# Patient Record
Sex: Female | Born: 1945 | Race: White | Hispanic: No | Marital: Married | Smoking: Never smoker
Health system: Southern US, Community
[De-identification: ages and names within clinical notes are randomized; demographics above are authoritative.]

## PROBLEM LIST (undated history)

## (undated) ENCOUNTER — Emergency Department (HOSPITAL_COMMUNITY): Admission: EM | Payer: PRIVATE HEALTH INSURANCE | Source: Home / Self Care

## (undated) DIAGNOSIS — E669 Obesity, unspecified: Secondary | ICD-10-CM

## (undated) DIAGNOSIS — I313 Pericardial effusion (noninflammatory): Secondary | ICD-10-CM

## (undated) DIAGNOSIS — R5381 Other malaise: Secondary | ICD-10-CM

## (undated) DIAGNOSIS — I10 Essential (primary) hypertension: Secondary | ICD-10-CM

## (undated) DIAGNOSIS — K219 Gastro-esophageal reflux disease without esophagitis: Secondary | ICD-10-CM

## (undated) DIAGNOSIS — M810 Age-related osteoporosis without current pathological fracture: Secondary | ICD-10-CM

## (undated) DIAGNOSIS — E039 Hypothyroidism, unspecified: Secondary | ICD-10-CM

## (undated) DIAGNOSIS — Z8709 Personal history of other diseases of the respiratory system: Secondary | ICD-10-CM

## (undated) DIAGNOSIS — C439 Malignant melanoma of skin, unspecified: Secondary | ICD-10-CM

## (undated) DIAGNOSIS — G4700 Insomnia, unspecified: Secondary | ICD-10-CM

## (undated) DIAGNOSIS — C801 Malignant (primary) neoplasm, unspecified: Secondary | ICD-10-CM

## (undated) DIAGNOSIS — E785 Hyperlipidemia, unspecified: Secondary | ICD-10-CM

## (undated) DIAGNOSIS — N8500 Endometrial hyperplasia, unspecified: Secondary | ICD-10-CM

## (undated) DIAGNOSIS — Z800 Family history of malignant neoplasm of digestive organs: Secondary | ICD-10-CM

## (undated) DIAGNOSIS — R351 Nocturia: Secondary | ICD-10-CM

## (undated) DIAGNOSIS — M62838 Other muscle spasm: Secondary | ICD-10-CM

## (undated) DIAGNOSIS — J45909 Unspecified asthma, uncomplicated: Secondary | ICD-10-CM

## (undated) DIAGNOSIS — E559 Vitamin D deficiency, unspecified: Secondary | ICD-10-CM

## (undated) DIAGNOSIS — R600 Localized edema: Secondary | ICD-10-CM

## (undated) DIAGNOSIS — M719 Bursopathy, unspecified: Secondary | ICD-10-CM

## (undated) DIAGNOSIS — R531 Weakness: Secondary | ICD-10-CM

## (undated) DIAGNOSIS — R5383 Other fatigue: Secondary | ICD-10-CM

## (undated) DIAGNOSIS — M67919 Unspecified disorder of synovium and tendon, unspecified shoulder: Secondary | ICD-10-CM

## (undated) DIAGNOSIS — J189 Pneumonia, unspecified organism: Secondary | ICD-10-CM

## (undated) DIAGNOSIS — M1990 Unspecified osteoarthritis, unspecified site: Secondary | ICD-10-CM

## (undated) DIAGNOSIS — R609 Edema, unspecified: Secondary | ICD-10-CM

## (undated) HISTORY — PX: JOINT REPLACEMENT: SHX530

## (undated) HISTORY — PX: ESOPHAGOGASTRODUODENOSCOPY: SHX1529

## (undated) HISTORY — DX: Obesity, unspecified: E66.9

## (undated) HISTORY — DX: Vitamin D deficiency, unspecified: E55.9

## (undated) HISTORY — DX: Family history of malignant neoplasm of digestive organs: Z80.0

## (undated) HISTORY — DX: Unspecified asthma, uncomplicated: J45.909

## (undated) HISTORY — DX: Other malaise: R53.81

## (undated) HISTORY — PX: TUBAL LIGATION: SHX77

## (undated) HISTORY — DX: Other fatigue: R53.83

## (undated) HISTORY — DX: Hypothyroidism, unspecified: E03.9

## (undated) HISTORY — PX: COLONOSCOPY: SHX174

## (undated) HISTORY — DX: Bursopathy, unspecified: M67.919

## (undated) HISTORY — DX: Malignant melanoma of skin, unspecified: C43.9

## (undated) HISTORY — DX: Endometrial hyperplasia, unspecified: N85.00

## (undated) HISTORY — DX: Bursopathy, unspecified: M71.9

---

## 2003-12-02 ENCOUNTER — Ambulatory Visit (HOSPITAL_BASED_OUTPATIENT_CLINIC_OR_DEPARTMENT_OTHER): Admission: RE | Admit: 2003-12-02 | Discharge: 2003-12-02 | Payer: Self-pay | Admitting: Internal Medicine

## 2004-05-17 ENCOUNTER — Ambulatory Visit: Payer: Self-pay | Admitting: Internal Medicine

## 2004-06-10 ENCOUNTER — Ambulatory Visit: Payer: Self-pay | Admitting: Obstetrics and Gynecology

## 2004-06-11 ENCOUNTER — Ambulatory Visit: Payer: Self-pay | Admitting: Internal Medicine

## 2004-06-18 ENCOUNTER — Ambulatory Visit: Payer: Self-pay | Admitting: Internal Medicine

## 2004-06-30 HISTORY — PX: MELANOMA EXCISION: SHX5266

## 2004-08-05 ENCOUNTER — Ambulatory Visit: Payer: Self-pay | Admitting: Internal Medicine

## 2004-08-13 ENCOUNTER — Ambulatory Visit: Payer: Self-pay | Admitting: Internal Medicine

## 2005-01-03 ENCOUNTER — Ambulatory Visit: Payer: Self-pay | Admitting: Internal Medicine

## 2005-01-05 ENCOUNTER — Ambulatory Visit (HOSPITAL_COMMUNITY): Admission: RE | Admit: 2005-01-05 | Discharge: 2005-01-05 | Payer: Self-pay | Admitting: Internal Medicine

## 2005-06-13 ENCOUNTER — Ambulatory Visit: Payer: Self-pay | Admitting: Internal Medicine

## 2005-06-16 ENCOUNTER — Ambulatory Visit: Payer: Self-pay | Admitting: Obstetrics and Gynecology

## 2005-06-19 ENCOUNTER — Ambulatory Visit: Payer: Self-pay | Admitting: Internal Medicine

## 2005-10-17 ENCOUNTER — Ambulatory Visit: Payer: Self-pay | Admitting: Internal Medicine

## 2005-10-17 ENCOUNTER — Ambulatory Visit (HOSPITAL_COMMUNITY): Admission: RE | Admit: 2005-10-17 | Discharge: 2005-10-17 | Payer: Self-pay | Admitting: Internal Medicine

## 2006-02-23 ENCOUNTER — Ambulatory Visit: Payer: Self-pay | Admitting: Internal Medicine

## 2006-03-03 ENCOUNTER — Ambulatory Visit (HOSPITAL_COMMUNITY): Admission: RE | Admit: 2006-03-03 | Discharge: 2006-03-03 | Payer: Self-pay | Admitting: Internal Medicine

## 2006-03-03 ENCOUNTER — Ambulatory Visit: Payer: Self-pay | Admitting: Internal Medicine

## 2006-06-30 HISTORY — PX: TUMOR EXCISION: SHX421

## 2006-07-08 ENCOUNTER — Ambulatory Visit: Payer: Self-pay | Admitting: Internal Medicine

## 2006-07-08 LAB — CONVERTED CEMR LAB
ALT: 18 units/L (ref 0–40)
AST: 16 units/L (ref 0–37)
Albumin: 3.6 g/dL (ref 3.5–5.2)
Alkaline Phosphatase: 73 units/L (ref 39–117)
BUN: 11 mg/dL (ref 6–23)
Basophils Relative: 0.9 % (ref 0.0–1.0)
Bilirubin Urine: NEGATIVE
Calcium: 9.4 mg/dL (ref 8.4–10.5)
Chloride: 104 meq/L (ref 96–112)
Chol/HDL Ratio, serum: 5.3
Cholesterol: 232 mg/dL (ref 0–200)
Crystals: NEGATIVE
GFR calc non Af Amer: 67 mL/min
Glomerular Filtration Rate, Af Am: 82 mL/min/{1.73_m2}
Glucose, Bld: 103 mg/dL — ABNORMAL HIGH (ref 70–99)
HCT: 42.7 % (ref 36.0–46.0)
Hemoglobin, Urine: NEGATIVE
MCV: 84 fL (ref 78.0–100.0)
Monocytes Relative: 8.5 % (ref 3.0–11.0)
Potassium: 3.9 meq/L (ref 3.5–5.1)
RBC: 5.08 M/uL (ref 3.87–5.11)
Sodium: 140 meq/L (ref 135–145)
Total Protein, Urine: NEGATIVE mg/dL
Urobilinogen, UA: 0.2 (ref 0.0–1.0)
WBC: 7.3 10*3/uL (ref 4.5–10.5)

## 2006-07-09 ENCOUNTER — Ambulatory Visit: Payer: Self-pay | Admitting: Obstetrics and Gynecology

## 2006-07-09 ENCOUNTER — Encounter: Payer: Self-pay | Admitting: Internal Medicine

## 2006-07-29 ENCOUNTER — Ambulatory Visit: Payer: Self-pay | Admitting: Internal Medicine

## 2006-09-04 ENCOUNTER — Encounter: Payer: Self-pay | Admitting: Internal Medicine

## 2006-09-28 ENCOUNTER — Encounter: Payer: Self-pay | Admitting: Internal Medicine

## 2006-10-13 ENCOUNTER — Ambulatory Visit: Payer: Self-pay | Admitting: Obstetrics and Gynecology

## 2006-10-13 ENCOUNTER — Encounter: Payer: Self-pay | Admitting: Internal Medicine

## 2006-10-13 ENCOUNTER — Other Ambulatory Visit: Payer: Self-pay

## 2006-10-15 ENCOUNTER — Encounter: Payer: Self-pay | Admitting: Internal Medicine

## 2006-10-16 ENCOUNTER — Encounter: Payer: Self-pay | Admitting: Internal Medicine

## 2006-10-19 ENCOUNTER — Ambulatory Visit: Payer: Self-pay | Admitting: Obstetrics and Gynecology

## 2006-10-19 ENCOUNTER — Encounter: Payer: Self-pay | Admitting: Internal Medicine

## 2006-10-27 ENCOUNTER — Encounter: Payer: Self-pay | Admitting: Internal Medicine

## 2007-01-28 ENCOUNTER — Encounter: Payer: Self-pay | Admitting: Internal Medicine

## 2007-07-01 HISTORY — PX: TUMOR REMOVAL: SHX12

## 2007-07-27 ENCOUNTER — Encounter: Payer: Self-pay | Admitting: Internal Medicine

## 2007-07-27 ENCOUNTER — Ambulatory Visit: Payer: Self-pay | Admitting: Internal Medicine

## 2007-07-27 DIAGNOSIS — E039 Hypothyroidism, unspecified: Secondary | ICD-10-CM | POA: Insufficient documentation

## 2007-07-27 DIAGNOSIS — E785 Hyperlipidemia, unspecified: Secondary | ICD-10-CM | POA: Insufficient documentation

## 2007-07-27 DIAGNOSIS — K219 Gastro-esophageal reflux disease without esophagitis: Secondary | ICD-10-CM | POA: Insufficient documentation

## 2007-07-27 DIAGNOSIS — I10 Essential (primary) hypertension: Secondary | ICD-10-CM | POA: Insufficient documentation

## 2007-07-27 LAB — CONVERTED CEMR LAB
ALT: 20 units/L (ref 0–35)
Basophils Relative: 0.3 % (ref 0.0–1.0)
Bilirubin Urine: NEGATIVE
CO2: 28 meq/L (ref 19–32)
Crystals: NEGATIVE
Direct LDL: 199.3 mg/dL
Eosinophils Absolute: 0.1 10*3/uL (ref 0.0–0.6)
GFR calc non Af Amer: 90 mL/min
Glucose, Bld: 105 mg/dL — ABNORMAL HIGH (ref 70–99)
HDL: 40.6 mg/dL (ref >39.0)
Hemoglobin, Urine: NEGATIVE
Ketones, ur: NEGATIVE mg/dL
Lymphocytes Relative: 18.6 % (ref 12.0–46.0)
MCHC: 33.4 g/dL (ref 30.0–36.0)
Monocytes Absolute: 0.8 10*3/uL — ABNORMAL HIGH (ref 0.2–0.7)
Neutro Abs: 6.1 10*3/uL (ref 1.4–7.7)
Neutrophils Relative %: 70.8 % (ref 43.0–77.0)
Nitrite: NEGATIVE
RBC / HPF: NONE SEEN
TSH: 4.97 microintl units/mL (ref 0.35–5.50)
Total Bilirubin: 0.7 mg/dL (ref 0.3–1.2)
Total Protein: 6.8 g/dL (ref 6.0–8.3)
Urine Glucose: NEGATIVE mg/dL
Urobilinogen, UA: 0.2 (ref 0.0–1.0)
WBC: 8.6 10*3/uL (ref 4.5–10.5)

## 2007-07-29 ENCOUNTER — Encounter: Payer: Self-pay | Admitting: Internal Medicine

## 2007-07-29 ENCOUNTER — Ambulatory Visit: Payer: Self-pay | Admitting: Obstetrics and Gynecology

## 2007-08-02 ENCOUNTER — Ambulatory Visit: Payer: Self-pay | Admitting: Internal Medicine

## 2007-09-09 ENCOUNTER — Ambulatory Visit: Payer: Self-pay | Admitting: Internal Medicine

## 2007-09-09 LAB — CONVERTED CEMR LAB
Albumin: 3.5 g/dL (ref 3.5–5.2)
Alkaline Phosphatase: 73 units/L (ref 39–117)
Cholesterol: 136 mg/dL (ref 0–200)
HDL: 34 mg/dL — ABNORMAL LOW (ref >39.0)
Total Bilirubin: 0.6 mg/dL (ref 0.3–1.2)
Total Protein: 7 g/dL (ref 6.0–8.3)
Triglycerides: 103 mg/dL (ref 0–149)

## 2007-09-10 ENCOUNTER — Encounter: Payer: Self-pay | Admitting: Internal Medicine

## 2008-05-01 ENCOUNTER — Telehealth: Payer: Self-pay | Admitting: Internal Medicine

## 2008-06-30 HISTORY — PX: ROTATOR CUFF REPAIR: SHX139

## 2008-07-27 ENCOUNTER — Ambulatory Visit: Payer: Self-pay | Admitting: Internal Medicine

## 2008-07-27 LAB — CONVERTED CEMR LAB
ALT: 22 units/L (ref 0–35)
AST: 17 units/L (ref 0–37)
Albumin: 3.8 g/dL (ref 3.5–5.2)
Basophils Relative: 0.6 % (ref 0.0–3.0)
Bilirubin Urine: NEGATIVE
Bilirubin, Direct: 0.1 mg/dL (ref 0.0–0.3)
CO2: 30 meq/L (ref 19–32)
Chloride: 104 meq/L (ref 96–112)
Creatinine, Ser: 0.8 mg/dL (ref 0.4–1.2)
Eosinophils Absolute: 0.2 10*3/uL (ref 0.0–0.7)
Eosinophils Relative: 1.8 % (ref 0.0–5.0)
GFR calc Af Amer: 93 mL/min
HDL: 45.7 mg/dL (ref >39.0)
Hemoglobin, Urine: NEGATIVE
Hemoglobin: 13.4 g/dL (ref 12.0–15.0)
MCHC: 34.5 g/dL (ref 30.0–36.0)
Mucus, UA: NEGATIVE
Neutrophils Relative %: 75.9 % (ref 43.0–77.0)
Platelets: 269 10*3/uL (ref 150–400)
Potassium: 4.4 meq/L (ref 3.5–5.1)
RDW: 12.2 % (ref 11.5–14.6)
Total Protein: 7 g/dL (ref 6.0–8.3)
Triglycerides: 76 mg/dL (ref 0–149)
Urine Glucose: NEGATIVE mg/dL
Urobilinogen, UA: 0.2 (ref 0.0–1.0)
VLDL: 15 mg/dL (ref 0–40)

## 2008-07-28 ENCOUNTER — Ambulatory Visit: Payer: Self-pay | Admitting: Obstetrics and Gynecology

## 2008-08-03 ENCOUNTER — Ambulatory Visit: Payer: Self-pay | Admitting: Internal Medicine

## 2008-10-11 ENCOUNTER — Telehealth: Payer: Self-pay | Admitting: Internal Medicine

## 2008-10-18 ENCOUNTER — Encounter: Payer: Self-pay | Admitting: Internal Medicine

## 2008-11-09 ENCOUNTER — Encounter: Payer: Self-pay | Admitting: Internal Medicine

## 2008-11-16 ENCOUNTER — Encounter: Payer: Self-pay | Admitting: Internal Medicine

## 2008-12-14 ENCOUNTER — Encounter: Payer: Self-pay | Admitting: Internal Medicine

## 2009-01-11 ENCOUNTER — Encounter: Payer: Self-pay | Admitting: Internal Medicine

## 2009-02-08 ENCOUNTER — Encounter: Payer: Self-pay | Admitting: Internal Medicine

## 2009-02-21 ENCOUNTER — Telehealth: Payer: Self-pay | Admitting: Internal Medicine

## 2009-04-17 ENCOUNTER — Encounter: Payer: Self-pay | Admitting: Internal Medicine

## 2009-05-01 ENCOUNTER — Encounter: Payer: Self-pay | Admitting: Internal Medicine

## 2009-05-21 ENCOUNTER — Ambulatory Visit: Payer: Self-pay | Admitting: Internal Medicine

## 2009-05-21 DIAGNOSIS — C439 Malignant melanoma of skin, unspecified: Secondary | ICD-10-CM | POA: Insufficient documentation

## 2009-05-21 DIAGNOSIS — M719 Bursopathy, unspecified: Secondary | ICD-10-CM

## 2009-05-21 DIAGNOSIS — M67919 Unspecified disorder of synovium and tendon, unspecified shoulder: Secondary | ICD-10-CM | POA: Insufficient documentation

## 2009-05-21 DIAGNOSIS — N8500 Endometrial hyperplasia, unspecified: Secondary | ICD-10-CM | POA: Insufficient documentation

## 2009-05-21 DIAGNOSIS — E559 Vitamin D deficiency, unspecified: Secondary | ICD-10-CM | POA: Insufficient documentation

## 2009-05-21 LAB — CONVERTED CEMR LAB
ALT: 20 units/L (ref 0–35)
AST: 20 units/L (ref 0–37)
Albumin: 4.3 g/dL (ref 3.5–5.2)
Basophils Absolute: 0 10*3/uL (ref 0.0–0.1)
CO2: 29 meq/L (ref 19–32)
Calcium, Total (PTH): 9.4 mg/dL (ref 8.4–10.5)
Calcium: 8.9 mg/dL (ref 8.4–10.5)
Chloride: 101 meq/L (ref 96–112)
Cholesterol: 164 mg/dL (ref 0–200)
Free T4: 0.7 ng/dL (ref 0.6–1.6)
GFR calc non Af Amer: 89.12 mL/min (ref >60)
Glucose, Bld: 85 mg/dL (ref 70–99)
HCT: 42.4 % (ref 36.0–46.0)
Lymphs Abs: 1.3 10*3/uL (ref 0.7–4.0)
MCHC: 34.4 g/dL (ref 30.0–36.0)
Neutro Abs: 6.4 10*3/uL (ref 1.4–7.7)
PTH: 46.8 pg/mL (ref 14.0–72.0)
RBC: 4.92 M/uL (ref 3.87–5.11)
RDW: 13 % (ref 11.5–14.6)
Total CHOL/HDL Ratio: 3
VLDL: 14.8 mg/dL (ref 0.0–40.0)
Vitamin B-12: 506 pg/mL (ref 211–911)

## 2009-05-28 ENCOUNTER — Telehealth: Payer: Self-pay | Admitting: Internal Medicine

## 2009-07-19 ENCOUNTER — Telehealth (INDEPENDENT_AMBULATORY_CARE_PROVIDER_SITE_OTHER): Payer: Self-pay | Admitting: *Deleted

## 2009-07-30 ENCOUNTER — Ambulatory Visit: Payer: Medicare Other | Admitting: Obstetrics and Gynecology

## 2009-07-31 ENCOUNTER — Ambulatory Visit: Payer: Self-pay | Admitting: Internal Medicine

## 2009-07-31 LAB — CONVERTED CEMR LAB
ALT: 22 units/L (ref 0–35)
Amylase: 44 units/L (ref 27–131)
BUN: 14 mg/dL (ref 6–23)
Basophils Absolute: 0 10*3/uL (ref 0.0–0.1)
Basophils Relative: 0.1 % (ref 0.0–3.0)
Bilirubin Urine: NEGATIVE
CO2: 27 meq/L (ref 19–32)
Calcium: 9.2 mg/dL (ref 8.4–10.5)
Chloride: 109 meq/L (ref 96–112)
Eosinophils Absolute: 0.2 10*3/uL (ref 0.0–0.7)
GFR calc non Af Amer: 106.41 mL/min (ref >60)
Glucose, Bld: 93 mg/dL (ref 70–99)
HCT: 42.5 % (ref 36.0–46.0)
Hemoglobin: 13.8 g/dL (ref 12.0–15.0)
Ketones, ur: NEGATIVE mg/dL
LDL Cholesterol: 80 mg/dL (ref 0–99)
Lymphocytes Relative: 16.8 % (ref 12.0–46.0)
MCHC: 32.4 g/dL (ref 30.0–36.0)
Neutro Abs: 5 10*3/uL (ref 1.4–7.7)
Neutrophils Relative %: 70.7 % (ref 43.0–77.0)
RBC: 4.92 M/uL (ref 3.87–5.11)
Specific Gravity, Urine: 1.02 (ref 1.000–1.030)
Total CK: 47 units/L (ref 7–177)
Triglycerides: 84 mg/dL (ref 0.0–149.0)
VLDL: 16.8 mg/dL (ref 0.0–40.0)

## 2009-08-01 ENCOUNTER — Encounter: Payer: Self-pay | Admitting: Internal Medicine

## 2009-08-07 ENCOUNTER — Ambulatory Visit: Payer: Self-pay | Admitting: Internal Medicine

## 2009-08-07 DIAGNOSIS — IMO0001 Reserved for inherently not codable concepts without codable children: Secondary | ICD-10-CM | POA: Insufficient documentation

## 2010-01-08 ENCOUNTER — Ambulatory Visit: Payer: Self-pay | Admitting: Internal Medicine

## 2010-01-08 DIAGNOSIS — I6529 Occlusion and stenosis of unspecified carotid artery: Secondary | ICD-10-CM | POA: Insufficient documentation

## 2010-01-09 ENCOUNTER — Telehealth (INDEPENDENT_AMBULATORY_CARE_PROVIDER_SITE_OTHER): Payer: Self-pay | Admitting: *Deleted

## 2010-01-15 ENCOUNTER — Encounter: Payer: Self-pay | Admitting: Internal Medicine

## 2010-01-17 ENCOUNTER — Encounter: Payer: Self-pay | Admitting: Internal Medicine

## 2010-01-18 ENCOUNTER — Ambulatory Visit: Payer: Self-pay

## 2010-01-18 ENCOUNTER — Encounter: Payer: Self-pay | Admitting: Internal Medicine

## 2010-01-18 ENCOUNTER — Telehealth: Payer: Self-pay | Admitting: Internal Medicine

## 2010-02-27 ENCOUNTER — Telehealth: Payer: Self-pay | Admitting: Internal Medicine

## 2010-06-30 HISTORY — PX: KNEE ARTHROSCOPY: SUR90

## 2010-08-01 NOTE — Progress Notes (Signed)
  Phone Note Other Incoming   Caller: Pt  Summary of Call: pt states cymbalta is working really well for her. Can I call this into pharm for pt. Please Advise. samples given at last ov Initial call taken by: Ami Bullins CMA,  January 18, 2010 11:37 AM  Follow-up for Phone Call        yes, cymbalta 30mg  1 by mouth once daily , #30, refill as needed.  Follow-up by: Jacques Navy MD,  January 18, 2010 2:59 PM    Prescriptions: CYMBALTA 30 MG CPEP (DULOXETINE HCL) 1 by mouth qd  #30 x 3   Entered by:   Bill Salinas CMA   Authorized by:   Jacques Navy MD   Signed by:   Bill Salinas CMA on 01/18/2010   Method used:   Electronically to        [facility].* (retail)       [address]       [place], [place]  [address]       Ph: [phone]       Fax: [phone]   RxID:   [id number]

## 2010-08-01 NOTE — Miscellaneous (Signed)
Summary: Orders Update  Clinical Lists Changes  Orders: Added new Test order of Carotid Duplex (Carotid Duplex) - Signed 

## 2010-08-01 NOTE — Assessment & Plan Note (Signed)
Summary: CPX /NWS  #   Vital Signs:  Patient profile:   65 year old female Height:      60 inches Weight:      166 pounds BMI:     32.54 O2 Sat:      97 % on Room air Temp:     97.8 degrees F oral Pulse rate:   58 / minute BP sitting:   142 / 86  (left arm) Cuff size:   regular  Vitals Entered By: Bill Salinas CMA (August 07, 2009 1:38 PM)  O2 Flow:  Room air  Vision Screening:      Vision Comments: pt had last eye exam 2 years ago  Vision Entered By: Ami Bullins CMA (August 07, 2009 1:40 PM)   Primary Care Provider:  [facility]   History of Present Illness: Presents for routine follow-up. She does report that she is having more diffuse aches and pains. She has pain in the soft tissue of the  back actross the thoracic area and in the low back.   She has  been prescribed bisphosphonate for bone loss.   She continues to have significant fatigue and malaise. Previous work up was negative and recent labs were normal.   She was unable to take phentermine due to rise in  Blood Pressure  She has seen her gynecologist and is current.   Current Medications (verified): 1)  Taztia Xt 360 Mg Xr24h-Cap (Diltiazem Hcl Er Beads) .[name].. 1 By Mouth Once Daily 2)  Temazepam 30 Mg Caps (Temazepam) .... Take 1 Tab By Mouth At Bedtime 3)  Enalapril Maleate 20 Mg  Tabs (Enalapril Maleate) .[name].. 1 By Mouth Once Daily 4)  Lasix 40 Mg  Tabs (Furosemide) .... Take 1/2 By Mouth Once Daily 5)  Zantac 150 Mg  Tabs (Ranitidine Hcl) .... As Needed 6)  Simvastatin 40 Mg  Tabs (Simvastatin) .[name].. 1 By Mouth Q Pm 7)  Excedrin Tension Headache 500-65 Mg Tabs (Acetaminophen-Caffeine) .... As Needed 8)  Meloxicam 7.5 Mg Tabs (Meloxicam) .[name].. 1 Tab Daily 9)  Centrum Silver  Tabs (Multiple Vitamins-Minerals) .[name].. 1 Daily 10)  Vitamin C 250 Mg Chew (Ascorbic Acid) .[name].. 1 Daily 11)  Vitamin E 100 Unit Caps (Vitamin E) .[name].. 1 Cap Daily 12)  Phentermine Hcl 37.5 Mg Caps (Phentermine Hcl) .[name].. 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) Hx of ROTATOR CUFF INJURY, LEFT SHOULDER (ICD-726.10) VITAMIN D DEFICIENCY (ICD-268.9) OTHER MALAISE AND FATIGUE (ICD-780.79) HYPOTHYROIDISM (ICD-244.9) MELANOMA (ICD-172.9) CARCINOMA, COLON, FAMILY HX (ICD-V16.0) HYPERTENSION (ICD-401.9) HYPERLIPIDEMIA (ICD-272.4) GERD (ICD-530.81) ROUTINE GENERAL MEDICAL EXAM@HEALTH  CARE FACL (ICD-V70.0)  Past Surgical History: Last updated: 05/21/2009 Tubal ligation Excision of a melanoma near the scapula '06 Excision of a small lesion in her right proimal upper extremity that was benign EGD (08/13/2004) excision of endometrial tumor '08 rotator cuff left - repair '10  Family History: Last updated: 08-29-2007 father - deceased @ 7: CHF, emphysema mother- deceased @ 24; HTN DJD 2 sister - deceased: heart disease in one, one of drug overdose; both had severe                 arthitis pain. 2 sisters - HTN, arthritis, one sister with thyroid problems brother - HTN, lipids GM- MI, paternal uncle with MI Strong history of CAD in paternal kinship. Negative for breast cancer, ovarian cancer.  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  Risk Factors: Caffeine Use: 0 (08/02/2007) Exercise: no (08/02/2007)  Risk Factors: Smoking Status: never (07/27/2007)  Review of Systems  The patient denies anorexia, fever, weight loss, weight gain, hoarseness, chest pain, dyspnea on exertion, peripheral edema, headaches, abdominal pain, hematochezia, incontinence, muscle weakness, suspicious skin lesions, depression, abnormal bleeding, enlarged lymph nodes, and angioedema.    Physical Exam  General:  overweight white female Head:   normocephalic, atraumatic, and no abnormalities observed.   Eyes:  vision grossly intact, pupils equal, pupils round, corneas and lenses clear, and no injection.   Ears:  External ear exam shows no significant lesions or deformities.  Otoscopic examination reveals clear canals, tympanic membranes are intact bilaterally without bulging, retraction, inflammation or discharge. Hearing is grossly normal bilaterally. Nose:  no external deformity and no external erythema.   Mouth:  Oral mucosa and oropharynx without lesions or exudates.  Teeth in good repair. Neck:  no thyromegaly and no carotid bruits.   Chest Wall:  no deformities.  absent point tenderness back and chest that would clinically indicate fibromyalgia Breasts:  deferred to gyn Lungs:  Normal respiratory effort, chest expands symmetrically. Lungs are clear to auscultation, no crackles or wheezes. Heart:  Normal rate and regular rhythm. S1 and S2 normal without gallop, murmur, click, rub or other extra sounds. Abdomen:  obese, soft, non-tender, normal bowel sounds, no rebound tenderness, and no hepatomegaly.   Genitalia:  deferred to gyn Msk:  normal ROM, no joint swelling, no joint warmth, no redness over joints, no joint deformities, and no joint instability.   Pulses:  2+ radial and DP pulses Extremities:  No clubbing, cyanosis, edema, or deformity noted with normal full range of motion of all joints.   Neurologic:  No cranial nerve deficits noted. Station and gait are normal. Plantar reflexes are down-going bilaterally. DTRs are symmetrical throughout. Sensory, motor and coordinative functions appear intact. Skin:  turgor normal, color normal, no rashes, no ecchymoses, and no ulcerations.   Cervical Nodes:  no anterior cervical adenopathy and no posterior cervical adenopathy.   Psych:  Oriented X3, memory intact for recent and remote, good eye contact, and not anxious appearing.     Impression & Recommendations:  Problem # 1:   VITAMIN D DEFICIENCY (ICD-268.9) Patient has had replacement therapy.   Recommend - daily intake of [phone] international units Vit D  Problem # 2:  Hx of ROTATOR CUFF INJURY, LEFT SHOULDER (ICD-726.10) Has made an 80% recovery - still with residual limitation in flexion. Does not appear to be limiting activities.   Problem # 3:  HYPOTHYROIDISM (ICD-244.9) No laboratory evidence of hhpothyroidism or need for medication. this would not be an explanation for her generalized weakness and diffuse musculat discomfort.  Problem # 4:  MELANOMA (ICD-172.9) s/p excision of melanoma. She has had recent full body skin exam by her [place] dermatologist.  Problem # 5:  HYPERTENSION (ICD-401.9)  Her updated medication list for this problem includes:    Taztia Xt 360 Mg Xr24h-cap (Diltiazem hcl er beads) .[name]... 1 by mouth once daily    Enalapril Maleate 20 Mg Tabs (Enalapril maleate) .[name]... 1 by mouth once daily    Lasix 40 Mg Tabs (Furosemide) .[name]... Take 1/2 by mouth once daily  BP today: 142/86 Prior BP: 144/90 (05/21/2009)  Borderline control on her three drug regimen.  Plan - home monitoring with report back. If SBP continues to run over 140 a majority of time will need to adjust medications.  Problem # 6:  HYPERLIPIDEMIA (ICD-272.4)  Her updated medication list for this problem includes:    Simvastatin 40 Mg Tabs (Simvastatin) .[name]... 1 by mouth q pm  Lipid panel reveals good control on simvastatin.   Plan - continue medication.   Problem # 7:  MYALGIA (ICD-729.1)  Persistent malaise and fatigue with normal exams and lab. Also with difuse muscle pain ,including lower extremities. she reports that at times she is too weak to complete household chores. Recent lab work up, including thyroid function was normal. Total creatinine Kinase (CK) was 47. Exam was negative for characteristic distribution of tenderness associated with fibromyalgia.  Plan - d/c meloxicam as being ineffective            trial of gabapentin - starting at low dose and titrating up to at least 400 - 600 mg a day           OK for limited small doses of hydorcodone/APAP 5/325 1/2 to 1 tab as needed  The following medications were removed from the medication list:    Meloxicam 7.5 Mg Tabs (Meloxicam) .[name]... 1 tab daily Her updated medication list for this problem includes:    Excedrin Tension Headache 500-65 Mg Tabs (Acetaminophen-caffeine) .[name]... As needed  Orders: Prescription Created Electronically [phone])  Problem # 8:  Preventive Health Care (ICD-V70.0) Lab results generally normal. Exam remarkable for weight but otherwise normal.  She is current with her gynecologist. She is current with breast and colorectal cancer screening. Her immunizations are brought up to date today with tetnus and pneumonia vaccine.  Weight management - a major problem: plan - smart food choice in existing food preferences (take the low fat option); portion size control - reducing total daily calorie intake; gentle regular exercise. Target weight - 130 lbs; GOAL to loose 1-2 lbs/month = 24-36 months to goal.  In summary - a very nice woman whose main issues are fatigue and malaise, weight management. Her other medical problems are stable. She is asked to return n 3-4 months for follow-up of active issues.  Complete Medication List: 1)  Taztia Xt 360 Mg Xr24h-cap (Diltiazem hcl er beads) .[name].. 1 by mouth once daily 2)  Temazepam 30 Mg Caps (Temazepam) .... Take 1 tab by mouth at bedtime 3)  Enalapril Maleate 20 Mg Tabs (Enalapril maleate) .[name].. 1 by mouth once daily 4)  Lasix 40 Mg Tabs (Furosemide) .... Take 1/2 by mouth once daily 5)  Zantac 150 Mg Tabs (Ranitidine hcl) .... As needed 6)  Simvastatin 40 Mg Tabs (Simvastatin) .[name].. 1 by mouth q pm 7)  Excedrin Tension Headache 500-65 Mg Tabs (Acetaminophen-caffeine) .... As needed 8)  Centrum Silver Tabs (Multiple vitamins-minerals) .[name].. 1 daily 9)  Vitamin C 250 Mg Chew (Ascorbic  acid) .[name].. 1 daily 10)  Vitamin E 100 Unit Caps (Vitamin e) .[name].. 1 cap daily 11)  Gabapentin 100 Mg Caps (Gabapentin) .... 100mg  once daily x 3, 100mg  two times a day x 3, 2x100mg  two times a day.  Other Orders: TD Toxoids IM 7 YR + ([address]) Pneumococcal Vaccine (30865) Admin 1st Vaccine (78469) Admin of Any Addtl Vaccine (62952)  Patient: Michelle [name] Note: All result statuses are Final unless otherwise noted.  Tests: (1) BMP (METABOL)   Sodium  143 mEq/L                   135-145   Potassium                 4.6 mEq/L                   3.5-5.1   Chloride                  109 mEq/L                   96-112   Carbon Dioxide            27 mEq/L                    19-32   Glucose                   93 mg/dL                    16-10   BUN                       14 mg/dL                    9-60   Creatinine                0.6 mg/dL                   4.5-4.0   Calcium                   9.2 mg/dL                   9.8-11.9   GFR                       106.41 mL/min               >60  Tests: (2) Lipid Panel (LIPID)   Cholesterol               140 mg/dL                   1-478     ATP III Classification            Desirable:  < 200 mg/dL                    Borderline High:  200 - 239 mg/dL               High:  > = 240 mg/dL   Triglycerides             84.0 mg/dL                  2.9-562.1     Normal:  <150 mg/dL     Borderline High:  308 - 199 mg/dL   HDL                       65.78 mg/dL                 >46.96   VLDL Cholesterol          16.8 mg/dL                  2.9-52.8   LDL Cholesterol  80 mg/dL                    1-61  CHO/HDL Ratio:  CHD Risk                             3                    Men          Women     1/2 Average Risk     3.4          3.3     Average Risk          5.0          4.4     2X Average Risk          9.6          7.1     3X Average Risk          15.0          11.0                           Tests: (3) CBC Platelet w/Diff (CBCD)    White Cell Count          7.1 K/uL                    4.5-10.5   Red Cell Count            4.92 Mil/uL                 3.87-5.11   Hemoglobin                13.8 g/dL                   09.6-04.5   Hematocrit                42.5 %                      36.0-46.0   MCV                       86.4 fl                     78.0-100.0   MCHC                      32.4 g/dL                   40.9-81.1   RDW                       12.6 %                      11.5-14.6   Platelet Count            276.0 K/uL                  150.0-400.0   Neutrophil %              70.7 %                      43.0-77.0   Lymphocyte %  16.8 %                      12.0-46.0   Monocyte %                9.9 %                       3.0-12.0   Eosinophils%              2.5 %                       0.0-5.0   Basophils %               0.1 %                       0.0-3.0   Neutrophill Absolute      5.0 K/uL                    1.4-7.7   Lymphocyte Absolute       1.2 K/uL                    0.7-4.0   Monocyte Absolute         0.7 K/uL                    0.1-1.0  Eosinophils, Absolute                             0.2 K/uL                    0.0-0.7   Basophils Absolute        0.0 K/uL                    0.0-0.1  Tests: (4) Hepatic/Liver Function Panel (HEPATIC)   Total Bilirubin           0.5 mg/dL                   1.6-1.0   Direct Bilirubin          0.1 mg/dL                   9.6-0.4   Alkaline Phosphatase      63 U/L                      39-117   AST                       18 U/L                      0-37   ALT                       22 U/L                      0-35   Total Protein             7.0 g/dL                    5.4-0.9   Albumin  3.8 g/dL                    1.6-1.0  Tests: (5) TSH (TSH)   FastTSH                   2.58 uIU/mL                 0.35-5.50  Tests: (6) Creatine Kinase (CK)   Creatine Kinase           47 U/L                      7-177  Tests: (7) UDip w/Micro (URINE)   Color                      LT. YELLOW       RANGE:  Yellow;Lt. Yellow   Clarity                   CLEAR                       Clear   Specific Gravity          1.020                       1.000 - 1.030   Urine Ph                  6.0                         5.0-8.0   Protein                   NEGATIVE                    Negative   Urine Glucose             NEGATIVE                    Negative   Ketones                   NEGATIVE                    Negative   Urine Bilirubin           NEGATIVE                    Negative   Blood                     NEGATIVE                    Negative   Urobilinogen              0.2                         0.0 - 1.0   Leukocyte Esterace        MODERATE                    Negative   Nitrite                   NEGATIVE  Negative   Urine WBC                 11-20/hpf                   0-2/hpf     Results faxed to site/floor on 07/31/2009 2:30 PM by Luellen Pucker.   Urine Epith               Many(>10/hpf)               Rare(0-4/hpf)   Urine Bacteria            Few(10-50/hpf)              None Tests: (1) Amylase (AMYL)   Amylase                   44 U/L                      27-131  Tests: (2) Lipase (LIPASE)   Lipase                    30.0 U/L                    11.0-59.0Prescriptions: TEMAZEPAM 30 MG CAPS (TEMAZEPAM) Take 1 tab by mouth at bedtime  #30 x 5   Entered and Authorized by:   Jacques Navy MD   Signed by:   Jacques Navy MD on 08/07/2009   Method used:   Handwritten   RxID:   [id number] SIMVASTATIN 40 MG  TABS (SIMVASTATIN) 1 by mouth q PM  #90 x 3   Entered and Authorized by:   Jacques Navy MD   Signed by:   Jacques Navy MD on 08/07/2009   Method used:   Electronically to        [facility].* (retail)       [address]       [place], [place]  [address]       Ph: [phone]       Fax: [phone]   RxID:   [id number] ZANTAC 150 MG  TABS (RANITIDINE HCL) as needed   #180 x 3   Entered and Authorized by:   Jacques Navy MD   Signed by:   Jacques Navy MD on 08/07/2009   Method used:   Electronically to        [facility].* (retail)       [address]       [place], [place]  [address]       Ph: [phone]       Fax: [phone]   RxID:   [id number] LASIX 40 MG  TABS (FUROSEMIDE) take 1/2 by mouth once daily  #90 x 3   Entered and Authorized by:   Jacques Navy MD   Signed by:   Jacques Navy MD on 08/07/2009   Method used:   Electronically to        [facility].* (retail)       [address]       [place], [place]  [address]       Ph: [phone]       Fax: [phone]   RxID:  [id number] ENALAPRIL MALEATE 20 MG  TABS (ENALAPRIL MALEATE) 1 by mouth once daily  #90 x 3   Entered and Authorized by:   Jacques Navy MD   Signed by:   Jacques Navy MD on 08/07/2009   Method used:   Electronically to        [facility].* (retail)       [address]       [place], [place]  [address]       Ph: [phone]       Fax: [phone]   RxID:   [id number] TAZTIA XT 360 MG XR24H-CAP (DILTIAZEM HCL ER BEADS) 1 by mouth once daily  #90 x 3   Entered and Authorized by:   Jacques Navy MD   Signed by:   Jacques Navy MD on 08/07/2009   Method used:   Electronically to        [facility] (retail)       [address]       [place], [place]  [address]       Ph: [phone]       Fax: [phone]   RxID:   [id number] GABAPENTIN 100 MG CAPS (GABAPENTIN) 100mg  once daily x 3, 100mg  two times a day x 3, 2x100mg  two times a day.  #60 x 0   Entered and Authorized by:   Jacques Navy MD   Signed by:   Jacques Navy MD on 08/07/2009   Method used:   Electronically to        [facility].* (retail)       [address]       [place], [place]  [address]       Ph: [phone]       Fax: [phone]   RxID:   [id number]    Preventive Care Screening  Mammogram:    Date:  07/30/2009    Results:  normal   Last Flu Shot:    Date:  04/03/2009    Results:  given     Immunizations Administered:  Tetanus Vaccine:    Vaccine Type: Td    Site: right deltoid    Mfr: Sanofi Pasteur    Dose: 0.5 ml    Route: IM    Given by: Ami Bullins CMA    Exp. Date: 10/11/2010    Lot #: [id number]    VIS given: 05/18/07 version given August 07, 2009.  Pneumonia Vaccine:    Vaccine Type: Pneumovax    Site: left deltoid    Mfr: Merck    Dose: 0.5 ml    Route: IM    Given by: Ami Bullins CMA    Exp. Date: 07/27/2010    Lot #: [id number]    VIS given: 01/26/96 version given August 07, 2009.

## 2010-08-01 NOTE — Letter (Signed)
[facility]  [facility]   Imported By: Lester Dovray 08/09/2009 09:03:40  _____________________________________________________________________  External Attachment:    Type:   Image     Comment:   External Document

## 2010-08-01 NOTE — Assessment & Plan Note (Signed)
Summary: rx consult and knot on right thumb-lb   Vital Signs:  Patient profile:   65 year old female Height:      60 inches Weight:      168 pounds BMI:     32.93 O2 Sat:      97 % on Room air Temp:     98.1 degrees F oral Pulse rate:   67 / minute BP sitting:   138 / 88  (left arm) Cuff size:   regular  Vitals Entered By: Bill Salinas CMA (January 08, 2010 3:54 PM)  O2 Flow:  Room air CC: pt here for evaluation of knot near her right thumb and pt would also like to discuss alternative to Gabapentin/ ab   Primary Care Provider:  Divit Stipp  CC:  pt here for evaluation of knot near her right thumb and pt would also like to discuss alternative to Gabapentin/ ab.  History of Present Illness: Pt presents for evaluation of sa nodule at the base of her right thumb. This has enlarged and causes discomfort when she grips.  She c/o feeling terrible: low energy, anhedonia, short-temper. She also has diffuse discomfort.  Current Medications (verified): 1)  Taztia Xt 360 Mg Xr24h-Cap (Diltiazem Hcl Er Beads) .[name].. 1 By Mouth Once Daily 2)  Temazepam 30 Mg Caps (Temazepam) .... Take 1 Tab By Mouth At Bedtime 3)  Enalapril Maleate 20 Mg  Tabs (Enalapril Maleate) .[name].. 1 By Mouth Once Daily 4)  Lasix 40 Mg  Tabs (Furosemide) .... Take 1/2 By Mouth Once Daily 5)  Zantac 150 Mg  Tabs (Ranitidine Hcl) .... As Needed 6)  Simvastatin 40 Mg  Tabs (Simvastatin) .[name].. 1 By Mouth Q Pm 7)  Excedrin Tension Headache 500-65 Mg Tabs (Acetaminophen-Caffeine) .... As Needed 8)  Centrum Silver  Tabs (Multiple Vitamins-Minerals) .[name].. 1 Daily 9)  Vitamin C 250 Mg Chew (Ascorbic Acid) .[name].. 1 Daily 10)  Vitamin E 100 Unit Caps (Vitamin E) .[name].. 1 Cap Daily 11)  Gabapentin 100 Mg Caps (Gabapentin) .... 100mg  Once Daily X 3, 100mg  Two Times A Day X 3, 2x100mg  Two Times A Day. 12)  Alendronate Sodium 70 Mg Tabs (Alendronate Sodium) .[name].. 1 Q Week 13)  Mimvey 1-0.5 Mg Tabs (Estradiol-Norethindrone Acet) .[name].. 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) Hx of ROTATOR CUFF INJURY, LEFT SHOULDER (ICD-726.10) VITAMIN D DEFICIENCY (ICD-268.9) OTHER MALAISE AND FATIGUE (ICD-780.79) HYPOTHYROIDISM (ICD-244.9) MELANOMA (ICD-172.9) CARCINOMA, COLON, FAMILY HX (ICD-V16.0) HYPERTENSION (ICD-401.9) HYPERLIPIDEMIA (ICD-272.4) GERD (ICD-530.81) ROUTINE GENERAL MEDICAL EXAM@HEALTH  CARE FACL (ICD-V70.0)  Past Surgical History: Last updated: 05/21/2009 Tubal ligation Excision of a melanoma near the scapula '06 Excision of a small lesion in her right proimal upper extremity that was benign EGD (08/13/2004) excision of endometrial tumor '08 rotator cuff left - repair '10  Family History: Last updated: 08-04-2007 father - deceased @ 30: CHF, emphysema mother- deceased @ 37; HTN DJD 2 sister - deceased: heart disease in one, one of drug overdose; both had severe                 arthitis pain. 2 sisters - HTN, arthritis, one sister with thyroid problems brother - HTN, lipids GM- MI, paternal uncle with MI Strong history of CAD in paternal kinship. Negative for breast cancer, ovarian cancer.  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  Review of Systems  The patient denies anorexia, fever, weight loss, weight gain, hoarseness, chest pain, syncope, peripheral edema, prolonged cough, headaches, abdominal pain, hematochezia, hematuria, muscle weakness, and difficulty walking.    Physical Exam  General:  overweight white female in no acute distress Head:  normocephalic and atraumatic.   Eyes:  pupils equal and pupils round.   Neck:  supple.   Chest Wall:  no point tenderness along the back or chest wall. Lungs:  normal respiratory  effort and normal breath sounds.   Heart:  normal rate and regular rhythm.   Abdomen:  obese Msk:  1cm cystic structure at the base of the right thumb. No hot.It is mobile and mildly tender to pressure. Neurologic:  alert & oriented X3 and cranial nerves II-XII intact.   Skin:  turgor normal and color normal.   Psych:  normally interactive, good eye contact, and slightly anxious.     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  Orders: Orthopedic Surgeon Referral (Ortho Surgeon)  Problem # 2:  CAROTID ARTERY DISEASE (ICD-433.10) Life scan with positive doppler.  Plan - definitive doppler carotids  Orders: Cardiology Referral (Cardiology)  Problem # 3:  MYALGIA (ICD-729.1) On-going problem - question of fibromyalgia or depression.  Plan - trail of cymbalta 30mg , may increase to 60mg  if helpful but not quite enough.  Her updated medication list for this problem includes:    Excedrin Tension Headache 500-65 Mg Tabs (Acetaminophen-caffeine) .[name]... As needed    Hydrocodone-acetaminophen 5-325 Mg Tabs (Hydrocodone-acetaminophen) .[name]... Take as needed.  Complete Medication List: 1)  Taztia Xt 360 Mg Xr24h-cap (Diltiazem hcl er beads) .[name].. 1 by mouth once daily 2)  Temazepam 30 Mg Caps (Temazepam) .... Take 1 tab by mouth at bedtime 3)  Enalapril Maleate 20 Mg Tabs (Enalapril maleate) .[name].. 1 by mouth once daily 4)  Lasix 40 Mg Tabs (Furosemide) .... Take 1/2 by mouth once daily 5)  Zantac 150 Mg Tabs (Ranitidine hcl) .... As needed 6)  Simvastatin 40 Mg Tabs (Simvastatin) .[name].. 1 by mouth q pm 7)  Excedrin Tension Headache 500-65 Mg Tabs (Acetaminophen-caffeine) .... As needed 8)  Centrum Silver Tabs (Multiple vitamins-minerals) .[name].. 1 daily 9)  Vitamin C 250 Mg Chew (Ascorbic acid) .[name].. 1 daily 10)  Vitamin E 100 Unit Caps (Vitamin e) .[name].. 1 cap  daily 11)  Alendronate Sodium 70 Mg Tabs (Alendronate sodium) .[name].. 1 q week 12)  Mimvey 1-0.5 Mg Tabs (Estradiol-norethindrone acet) .[name].. 1 once daily 13)  Glucosamine-chondroitin 500-400 Mg Caps (Glucosamine-chondroitin) .... 2 by mouth once daily for joints 14)  Cymbalta 30 Mg Cpep (Duloxetine hcl) .[name].. 1 by mouth qd 15)  Hydrocodone-acetaminophen 5-325 Mg Tabs (Hydrocodone-acetaminophen) .... Take as needed.  Patient Instructions: 1)  Synovial cyst base of right thumb - plan is [facility] ( Drs Merlyn Lot, Sypher and [place]). 2)  Carotid artery disease - due to the "Life-Scan" report will have you get a formal carotid doppler study at [facility] 3)  Fatigue and muscle pain - reviewed labs back to Nov '10 - everything, including B12, normal. Plan - trial of cymbalta 30mg  once a day. If this helps but not enough we can increase to 60mg  once a day. this is for possible Fibromyalgia.  4)  General pain - ok to take up to 3000mg  per day of tylenol; ok to take up to 6 anacin daily; ok to use hydrocodone/APAP 5/325 as needed. CAUTION; this combination includes tylenol and would be part of the 3000mg  limit per day.  Prescriptions: HYDROCODONE-ACETAMINOPHEN 5-325 MG TABS (HYDROCODONE-ACETAMINOPHEN) take as needed.  #60 x 1   Entered and Authorized by:   Jacques Navy MD   Signed by:   Jacques Navy MD on 01/08/2010   Method used:   Print then Give to Patient   RxID:   [id number]

## 2010-08-01 NOTE — Progress Notes (Signed)
Summary: [facility] refills  Phone Note Refill Request Message from:  Fax from Pharmacy on February 27, 2010 4:02 PM  Refills Requested: Medication #1:  ENALAPRIL MALEATE 20 MG  TABS 1 by mouth once daily   Supply Requested: 90  Medication #2:  TEMAZEPAM 30 MG CAPS Take 1 tab by mouth at bedtime   Supply Requested: 90  Medication #3:  TAZTIA XT 360 MG XR24H-CAP 1 by mouth once daily   Supply Requested: 90 90 days supply requested---[facility]. Please advise.  Initial call taken by: Lucious Groves CMA,  February 27, 2010 4:02 PM  Follow-up for Phone Call        OK for refill as needed  Follow-up by: Jacques Navy MD,  February 28, 2010 8:02 AM    Prescriptions: TEMAZEPAM 30 MG CAPS (TEMAZEPAM) Take 1 tab by mouth at bedtime  #90 x 3   Entered by:   Ami Bullins CMA   Authorized by:   Jacques Navy MD   Signed by:   Bill Salinas CMA on 03/05/2010   Method used:   Printed then faxed to ...       [facility] [address]* (retail)       [address]       [place], [place]  [address]       Ph: [phone]       Fax: [phone]   RxID:   [id number] TAZTIA XT 360 MG XR24H-CAP (DILTIAZEM HCL ER BEADS) 1 by mouth once daily  #90 x 3   Entered by:   Ami Bullins CMA   Authorized by:   Jacques Navy MD   Signed by:   Bill Salinas CMA on 03/05/2010   Method used:   Printed then faxed to ...       [facility] [address]* (retail)       [address]       [place], [place]  [address]       Ph: [phone]       Fax: [phone]   RxID:   [id number]

## 2010-08-01 NOTE — Consult Note (Signed)
[facility]: [facility]   Imported By: Lester Charlotte Hall 01/28/2010 09:42:57  _____________________________________________________________________  External Attachment:    Type:   Image     Comment:   External Document

## 2010-08-01 NOTE — Progress Notes (Signed)
  Phone Note Call from Patient   Summary of Call: Is coming Feb 8 for a physical and would like to know if a muscle enzyme can be added to labs? Please advise? Can leave message on answering machine at [phone]. Initial call taken by: Josph Macho CMA,  July 19, 2009 9:30 AM  Follow-up for Phone Call        OK for CK total  995.20 in addition to her routine labs Follow-up by: Jacques Navy MD,  July 19, 2009 12:48 PM  Additional Follow-up for Phone Call Additional follow up Details #1::        Added lab and informed pt that this would be checked. Additional Follow-up by: Josph Macho CMA,  July 19, 2009 1:10 PM

## 2010-08-01 NOTE — Progress Notes (Signed)
----   Converted from flag ---- ---- 01/09/2010 12:46 PM, Edman Circle wrote: appt 7/22 @ 4:00  ---- 01/09/2010 12:34 PM, Dagoberto Reef wrote: Thanks  ---- 01/09/2010 8:07 AM, Dagoberto Reef wrote:   ---- 01/08/2010 4:54 PM, Jacques Navy MD wrote: The following orders have been entered for this patient and placed on Admin Hold:  Type:     Referral       Code:   Ortho Surgeon Description:   Orthopedic Surgeon Referral Order Date:   01/08/2010   Authorized By:   Jacques Navy MD Order #:   [id number] Clinical Notes:   [facility] to [facility] for evaluation and treatment of a synovial cyst base of right thumb - interfering with patients use of her right hand. ------------------------------

## 2010-08-06 ENCOUNTER — Other Ambulatory Visit: Payer: Self-pay

## 2010-08-12 ENCOUNTER — Ambulatory Visit: Payer: Medicare Other | Admitting: Obstetrics and Gynecology

## 2010-08-14 ENCOUNTER — Other Ambulatory Visit: Payer: Self-pay | Admitting: Internal Medicine

## 2010-08-14 ENCOUNTER — Encounter (INDEPENDENT_AMBULATORY_CARE_PROVIDER_SITE_OTHER): Payer: Self-pay | Admitting: *Deleted

## 2010-08-14 ENCOUNTER — Other Ambulatory Visit: Payer: PRIVATE HEALTH INSURANCE

## 2010-08-14 DIAGNOSIS — Z0000 Encounter for general adult medical examination without abnormal findings: Secondary | ICD-10-CM

## 2010-08-14 DIAGNOSIS — E785 Hyperlipidemia, unspecified: Secondary | ICD-10-CM

## 2010-08-14 LAB — BASIC METABOLIC PANEL
Creatinine, Ser: 0.8 mg/dL (ref 0.4–1.2)
GFR: 80.75 mL/min (ref >60.00)
Sodium: 141 mEq/L (ref 135–145)

## 2010-08-14 LAB — CBC WITH DIFFERENTIAL/PLATELET
Basophils Relative: 0.2 % (ref 0.0–3.0)
Eosinophils Absolute: 0 10*3/uL (ref 0.0–0.7)
Eosinophils Relative: 0.1 % (ref 0.0–5.0)
HCT: 42.2 % (ref 36.0–46.0)
Hemoglobin: 14.2 g/dL (ref 12.0–15.0)
Lymphocytes Relative: 15 % (ref 12.0–46.0)
Lymphs Abs: 2.4 10*3/uL (ref 0.7–4.0)
MCV: 84.7 fl (ref 78.0–100.0)
Monocytes Absolute: 1.1 10*3/uL — ABNORMAL HIGH (ref 0.1–1.0)
Neutro Abs: 12.2 10*3/uL — ABNORMAL HIGH (ref 1.4–7.7)
Platelets: 336 10*3/uL (ref 150.0–400.0)
RBC: 4.99 Mil/uL (ref 3.87–5.11)
WBC: 15.7 10*3/uL — ABNORMAL HIGH (ref 4.5–10.5)

## 2010-08-14 LAB — HEPATIC FUNCTION PANEL
AST: 21 U/L (ref 0–37)
Albumin: 4.1 g/dL (ref 3.5–5.2)
Bilirubin, Direct: 0.1 mg/dL (ref 0.0–0.3)

## 2010-08-14 LAB — LIPID PANEL
Cholesterol: 284 mg/dL — ABNORMAL HIGH (ref 0–200)
HDL: 78.9 mg/dL (ref >39.00)
Total CHOL/HDL Ratio: 4
Triglycerides: 76 mg/dL (ref 0.0–149.0)

## 2010-08-14 LAB — URINALYSIS
Bilirubin Urine: NEGATIVE
Ketones, ur: NEGATIVE
Urine Glucose: NEGATIVE
pH: 6.5 (ref 5.0–8.0)

## 2010-08-14 LAB — TSH: TSH: 2.23 u[IU]/mL (ref 0.35–5.50)

## 2010-08-20 ENCOUNTER — Encounter: Payer: Self-pay | Admitting: Internal Medicine

## 2010-08-20 ENCOUNTER — Encounter (INDEPENDENT_AMBULATORY_CARE_PROVIDER_SITE_OTHER): Payer: PRIVATE HEALTH INSURANCE | Admitting: Internal Medicine

## 2010-08-20 DIAGNOSIS — E785 Hyperlipidemia, unspecified: Secondary | ICD-10-CM

## 2010-08-20 DIAGNOSIS — Z23 Encounter for immunization: Secondary | ICD-10-CM

## 2010-08-20 DIAGNOSIS — Z2911 Encounter for prophylactic immunotherapy for respiratory syncytial virus (RSV): Secondary | ICD-10-CM

## 2010-08-20 DIAGNOSIS — I10 Essential (primary) hypertension: Secondary | ICD-10-CM

## 2010-08-20 DIAGNOSIS — Z0000 Encounter for general adult medical examination without abnormal findings: Secondary | ICD-10-CM

## 2010-08-20 DIAGNOSIS — IMO0001 Reserved for inherently not codable concepts without codable children: Secondary | ICD-10-CM

## 2010-08-20 DIAGNOSIS — I6529 Occlusion and stenosis of unspecified carotid artery: Secondary | ICD-10-CM

## 2010-08-27 NOTE — Assessment & Plan Note (Signed)
Summary: CPX BCBS  #  CD   Vital Signs:  Patient profile:   65 year old female Height:      60 inches Weight:      172 pounds BMI:     33.71 O2 Sat:      97 % on Room air Temp:     97.4 degrees F oral Pulse rate:   63 / minute BP sitting:   110 / 62  (left arm) Cuff size:   regular  Vitals Entered By: Bill Salinas CMA (August 20, 2010 1:44 PM)  O2 Flow:  Room air CC: pt here for cpx, pt states she no longer takes mimvey/ ab  Vision Screening:      Vision Comments: eye exam due   Primary Care Provider:  Genavie [name]  CC:  pt here for cpx and pt states she no longer takes mimvey/ ab.  History of Present Illness: [name] presents for routine medical follow-up. she has had bronchitis since returning for [place]. She was started on prednisone burst and taper, and proair inhaler. Her wheezing and coughing has improved.   Fibromyalgia - pain is much better on cymbalta.  She has not been taking her simvastatin - cholesterol is very high.  She has been to her gynecologist: normal exam, normal breast exam, normal mammogram, normal bone density study was normal. Vit D was low and she is on a second round of replacement dosing. she did not take maintenance dosing after her first round of replacement.   Has been to dermatologist for routine exam - no suspicious lesions.   She is 100% independent in all ADLs. No falls are reported and there is no increased fall risk. Cognitive function remains normal as she interacts with family and community. No significant active depression.   Preventive Screening-Counseling & Management  Alcohol-Tobacco     Alcohol drinks/day: <1     Alcohol type: wine, beer     Smoking Status: never  Caffeine-Diet-Exercise     Caffeine use/day: 2 cups per day     Does Patient Exercise: no  Hep-HIV-STD-Contraception     Hepatitis Risk: no risk noted     HIV Risk: no risk noted     STD Risk: no risk noted     Dental Visit-last 6 months yes     Sun  Exposure-Excessive: no  Safety-Violence-Falls     Seat Belt Use: yes     Helmet Use: n/a     Firearms in the Home: firearms in the home     Smoke Detectors: yes     Violence in the Home: no risk noted     Sexual Abuse: no     Fall Risk: low fall risk      Sexual History:  currently monogamous.        Drug Use:  never.        Blood Transfusions:  no.    Current Medications (verified): 1)  Taztia Xt 360 Mg Xr24h-Cap (Diltiazem Hcl Er Beads) .[name].. 1 By Mouth Once Daily 2)  Temazepam 30 Mg Caps (Temazepam) .... Take 1 Tab By Mouth At Bedtime 3)  Enalapril Maleate 20 Mg  Tabs (Enalapril Maleate) .[name].. 1 By Mouth Once Daily 4)  Lasix 40 Mg  Tabs (Furosemide) .... Take 1/2 By Mouth Once Daily 5)  Zantac 150 Mg  Tabs (Ranitidine Hcl) .... As Needed 6)  Simvastatin 40 Mg  Tabs (Simvastatin) .[name].. 1 By Mouth Q Pm 7)  Excedrin Tension Headache 500-65 Mg Tabs (  Acetaminophen-Caffeine) .... As Needed 8)  Centrum Silver  Tabs (Multiple Vitamins-Minerals) .[name].. 1 Daily 9)  Vitamin C 250 Mg Chew (Ascorbic Acid) .[name].. 1 Daily 10)  Vitamin E 100 Unit Caps (Vitamin E) .[name].. 1 Cap Daily 11)  Alendronate Sodium 70 Mg Tabs (Alendronate Sodium) .[name].. 1 Q Week 12)  Mimvey 1-0.5 Mg Tabs (Estradiol-Norethindrone Acet) .[name].. 1 Once Daily 13)  Glucosamine-Chondroitin 500-400 Mg Caps (Glucosamine-Chondroitin) .... 2 By Mouth Once Daily For Joints 14)  Cymbalta 30 Mg Cpep (Duloxetine Hcl) .[name].. 1 By Mouth Qd 15)  Hydrocodone-Acetaminophen 5-325 Mg Tabs (Hydrocodone-Acetaminophen) .... Take As Needed.  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 father - deceased @ 15: CHF, emphysema mother- deceased @ 40; HTN DJD 2 sister - deceased: heart disease in one, one of drug overdose; both had severe                 arthitis pain. 2 sisters - HTN, arthritis, one sister with thyroid problems brother - HTN, lipids GM- MI, paternal uncle with MI Strong history of CAD in paternal kinship. Negative for breast cancer, ovarian cancer.  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  Caffeine use/day:  2 cups per day Dental Care w/in 6 mos.:  yes Sun Exposure-Excessive:  no Seat Belt Use:  yes Fall Risk:  low fall risk Blood Transfusions:  no Hepatitis Risk:  no risk noted HIV Risk:  no risk noted STD Risk:  no risk noted Sexual History:  currently monogamous Drug Use:  never  Review of Systems  The patient denies fever, weight loss, weight gain, vision loss, decreased hearing, dyspnea on exertion, headaches, abdominal pain, severe indigestion/heartburn, muscle weakness, suspicious skin lesions, difficulty walking, unusual weight change, abnormal bleeding, angioedema, and breast masses.    Physical Exam  General:  overweight white female Head:  Normocephalic and atraumatic without obvious abnormalities. No apparent alopecia or balding. Eyes:  vision grossly intact, pupils equal, pupils round, corneas and lenses clear, and no injection.   Ears:  External ear exam shows no significant lesions or deformities.  Otoscopic examination reveals clear canals, tympanic membranes are intact bilaterally without bulging, retraction, inflammation or discharge. Hearing is grossly normal bilaterally. Nose:  no external deformity and no external erythema.   Mouth:  Oral mucosa and oropharynx without lesions or  exudates.  Teeth in good repair. Neck:  supple, full ROM, no thyromegaly, and no carotid bruits.   Chest Wall:  no deformities and no tenderness.   Breasts:  deferred to gyn Lungs:  Normal respiratory effort, chest expands symmetrically. Lungs are clear to auscultation, no crackles or wheezes. Heart:  Normal rate and regular rhythm. S1 and S2 normal without gallop, murmur, click, rub or other extra sounds. Abdomen:  soft, non-tender, normal bowel sounds, no guarding, and no hepatomegaly.   Genitalia:  deferred to gyn Msk:  normal ROM, no joint tenderness, no joint swelling, no joint warmth, no redness over joints, and no joint instability.   Pulses:  2+ radial and DP pulses Extremities:  No clubbing, cyanosis, edema, or deformity noted with normal full range of motion of all joints.   Neurologic:  alert & oriented X3, cranial nerves II-XII intact, strength normal in all extremities, gait normal, and DTRs symmetrical and normal.   Skin:  turgor normal, color normal, no suspicious lesions, and no ulcerations.   Cervical Nodes:  no anterior cervical adenopathy and no posterior cervical adenopathy.   Psych:  Oriented X3, memory intact for recent and remote, normally interactive, and good eye contact.     Impression & Recommendations:  Problem # 1:  CAROTID ARTERY DISEASE (ICD-433.10)  EMR searched with no recent carotid doppler.  Plan - will schedule carotid doppler for follow-up.  Orders: Cardiology Referral (Cardiology)  Problem # 2:  Hx of ROTATOR CUFF INJURY, LEFT SHOULDER (ICD-726.10) Doing fairly well with adequate range of motion to carry out all activities of daily living  Problem # 3:  VITAMIN D DEFICIENCY (ICD-268.9) She has been followed by her gynecologist. She is completing a second course of Vit D replacement at 50,000 units per week x 12 weeks.  Plan - she is advised that she should take vit D [phone] international units daily after completion of replacement             for recurrent Vit D deficiency she is advised to seek an endocrine consult for potential underlying cause for deficiency  Problem # 4:  HYPOTHYROIDISM (ICD-244.9)  Labs Reviewed: TSH: 2.23 (08/14/2010)     Good control on present dose of replacement  Problem # 5:  MELANOMA (ICD-172.9) No recurrence. she has recently seen her dermatologist for a full body exam.  Problem # 6:  HYPERTENSION (ICD-401.9)  Her updated medication list for this problem includes:    Taztia Xt 360 Mg Xr24h-cap (Diltiazem hcl er beads) .[name]... 1 by mouth once daily    Enalapril Maleate 20 Mg Tabs (Enalapril maleate) .[name]... 1 by mouth once daily    Lasix 40 Mg Tabs (Furosemide) .[name]... Take 1/2 by mouth once daily  BP today: 110/62 Prior BP: 138/88 (01/08/2010)  Labs Reviewed: K+: 4.8 (08/14/2010) Creat: : 0.8 (08/14/2010)  Excellent control. Will continue present medications.  Problem # 7:  HYPERLIPIDEMIA (ICD-272.4)  Her updated medication list for this problem includes:    Simvastatin 40 Mg Tabs (Simvastatin) .[name]... 1 by mouth q pm  Labs Reviewed: SGOT: 21 (08/14/2010)   SGPT: 29 (08/14/2010)   HDL:78.90 (08/14/2010), 43.40 (07/31/2009)  LDL: 195 (08/14/10) 80 (07/31/2009), 100 (05/21/2009)  Chol:284 (08/14/2010), 140 (07/31/2009)  Trig:76.0 (08/14/2010), 84.0 (07/31/2009)  OUT OF CONTROL. Discussed the importance of risk reduction for coronary and vascular disease. She admits to not taking her medications, and when she stopped she did not see an improvement in her muscle pain.  Plan - resume medication on daily basis.           follow-up lab   Problem # 8:  MYALGIA (ICD-729.1) Patient with fibromyalgia that is well controlled by cymbalta and nighttime hydorcodone.  Plan - continue present medications.  Her updated medication list for this problem includes:    Excedrin Tension Headache 500-65 Mg Tabs (Acetaminophen-caffeine) .[name]... As needed    Hydrocodone-acetaminophen 5-325 Mg Tabs  (Hydrocodone-acetaminophen) .[name]... Take as needed.  Problem # 9:  Preventive Health Care (ICD-V70.0) Interval medical history is unremarkable. Limited exam is normal. She is current with her gynecologist. She is current with mammography. Last colonoscopy  Feb '06. Last mammogram Feb /12. Immunizations: Tetnus Feb '11; Pneumonia vaccine Feb '11;  shingles vaccine today. 12 Lead EKG without signs of ischemia or injury.  In summary - a very nice woman who is medically stable and doing fairly well. she does need to take her antilipemic medication. She is advised to increase her regular exercise. She will return for  follow-up lab. Otherwise, she will return as needed or 1 year.   Complete Medication List: 1)  Taztia Xt 360 Mg Xr24h-cap (Diltiazem hcl er beads) .[name].. 1 by mouth once daily 2)  Temazepam 30 Mg Caps (Temazepam) .... Take 1 tab by mouth at bedtime 3)  Enalapril Maleate 20 Mg Tabs (Enalapril maleate) .[name].. 1 by mouth once daily 4)  Lasix 40 Mg Tabs (Furosemide) .... Take 1/2 by mouth once daily 5)  Zantac 150 Mg Tabs (Ranitidine hcl) .... As needed 6)  Simvastatin 40 Mg Tabs (Simvastatin) .[name].. 1 by mouth q pm 7)  Excedrin Tension Headache 500-65 Mg Tabs (Acetaminophen-caffeine) .... As needed 8)  Centrum Silver Tabs (Multiple vitamins-minerals) .[name].. 1 daily 9)  Vitamin C 250 Mg Chew (Ascorbic acid) .[name].. 1 daily 10)  Vitamin E 100 Unit Caps (Vitamin e) .[name].. 1 cap daily 11)  Alendronate Sodium 70 Mg Tabs (Alendronate sodium) .[name].. 1 q week 12)  Mimvey 1-0.5 Mg Tabs (Estradiol-norethindrone acet) .[name].. 1 once daily 13)  Glucosamine-chondroitin 500-400 Mg Caps (Glucosamine-chondroitin) .... 2 by mouth once daily for joints 14)  Cymbalta 30 Mg Cpep (Duloxetine hcl) .[name].. 1 by mouth qd 15)  Hydrocodone-acetaminophen 5-325 Mg Tabs (Hydrocodone-acetaminophen) .... Take as needed.  Other Orders: Zoster (Shingles) Vaccine Live [phone]) Admin 1st Vaccine (98119) Medicare -1st Annual Wellness Visit  [phone])  Patient: Michelle [name] Note: All result statuses are Final unless otherwise noted.  Tests: (1) Lipid Panel (LIPID)   Cholesterol          [H]  284 mg/dL                   9-562     ATP III Classification            Desirable:  < 200 mg/dL                    Borderline High:  200 - 239 mg/dL               High:  > = 240 mg/dL   Triglycerides             76.0 mg/dL                  1.3-086.5     Normal:  <150 mg/dL     Borderline High:  784 - 199 mg/dL   HDL                       69.62 mg/dL                 >95.28   VLDL Cholesterol          15.2 mg/dL                  4.1-32.4  CHO/HDL Ratio:  CHD Risk                             4                    Men          Women  1/2 Average Risk     3.4          3.3     Average Risk          5.0          4.4     2X Average Risk          9.6          7.1     3X Average Risk          15.0          11.0                           Tests: (2) BMP (METABOL)   Sodium                    141 mEq/L                   135-145   Potassium                 4.8 mEq/L                   3.5-5.1   Chloride                  102 mEq/L                   96-112   Carbon Dioxide            32 mEq/L                    19-32   Glucose                   79 mg/dL                    16-10   BUN                       20 mg/dL                    9-60   Creatinine                0.8 mg/dL                   4.5-4.0   Calcium                   9.7 mg/dL                   9.8-11.9   GFR                       80.75 mL/min                >60.00  Tests: (3) CBC Platelet w/Diff (CBCD)   White Cell Count     [H]  15.7 K/uL                   4.5-10.5   Red Cell Count            4.99 Mil/uL                 3.87-5.11   Hemoglobin                14.2 g/dL  12.0-15.0   Hematocrit                42.2 %                      36.0-46.0   MCV                       84.7 fl                     78.0-100.0   MCHC                      33.7 g/dL                    65.7-84.6   RDW                       14.1 %                      11.5-14.6   Platelet Count            336.0 K/uL                  150.0-400.0   Neutrophil %         [H]  77.9 %                      43.0-77.0   Lymphocyte %              15.0 %                      12.0-46.0   Monocyte %                6.8 %                       3.0-12.0   Eosinophils%              0.1 %                       0.0-5.0   Basophils %               0.2 %                       0.0-3.0   Neutrophill Absolute [H]  12.2 K/uL                   1.4-7.7   Lymphocyte Absolute       2.4 K/uL                    0.7-4.0   Monocyte Absolute    [H]  1.1 K/uL                    0.1-1.0  Eosinophils, Absolute                             0.0 K/uL                    0.0-0.7   Basophils Absolute        0.0 K/uL  0.0-0.1  Tests: (4) Hepatic/Liver Function Panel (HEPATIC)   Total Bilirubin           0.6 mg/dL                   2.7-2.5   Direct Bilirubin          0.1 mg/dL                   3.6-6.4   Alkaline Phosphatase      87 U/L                      39-117   AST                       21 U/L                      0-37   ALT                       29 U/L                      0-35   Total Protein             7.4 g/dL                    4.0-3.4   Albumin                   4.1 g/dL                    7.4-2.5  Tests: (5) TSH (TSH)   FastTSH                   2.23 uIU/mL                 0.35-5.50  Tests: (6) UDip Only (UDIP)   Color                     LT. YELLOW       RANGE:  Yellow;Lt. Yellow   Clarity                   CLEAR                       Clear   Specific Gravity          1.010                       1.000 - 1.030   Urine Ph                  6.5                         5.0-8.0   Protein                   NEGATIVE                    Negative   Urine Glucose             NEGATIVE                    Negative   Ketones  NEGATIVE                    Negative   Urine Bilirubin            NEGATIVE                    Negative   Blood                     NEGATIVE                    Negative   Urobilinogen              0.2                         0.0 - 1.0   Leukocyte Esterace        NEGATIVE                    Negative   Nitrite                   NEGATIVE                    Negative  Tests: (7) Cholesterol LDL - Direct (DIRLDL)  Cholesterol LDL - Direct                             195.0 mg/dL     Optimal:  <161 mg/dL     Near or Above Optimal:  100-129 mg/dL     Borderline High:  096-045 mg/dL     High:  409-811 mg/dL     Very High:  >914 mg/dLPrescriptions: HYDROCODONE-ACETAMINOPHEN 5-325 MG TABS (HYDROCODONE-ACETAMINOPHEN) take as needed.  #60 x 1   Entered by:   Bill Salinas CMA   Authorized by:   Jacques Navy MD   Signed by:   Bill Salinas CMA on 08/20/2010   Method used:   Telephoned to ...       [facility] [address]* (retail)       [address]       [place], [place]  [address]       Ph: [phone]       Fax: [phone]   RxID:   [id number] TEMAZEPAM 30 MG CAPS (TEMAZEPAM) Take 1 tab by mouth at bedtime  #30 x 12   Entered by:   Ami Bullins CMA   Authorized by:   Jacques Navy MD   Signed by:   Bill Salinas CMA on 08/20/2010   Method used:   Telephoned to ...       [facility] [address] (retail)       [address]       [place], [place]  [address]       Ph: [phone]       Fax: [phone]   RxID:   [id number] SIMVASTATIN 40 MG  TABS (SIMVASTATIN) 1 by mouth q PM  #30 x 12   Entered and Authorized by:   Jacques Navy MD   Signed by:   Jacques Navy MD on 08/20/2010   Method used:   Electronically to        [facility] (retail)  [address]       [place], [place]  [address]       Ph: [phone]       Fax: [phone]   RxID:   [id number] ZANTAC 150 MG  TABS (RANITIDINE HCL) as needed  #60 x 12   Entered and  Authorized by:   Jacques Navy MD   Signed by:   Jacques Navy MD on 08/20/2010   Method used:   Electronically to        [facility] (retail)       [address]       [place], [place]  [address]       Ph: [phone]       Fax: [phone]   RxID:   [id number] LASIX 40 MG  TABS (FUROSEMIDE) take 1/2 by mouth once daily  #30 x 12   Entered and Authorized by:   Jacques Navy MD   Signed by:   Jacques Navy MD on 08/20/2010   Method used:   Electronically to        [facility] (retail)       [address]       [place], [place]  [address]       Ph: [phone]       Fax: [phone]   RxID:   [id number] ENALAPRIL MALEATE 20 MG  TABS (ENALAPRIL MALEATE) 1 by mouth once daily  #30 x 12   Entered and Authorized by:   Jacques Navy MD   Signed by:   Jacques Navy MD on 08/20/2010   Method used:   Electronically to        [facility] (retail)       [address]       [place], [place]  [address]       Ph: [phone]       Fax: [phone]   RxID:   [id number] TAZTIA XT 360 MG XR24H-CAP (DILTIAZEM HCL ER BEADS) 1 by mouth once daily  #30 x 12   Entered and Authorized by:   Jacques Navy MD   Signed by:   Jacques Navy MD on 08/20/2010   Method used:   Electronically to        [facility] (retail)       [address]       [place], [place]  [address]       Ph: [phone]       Fax: [phone]   RxID:   [id number]    Orders Added: 1)  Zoster (Shingles) Vaccine Live [90736] 2)  Admin 1st Vaccine [19509] 3)  Cardiology Referral [Cardiology] 4)  Medicare -1st Annual Wellness Visit [G0438] 5)  Est. Patient Level IV [[address]671]   Immunizations Administered:  Zostavax # 1:    Vaccine Type: Zostavax    Site: right arm    Mfr: Merck    Dose: 0.65    Route: [place]    Given by:  Ami Bullins CMA    Exp. Date: 02/15/2011    Lot #: [id number]    VIS given: 04/11/05 given August 20, 2010.   Immunizations Administered:  Zostavax #  1:    Vaccine Type: Zostavax    Site: right arm    Mfr: Merck    Dose: 0.65    Route: [place]    Given by: Ami Bullins CMA    Exp. Date: 02/15/2011    Lot #: [id number]    VIS given: 04/11/05 given August 20, 2010.   Preventive Care Screening  Mammogram:    Date:  08/14/2010    Results:  normal bilateral   Last Flu Shot:    Date:  04/10/2010    Results:  given

## 2010-08-29 ENCOUNTER — Telehealth: Payer: Self-pay | Admitting: Internal Medicine

## 2010-08-29 ENCOUNTER — Encounter: Payer: Self-pay | Admitting: Internal Medicine

## 2010-09-05 NOTE — Progress Notes (Signed)
  Phone Note Other Incoming   Caller: Elwyn Reach [phone] Summary of Call: Pt had a doppler July of last year. She is sch for a follow up this July. They can do a doppler but not sure if her insurance will pay for it. What do you advise. Berton Mount said her last doppler is in emr Initial call taken by: Ami Bullins CMA,  August 29, 2010 8:09 AM  Follow-up for Phone Call        OK then.Marland KitchenMarland KitchenMarland KitchenI could not find the Carotid Doppler from last july when I looked through EMR. It is fine to do routine follow-up in July '12. Call and let the patient know.  THANKS  Follow-up by: Jacques Navy MD,  August 29, 2010 11:31 AM  Additional Follow-up for Phone Call Additional follow up Details #1::        lmoam for pt to call back/Ami Bullins CMA  August 30, 2010 10:01 AM   Spoke with Berton Mount in the Vascular lab and she has informed pt. She also told the pt they would call her in july for her repeat. Additional Follow-up by: Ami Bullins CMA,  August 30, 2010 10:03 AM

## 2010-09-05 NOTE — Miscellaneous (Signed)
Summary: Orders Update  Clinical Lists Changes  Orders: Added new Test order of Carotid Duplex (Carotid Duplex) - Signed 

## 2010-09-24 ENCOUNTER — Telehealth: Payer: Self-pay | Admitting: *Deleted

## 2010-09-24 NOTE — Telephone Encounter (Signed)
Pt left vm - cymbalta req PA - please assist with this, THANKS

## 2010-10-01 NOTE — Telephone Encounter (Signed)
Called [facility] to receive Pt ID# and PA phone# Called [phone] [facility] - transferred to [phone] [employer] for PA PA Approval recvd Effective 10/01/10-09/30/13  Called pharmacy and [facility] w/Approval information & request to reprocess refill Pt informed.

## 2010-11-15 NOTE — Assessment & Plan Note (Signed)
[facility]                           PRIMARY CARE OFFICE NOTE   Michelle, [name]                        MRN:          [id number]  DATE:07/29/2006                            DOB:          Feb 28, 1946    Michelle [name] is a very pleasant 65 year old woman, who is followed for  hypertension and hyperlipidemia, who presents today for followup  evaluation and exam.  She was last seen in Michelle office February 23, 2006,  for pain in her left arm and shoulder area.  She has had some mild  decreased range of motion.  This has continued to be a problem for her  with discomfort in Michelle scapular area where she did have melanoma  excision.   PAST MEDICAL HISTORY:   SURGICAL:  1. Tubal ligation in Michelle past.  2. Excision of a melanoma near Michelle left scapula.  3. Excision of a small lesion in her right proximal upper extremity      that was benign.   MEDICAL ILLNESS:  1. Usual childhood disease.  2. Hypertension.  3. Hyperlipidemia.  4. Hypothyroid disease.  5. GERD.  6. History of melanoma.   GYN:  Michelle [name] is a gravida 5, para 4, with 1 SAD.   FAMILY HISTORY:  Positive for congestive heart failure and emphysema in  her father.  Mother with hypertension and degenerative joint disease  with a strong family history of hypertension.  Maternal grandmother did  have myocardial infarction.  No family history for breast cancer,  ovarian cancer, or uterine cancer.  Michelle [name] has a sister who had  fibromyalgia and chronic pain,  who passed away just several days ago of  unknown cause.   SOCIAL HISTORY:  Michelle [name] is married at 46 years.  She has 1 son, 3  daughters, and many grandchildren.   CURRENT MEDICATIONS:  1. Vitamin C daily.  2. Vitamin E daily.  3. Cozaar 100 mg daily.  4. Cardia XD 240 mg daily.  5. Lasix 40 mg daily.  6. Fish oil daily.  7. Ranitidine 150 mg q.p.m.  8. Temazepam 30 mg q.h.s.  9. Aciphex 20 mg daily.  10.Toprol XL 25 mg  daily.  11.Tessalon Perles on a p.r.n. basis.   HEALTH MAINTENANCE:  Michelle [name] does see her gynecologist with last  visit December 2007 with an abnormal Pap smear, and she is scheduled for  followup Pap smear.  This is Michelle second time this has occurred and  previously it was benign.  Last mammogram December 2007.  Last  colonoscopy was August 13, 2004.  Recall per Dr. Lina Sar.  Michelle  [name] had an EGD on August 13, 2004, with a biopsy.  It showed no  intestinal metaplasia, dysplasia, or malignancy.  She will have followup  per Dr. Juanda Chance.   REVIEW OF SYSTEMS:  Negative for any constitutional, cardiovascular,  respiratory, GI or GU problems.   EXAMINATION:  VITAL SIGNS:  Temperature was 98.2, blood pressure 152/90,  pulse 66, weight 160.  GENERAL APPEARANCE:  This is heavy-set Caucasian woman in no acute  distress.  HEENT:  Normocephalic, atraumatic.  ACs and TMs were unremarkable.  Oral  pharynx with native dentition.  No buccal or palate lesions were noted.  Posterior pharynx was clear.  Conjunctivae and sclerae were clear.  PERRLA, EOMI.  Funduscopic exam was unremarkable.  NECK:  Supple without thyromegaly.  No lymphadenopathy was noted in Michelle  cervical or supraclavicular regions.  CHEST:  CVA tenderness.  LUNGS:  Clear with no rales, wheezes, or rhonchi.  BREAST EXAM:  Deferred to gynecology.  CARDIOVASCULAR:  2+ radial pulses, no JVD, no carotid bruits.  She had a  quiet precordium with a regular rate and rhythm without murmurs, rubs,  or gallops.  ABDOMEN:  Soft, no guarding or rebound.  PELVIC AND RECTAL:  Deferred to gynecology.  EXTREMITIES:  Without clubbing, cyanosis, edema, or deformity.  NEUROLOGIC:  Grossly nonfocal.  DERM:  Michelle [name] has well-healed surgical scar at Michelle area of Michelle left  scapula approximately 5 cm in length, and she is tender in this area to  palpation.  No other skin lesions or abnormalities were noted.   DATA BASE:  Hemoglobin 14.2  g, white count was 7300 with a normal  differential.  Chemistries with normal electrolytes.  Serum glucose was  103.  Creatinine was 0.9.  Liver functions were normal.  Thyroid  function normal with a TSH of 4.08.  Cholesterol was 232, triglycerides  114.  HDL was 43.7.  LDL was 173.2.  Urinalysis with moderate leukocyte  esterase, 3-5 WBC per high-powered field, 1+ bacteria, but Michelle [name]  is asymptomatic.   ASSESSMENT/PLAN:  Hypertension.  Michelle [name]'s blood pressure continues  to be elevated.  She is on several drugs.  Michelle plan will be for Michelle  [name] to have her Cardia increased to 320 mg daily and will continue  otherwise on her present medications.  If Michelle [name] continues to have  poorly controlled blood pressure now on a 4 drug regimen, would consider  her a candidate for CT angiography to rule out renal artery stenosis.  1. Hyperlipidemia.  Michelle [name] does have significant elevated LDL      cholesterol in a setting of risk factors including hypertension,      being overweight, being postmenopausal.  Plan.  Michelle [name] is to      start Pravachol 40 mg daily which she did tolerate in Michelle past.  Of      note, she was intolerant of Vytorin because of myalgias.  We will      need to recheck her lipids in 4-6 weeks.  2. Dermatologic.  Michelle [name] with a history of melanoma, does see a      dermatologist on a regular basis for routine surveillance and is      doing well.  3. Health maintenance.  Michelle [name] is currently up to date with her      gynecologist for both gyn and breast health.  She is currently up      to date with gastroenterology in regard to colorectal cancer      screening.  Michelle [name] reports she did have a bone density study      in December 2006, which was unremarkable with no evidence of      osteoporosis.  Michelle [name] has had a sleep study in Michelle past with      no evidence of obstructive sleep apnea, although there is some     question of restless leg  syndrome.   In  summary, this is a very pleasant woman, who seems to be medically  stable.  She will start Pravachol as noted.  Repeat laboratory in 305  weeks.   I did offer my condolences on Michelle untimely and unfortunate death of her  sister.   Michelle [name] will be notified as to her lab results when available.     Rosalyn Gess Norins, MD  Electronically Signed    MEN/MedQ  DD: 08/01/2006  DT: 08/01/2006  Job #: [id number]   cc:   [place], [place] [address] [place], [address]

## 2010-12-27 ENCOUNTER — Other Ambulatory Visit: Payer: Self-pay | Admitting: Orthopaedic Surgery

## 2010-12-27 DIAGNOSIS — M25561 Pain in right knee: Secondary | ICD-10-CM

## 2011-01-07 ENCOUNTER — Ambulatory Visit
Admission: RE | Admit: 2011-01-07 | Discharge: 2011-01-07 | Disposition: A | Discharge status: Home / Self Care | Payer: PRIVATE HEALTH INSURANCE | Source: Ambulatory Visit | Attending: Orthopaedic Surgery | Admitting: Orthopaedic Surgery

## 2011-01-07 DIAGNOSIS — M25561 Pain in right knee: Secondary | ICD-10-CM

## 2011-01-17 ENCOUNTER — Other Ambulatory Visit: Payer: Self-pay | Admitting: Internal Medicine

## 2011-01-17 DIAGNOSIS — I6529 Occlusion and stenosis of unspecified carotid artery: Secondary | ICD-10-CM

## 2011-01-20 ENCOUNTER — Encounter (INDEPENDENT_AMBULATORY_CARE_PROVIDER_SITE_OTHER): Payer: PRIVATE HEALTH INSURANCE | Admitting: *Deleted

## 2011-01-20 DIAGNOSIS — I6529 Occlusion and stenosis of unspecified carotid artery: Secondary | ICD-10-CM

## 2011-01-22 ENCOUNTER — Encounter: Payer: Self-pay | Admitting: Internal Medicine

## 2011-01-26 ENCOUNTER — Encounter: Payer: Self-pay | Admitting: Internal Medicine

## 2011-03-04 ENCOUNTER — Other Ambulatory Visit: Payer: Self-pay | Admitting: Internal Medicine

## 2011-03-05 NOTE — Telephone Encounter (Signed)
Please Advise refills 

## 2011-03-05 NOTE — Telephone Encounter (Signed)
1. Ok for restoril refill x 5 2. Hydorcodone refill x 2 (3 total)

## 2011-03-07 ENCOUNTER — Telehealth: Payer: Self-pay | Admitting: *Deleted

## 2011-03-07 NOTE — Telephone Encounter (Signed)
See previous phone mess req Rfs. Pharm did not get Rfs. Called in and left mess for patient to check w/pharm.

## 2011-06-11 ENCOUNTER — Telehealth: Payer: Self-pay | Admitting: *Deleted

## 2011-06-11 NOTE — Telephone Encounter (Signed)
I don't recall doing a surgical clearance and I do not find a scanned document (not to say it isn't in the d--n system somewhere). Will be happy to complete clearance if another copy is sent to me and I can draft a letter.

## 2011-06-11 NOTE — Telephone Encounter (Signed)
Inquiring as to if you have completed Surgical Clearance Form for Knee replacement w/Dr Valentina Gu.

## 2011-06-12 NOTE — Telephone Encounter (Signed)
Patient informed/Surgical Clearance form received.

## 2011-06-18 ENCOUNTER — Telehealth: Payer: Self-pay | Admitting: *Deleted

## 2011-06-18 NOTE — Telephone Encounter (Signed)
I recall seeing it but do not recall if it was completed - either check to see if it was faxed and scanned to her record or get another clearance form for me. thanks

## 2011-06-18 NOTE — Telephone Encounter (Signed)
Did you ever received surgical clearance on Pt

## 2011-06-19 NOTE — Telephone Encounter (Signed)
Does not need a cardiologist to clear - will do it. Need another form

## 2011-06-19 NOTE — Telephone Encounter (Signed)
Pt called again regarding surgical clearance for knee replacement-pt was told by MD's office that she would need cardiac clearance as well-pt wants to know if she needs to see a cardiologist since she had a recent carotid study done earlier this year-please advise

## 2011-06-20 NOTE — Telephone Encounter (Signed)
Called pt to let her know, no one answered and she does not have answering machine, will try back

## 2011-06-25 NOTE — Telephone Encounter (Signed)
Called pt no answer, no answering machine will try back later

## 2011-06-26 ENCOUNTER — Encounter (HOSPITAL_COMMUNITY): Payer: Self-pay

## 2011-06-26 ENCOUNTER — Ambulatory Visit (HOSPITAL_COMMUNITY)
Admission: RE | Admit: 2011-06-26 | Discharge: 2011-06-26 | Disposition: A | Discharge status: Home / Self Care | Payer: PRIVATE HEALTH INSURANCE | Source: Ambulatory Visit | Attending: Orthopedic Surgery | Admitting: Orthopedic Surgery

## 2011-06-26 ENCOUNTER — Encounter (HOSPITAL_COMMUNITY)
Admission: RE | Admit: 2011-06-26 | Discharge: 2011-06-26 | Disposition: A | Discharge status: Home / Self Care | Payer: PRIVATE HEALTH INSURANCE | Source: Ambulatory Visit | Attending: Orthopedic Surgery | Admitting: Orthopedic Surgery

## 2011-06-26 ENCOUNTER — Encounter (HOSPITAL_COMMUNITY): Payer: Self-pay | Admitting: Pharmacy Technician

## 2011-06-26 ENCOUNTER — Other Ambulatory Visit: Payer: Self-pay | Admitting: Orthopedic Surgery

## 2011-06-26 ENCOUNTER — Other Ambulatory Visit: Payer: Self-pay

## 2011-06-26 DIAGNOSIS — Z01818 Encounter for other preprocedural examination: Secondary | ICD-10-CM | POA: Insufficient documentation

## 2011-06-26 DIAGNOSIS — Z01812 Encounter for preprocedural laboratory examination: Secondary | ICD-10-CM | POA: Insufficient documentation

## 2011-06-26 DIAGNOSIS — Z01810 Encounter for preprocedural cardiovascular examination: Secondary | ICD-10-CM | POA: Insufficient documentation

## 2011-06-26 HISTORY — DX: Unspecified osteoarthritis, unspecified site: M19.90

## 2011-06-26 HISTORY — DX: Gastro-esophageal reflux disease without esophagitis: K21.9

## 2011-06-26 HISTORY — DX: Essential (primary) hypertension: I10

## 2011-06-26 HISTORY — DX: Malignant (primary) neoplasm, unspecified: C80.1

## 2011-06-26 HISTORY — DX: Pneumonia, unspecified organism: J18.9

## 2011-06-26 LAB — PROTIME-INR
INR: 0.99 (ref 0.00–1.49)
Prothrombin Time: 13.3 seconds (ref 11.6–15.2)

## 2011-06-26 LAB — TYPE AND SCREEN
ABO/RH(D): A NEG
Antibody Screen: NEGATIVE

## 2011-06-26 LAB — COMPREHENSIVE METABOLIC PANEL
BUN: 14 mg/dL (ref 6–23)
CO2: 29 mEq/L (ref 19–32)
Calcium: 9.7 mg/dL (ref 8.4–10.5)
Creatinine, Ser: 0.63 mg/dL (ref 0.50–1.10)
GFR calc Af Amer: >90 mL/min (ref >90)
GFR calc non Af Amer: >90 mL/min (ref >90)
Glucose, Bld: 97 mg/dL (ref 70–99)
Total Protein: 7.5 g/dL (ref 6.0–8.3)

## 2011-06-26 LAB — URINALYSIS, ROUTINE W REFLEX MICROSCOPIC
Bilirubin Urine: NEGATIVE
Hgb urine dipstick: NEGATIVE
Ketones, ur: NEGATIVE mg/dL
Specific Gravity, Urine: 1.017 (ref 1.005–1.030)
Urobilinogen, UA: 1 mg/dL (ref 0.0–1.0)

## 2011-06-26 LAB — CBC
Hemoglobin: 14.2 g/dL (ref 12.0–15.0)
MCH: 27.4 pg (ref 26.0–34.0)
MCHC: 33.6 g/dL (ref 30.0–36.0)
MCV: 81.5 fL (ref 78.0–100.0)
RBC: 5.19 MIL/uL — ABNORMAL HIGH (ref 3.87–5.11)

## 2011-06-26 LAB — ABO/RH: ABO/RH(D): A NEG

## 2011-06-26 LAB — DIFFERENTIAL
Basophils Absolute: 0 10*3/uL (ref 0.0–0.1)
Eosinophils Relative: 1 % (ref 0–5)
Lymphocytes Relative: 17 % (ref 12–46)
Lymphs Abs: 1.7 10*3/uL (ref 0.7–4.0)
Neutro Abs: 7.8 10*3/uL — ABNORMAL HIGH (ref 1.7–7.7)
Neutrophils Relative %: 74 % (ref 43–77)

## 2011-06-26 LAB — URINE MICROSCOPIC-ADD ON

## 2011-06-26 LAB — SURGICAL PCR SCREEN: MRSA, PCR: NEGATIVE

## 2011-06-26 NOTE — Pre-Procedure Instructions (Signed)
20 Michelle [name]  06/26/2011   Your procedure is scheduled on: January 7th  Report to [facility] at 9:15 AM.  Call this number if you have problems the morning of surgery: [phone]   Remember:   Do not eat food:After Midnight.  May have clear liquids: up to 4 Hours before arrival: 9:15  Clear liquids include soda, tea, black coffee, apple or grape juice, broth.  Take these medicines the morning of surgery with A SIP OF WATER: Hydrocodone-Acetaminopen   Do not wear jewelry, make-up or nail polish.  Do not wear lotions, powders, or perfumes. You may wear deodorant.  Do not shave 48 hours prior to surgery.  Do not bring valuables to the hospital.  Contacts, dentures or bridgework may not be worn into surgery.  Leave suitcase in the car. After surgery it may be brought to your room.  For patients admitted to the hospital, checkout time is 11:00 AM the day of discharge.   Patients discharged the day of surgery will not be allowed to drive home.  Name and phone number of your driver: NA  Special Instructions: CHG Shower Use Special Wash: 1/2 bottle night before surgery and 1/2 bottle morning of surgery.   Please read over the following fact sheets that you were given: Pain Booklet, Coughing and Deep Breathing, Blood Transfusion Information, MRSA Information and Surgical Site Infection Prevention

## 2011-06-27 LAB — URINE CULTURE

## 2011-07-06 MED ORDER — CEFAZOLIN SODIUM-DEXTROSE 2-3 GM-% IV SOLR
2.0000 g | INTRAVENOUS | Status: AC
  Administered 2011-07-07: 2 g via INTRAVENOUS
  Filled 2011-07-06: qty 50

## 2011-07-07 ENCOUNTER — Ambulatory Visit (HOSPITAL_COMMUNITY): Payer: PRIVATE HEALTH INSURANCE | Admitting: Certified Registered"

## 2011-07-07 ENCOUNTER — Inpatient Hospital Stay (HOSPITAL_COMMUNITY)
Admission: RE | Admit: 2011-07-07 | Discharge: 2011-07-09 | DRG: 470 | Disposition: A | Discharge status: Home / Self Care | Payer: PRIVATE HEALTH INSURANCE | Source: Ambulatory Visit | Attending: Orthopedic Surgery | Admitting: Orthopedic Surgery

## 2011-07-07 ENCOUNTER — Encounter (HOSPITAL_COMMUNITY): Payer: Self-pay | Admitting: *Deleted

## 2011-07-07 ENCOUNTER — Encounter (HOSPITAL_COMMUNITY)
Admission: RE | Disposition: A | Discharge status: Home / Self Care | Payer: Self-pay | Source: Ambulatory Visit | Attending: Orthopedic Surgery

## 2011-07-07 ENCOUNTER — Encounter (HOSPITAL_COMMUNITY): Payer: Self-pay | Admitting: Certified Registered"

## 2011-07-07 DIAGNOSIS — K219 Gastro-esophageal reflux disease without esophagitis: Secondary | ICD-10-CM | POA: Diagnosis present

## 2011-07-07 DIAGNOSIS — M1710 Unilateral primary osteoarthritis, unspecified knee: Principal | ICD-10-CM | POA: Diagnosis present

## 2011-07-07 DIAGNOSIS — Z8701 Personal history of pneumonia (recurrent): Secondary | ICD-10-CM

## 2011-07-07 DIAGNOSIS — M1711 Unilateral primary osteoarthritis, right knee: Secondary | ICD-10-CM

## 2011-07-07 DIAGNOSIS — E039 Hypothyroidism, unspecified: Secondary | ICD-10-CM | POA: Diagnosis present

## 2011-07-07 DIAGNOSIS — D62 Acute posthemorrhagic anemia: Secondary | ICD-10-CM | POA: Diagnosis not present

## 2011-07-07 DIAGNOSIS — I10 Essential (primary) hypertension: Secondary | ICD-10-CM | POA: Diagnosis present

## 2011-07-07 HISTORY — PX: TOTAL KNEE ARTHROPLASTY: SHX125

## 2011-07-07 SURGERY — ARTHROPLASTY, KNEE, TOTAL
Anesthesia: General | Site: Knee | Laterality: Right | Wound class: Clean

## 2011-07-07 MED ORDER — METHOCARBAMOL 100 MG/ML IJ SOLN
500.0000 mg | INTRAVENOUS | Status: AC
  Administered 2011-07-07: 500 mg via INTRAVENOUS
  Filled 2011-07-07: qty 5

## 2011-07-07 MED ORDER — ACETAMINOPHEN 650 MG RE SUPP: 650.0000 mg | Freq: Four times a day (QID) | RECTAL | Status: DC | PRN

## 2011-07-07 MED ORDER — MIDAZOLAM HCL 2 MG/2ML IJ SOLN
1.0000 mg | INTRAMUSCULAR | Status: DC | PRN
  Administered 2011-07-07: 2 mg via INTRAVENOUS

## 2011-07-07 MED ORDER — DIPHENHYDRAMINE HCL 12.5 MG/5ML PO ELIX
12.5000 mg | ORAL_SOLUTION | ORAL | Status: DC | PRN
  Filled 2011-07-07: qty 10

## 2011-07-07 MED ORDER — METHOCARBAMOL 100 MG/ML IJ SOLN
500.0000 mg | Freq: Four times a day (QID) | INTRAVENOUS | Status: DC | PRN
  Filled 2011-07-07: qty 5

## 2011-07-07 MED ORDER — METOCLOPRAMIDE HCL 5 MG/ML IJ SOLN
5.0000 mg | Freq: Three times a day (TID) | INTRAMUSCULAR | Status: DC | PRN
  Filled 2011-07-07: qty 2

## 2011-07-07 MED ORDER — ONDANSETRON HCL 4 MG/2ML IJ SOLN
INTRAMUSCULAR | Status: DC | PRN
  Administered 2011-07-07: 4 mg via INTRAVENOUS

## 2011-07-07 MED ORDER — BUPIVACAINE 0.25 % ON-Q PUMP SINGLE CATH 300ML
INJECTION | Status: DC | PRN
  Administered 2011-07-07: 300 mL

## 2011-07-07 MED ORDER — ONDANSETRON HCL 4 MG PO TABS: 4.0000 mg | ORAL_TABLET | Freq: Four times a day (QID) | ORAL | Status: DC | PRN

## 2011-07-07 MED ORDER — MEPERIDINE HCL 25 MG/ML IJ SOLN: 6.2500 mg | INTRAMUSCULAR | Status: DC | PRN

## 2011-07-07 MED ORDER — OXYCODONE HCL 5 MG PO TABS
5.0000 mg | ORAL_TABLET | ORAL | Status: DC | PRN
  Administered 2011-07-07 – 2011-07-09 (×11): 10 mg via ORAL
  Filled 2011-07-07 (×12): qty 2

## 2011-07-07 MED ORDER — BUPIVACAINE 0.25 % ON-Q PUMP SINGLE CATH 300ML
300.0000 mL | INJECTION | Status: DC
  Filled 2011-07-07: qty 300

## 2011-07-07 MED ORDER — BUPIVACAINE ON-Q PAIN PUMP (FOR ORDER SET NO CHG)
INJECTION | Status: DC
  Filled 2011-07-07: qty 1

## 2011-07-07 MED ORDER — ONDANSETRON HCL 4 MG/2ML IJ SOLN
4.0000 mg | Freq: Four times a day (QID) | INTRAMUSCULAR | Status: DC | PRN
  Administered 2011-07-07 (×2): 4 mg via INTRAVENOUS
  Filled 2011-07-07 (×2): qty 2

## 2011-07-07 MED ORDER — ALUM & MAG HYDROXIDE-SIMETH 200-200-20 MG/5ML PO SUSP: 30.0000 mL | ORAL | Status: DC | PRN

## 2011-07-07 MED ORDER — PROPOFOL 10 MG/ML IV EMUL
INTRAVENOUS | Status: DC | PRN
  Administered 2011-07-07: 200 mg via INTRAVENOUS

## 2011-07-07 MED ORDER — BUPIVACAINE-EPINEPHRINE 0.25% -1:200000 IJ SOLN
INTRAMUSCULAR | Status: DC | PRN
  Administered 2011-07-07: 20 mL

## 2011-07-07 MED ORDER — ALENDRONATE SODIUM 70 MG PO TABS: 70.0000 mg | ORAL_TABLET | ORAL | Status: DC

## 2011-07-07 MED ORDER — ACETAMINOPHEN 10 MG/ML IV SOLN
INTRAVENOUS | Status: AC
  Filled 2011-07-07: qty 100

## 2011-07-07 MED ORDER — HYDRALAZINE HCL 20 MG/ML IJ SOLN
5.0000 mg | Freq: Once | INTRAMUSCULAR | Status: AC
  Administered 2011-07-07: 5 mg via INTRAVENOUS

## 2011-07-07 MED ORDER — OXYCODONE HCL 10 MG PO TB12
10.0000 mg | ORAL_TABLET | Freq: Two times a day (BID) | ORAL | Status: DC
  Administered 2011-07-07 – 2011-07-09 (×4): 10 mg via ORAL
  Filled 2011-07-07 (×4): qty 1

## 2011-07-07 MED ORDER — FENTANYL CITRATE 0.05 MG/ML IJ SOLN
INTRAMUSCULAR | Status: AC
  Filled 2011-07-07: qty 2

## 2011-07-07 MED ORDER — LACTATED RINGERS IV SOLN
INTRAVENOUS | Status: DC
  Administered 2011-07-07 (×2): via INTRAVENOUS

## 2011-07-07 MED ORDER — SIMVASTATIN 10 MG PO TABS: 10.0000 mg | ORAL_TABLET | Freq: Every day | ORAL | Status: DC

## 2011-07-07 MED ORDER — ENOXAPARIN SODIUM 40 MG/0.4ML ~~LOC~~ SOLN
40.0000 mg | SUBCUTANEOUS | Status: DC
  Administered 2011-07-08 – 2011-07-09 (×2): 40 mg via SUBCUTANEOUS
  Filled 2011-07-07 (×3): qty 0.4

## 2011-07-07 MED ORDER — PHENYLEPHRINE HCL 10 MG/ML IJ SOLN
INTRAMUSCULAR | Status: DC | PRN
  Administered 2011-07-07: 80 ug via INTRAVENOUS

## 2011-07-07 MED ORDER — ZOLPIDEM TARTRATE 5 MG PO TABS: 5.0000 mg | ORAL_TABLET | Freq: Every evening | ORAL | Status: DC | PRN

## 2011-07-07 MED ORDER — TEMAZEPAM 15 MG PO CAPS
30.0000 mg | ORAL_CAPSULE | Freq: Every day | ORAL | Status: DC
  Administered 2011-07-08 (×2): 30 mg via ORAL
  Filled 2011-07-07 (×2): qty 2

## 2011-07-07 MED ORDER — BUPIVACAINE-EPINEPHRINE PF 0.5-1:200000 % IJ SOLN
INTRAMUSCULAR | Status: DC | PRN
  Administered 2011-07-07: 30 mL

## 2011-07-07 MED ORDER — SENNOSIDES-DOCUSATE SODIUM 8.6-50 MG PO TABS: 1.0000 | ORAL_TABLET | Freq: Every evening | ORAL | Status: DC | PRN

## 2011-07-07 MED ORDER — BISACODYL 5 MG PO TBEC: 5.0000 mg | DELAYED_RELEASE_TABLET | Freq: Every day | ORAL | Status: DC | PRN

## 2011-07-07 MED ORDER — PHENOL 1.4 % MT LIQD
1.0000 | OROMUCOSAL | Status: DC | PRN
  Filled 2011-07-07: qty 177

## 2011-07-07 MED ORDER — METOCLOPRAMIDE HCL 10 MG PO TABS: 5.0000 mg | ORAL_TABLET | Freq: Three times a day (TID) | ORAL | Status: DC | PRN

## 2011-07-07 MED ORDER — CHLORHEXIDINE GLUCONATE 4 % EX LIQD: 60.0000 mL | Freq: Once | CUTANEOUS | Status: DC

## 2011-07-07 MED ORDER — DOCUSATE SODIUM 100 MG PO CAPS
100.0000 mg | ORAL_CAPSULE | Freq: Two times a day (BID) | ORAL | Status: DC
  Administered 2011-07-07 – 2011-07-09 (×4): 100 mg via ORAL
  Filled 2011-07-07 (×5): qty 1

## 2011-07-07 MED ORDER — ENALAPRIL MALEATE 20 MG PO TABS
20.0000 mg | ORAL_TABLET | Freq: Every day | ORAL | Status: DC
  Administered 2011-07-07: 20 mg via ORAL
  Filled 2011-07-07 (×3): qty 1

## 2011-07-07 MED ORDER — FLEET ENEMA 7-19 GM/118ML RE ENEM: 1.0000 | ENEMA | Freq: Once | RECTAL | Status: AC | PRN

## 2011-07-07 MED ORDER — HYDROMORPHONE HCL PF 1 MG/ML IJ SOLN
0.2500 mg | INTRAMUSCULAR | Status: DC | PRN
  Administered 2011-07-07 (×4): 0.5 mg via INTRAVENOUS

## 2011-07-07 MED ORDER — HYDROMORPHONE HCL PF 1 MG/ML IJ SOLN
INTRAMUSCULAR | Status: AC
  Filled 2011-07-07: qty 1

## 2011-07-07 MED ORDER — ACETAMINOPHEN 10 MG/ML IV SOLN
1000.0000 mg | Freq: Four times a day (QID) | INTRAVENOUS | Status: DC
  Administered 2011-07-07: 1000 mg via INTRAVENOUS

## 2011-07-07 MED ORDER — ACETAMINOPHEN 325 MG PO TABS
650.0000 mg | ORAL_TABLET | Freq: Four times a day (QID) | ORAL | Status: DC | PRN
  Administered 2011-07-07 – 2011-07-09 (×3): 650 mg via ORAL
  Filled 2011-07-07 (×3): qty 2

## 2011-07-07 MED ORDER — METHOCARBAMOL 500 MG PO TABS
500.0000 mg | ORAL_TABLET | Freq: Four times a day (QID) | ORAL | Status: DC | PRN
  Administered 2011-07-07 – 2011-07-08 (×3): 500 mg via ORAL
  Filled 2011-07-07 (×3): qty 1

## 2011-07-07 MED ORDER — MORPHINE SULFATE 2 MG/ML IJ SOLN: 0.0500 mg/kg | INTRAMUSCULAR | Status: DC | PRN

## 2011-07-07 MED ORDER — SIMVASTATIN 20 MG PO TABS
20.0000 mg | ORAL_TABLET | Freq: Every day | ORAL | Status: DC
  Administered 2011-07-07 – 2011-07-08 (×2): 20 mg via ORAL
  Filled 2011-07-07 (×3): qty 1

## 2011-07-07 MED ORDER — ONDANSETRON HCL 4 MG/2ML IJ SOLN: 4.0000 mg | Freq: Once | INTRAMUSCULAR | Status: DC | PRN

## 2011-07-07 MED ORDER — FUROSEMIDE 20 MG PO TABS
20.0000 mg | ORAL_TABLET | Freq: Every day | ORAL | Status: DC
Start: 2011-07-07 — End: 2011-07-09
  Administered 2011-07-07: 20 mg via ORAL
  Filled 2011-07-07 (×3): qty 1

## 2011-07-07 MED ORDER — CEFAZOLIN SODIUM 1-5 GM-% IV SOLN
1.0000 g | Freq: Four times a day (QID) | INTRAVENOUS | Status: AC
  Administered 2011-07-07 – 2011-07-08 (×3): 1 g via INTRAVENOUS
  Filled 2011-07-07 (×3): qty 50

## 2011-07-07 MED ORDER — FENTANYL CITRATE 0.05 MG/ML IJ SOLN
50.0000 ug | INTRAMUSCULAR | Status: DC | PRN
  Administered 2011-07-07: 100 ug via INTRAVENOUS

## 2011-07-07 MED ORDER — MIDAZOLAM HCL 2 MG/2ML IJ SOLN
INTRAMUSCULAR | Status: AC
  Filled 2011-07-07: qty 2

## 2011-07-07 MED ORDER — EPHEDRINE SULFATE 50 MG/ML IJ SOLN
INTRAMUSCULAR | Status: DC | PRN
  Administered 2011-07-07: 10 mg via INTRAVENOUS

## 2011-07-07 MED ORDER — MENTHOL 3 MG MT LOZG: 1.0000 | LOZENGE | OROMUCOSAL | Status: DC | PRN

## 2011-07-07 MED ORDER — HYDROMORPHONE HCL PF 1 MG/ML IJ SOLN
0.5000 mg | INTRAMUSCULAR | Status: DC | PRN
  Administered 2011-07-07 (×3): 1 mg via INTRAVENOUS
  Filled 2011-07-07 (×3): qty 1

## 2011-07-07 MED ORDER — FENTANYL CITRATE 0.05 MG/ML IJ SOLN
INTRAMUSCULAR | Status: DC | PRN
  Administered 2011-07-07: 100 ug via INTRAVENOUS
  Administered 2011-07-07: 50 ug via INTRAVENOUS
  Administered 2011-07-07: 100 ug via INTRAVENOUS

## 2011-07-07 SURGICAL SUPPLY — 57 items
BANDAGE ESMARK 6X9 LF (GAUZE/BANDAGES/DRESSINGS) ×1 IMPLANT
BLADE SAGITTAL 13X1.27X60 (BLADE) ×2 IMPLANT
BLADE SAW SGTL 83.5X18.5 (BLADE) ×2 IMPLANT
BNDG CMPR 9X6 STRL LF SNTH (GAUZE/BANDAGES/DRESSINGS) ×1
BNDG ESMARK 6X9 LF (GAUZE/BANDAGES/DRESSINGS) ×2
BOWL SMART MIX CTS (DISPOSABLE) ×2 IMPLANT
CATH KIT ON Q 10IN SLV (PAIN MANAGEMENT) ×2 IMPLANT
CEMENT BONE SIMPLEX SPEEDSET (Cement) ×4 IMPLANT
CLOTH BEACON ORANGE TIMEOUT ST (SAFETY) ×2 IMPLANT
COVER BACK TABLE 24X17X13 BIG (DRAPES) IMPLANT
COVER SURGICAL LIGHT HANDLE (MISCELLANEOUS) ×2 IMPLANT
CUFF TOURNIQUET SINGLE 34IN LL (TOURNIQUET CUFF) ×2 IMPLANT
DRAPE EXTREMITY T 121X128X90 (DRAPE) ×2 IMPLANT
DRAPE INCISE IOBAN 66X45 STRL (DRAPES) ×4 IMPLANT
DRAPE PROXIMA HALF (DRAPES) ×2 IMPLANT
DRAPE U-SHAPE 47X51 STRL (DRAPES) ×2 IMPLANT
DRSG ADAPTIC 3X8 NADH LF (GAUZE/BANDAGES/DRESSINGS) ×2 IMPLANT
DRSG PAD ABDOMINAL 8X10 ST (GAUZE/BANDAGES/DRESSINGS) ×2 IMPLANT
DURAPREP 26ML APPLICATOR (WOUND CARE) ×4 IMPLANT
ELECT REM PT RETURN 9FT ADLT (ELECTROSURGICAL) ×2
ELECTRODE REM PT RTRN 9FT ADLT (ELECTROSURGICAL) ×1 IMPLANT
EVACUATOR 1/8 PVC DRAIN (DRAIN) ×2 IMPLANT
GAUZE SPONGE 4X4 12PLY STRL LF (GAUZE/BANDAGES/DRESSINGS) ×2 IMPLANT
GLOVE BIOGEL M 7.0 STRL (GLOVE) IMPLANT
GLOVE BIOGEL PI IND STRL 7.5 (GLOVE) IMPLANT
GLOVE BIOGEL PI IND STRL 8.5 (GLOVE) ×2 IMPLANT
GLOVE BIOGEL PI INDICATOR 7.5 (GLOVE)
GLOVE BIOGEL PI INDICATOR 8.5 (GLOVE) ×2
GLOVE SURG ORTHO 8.0 STRL STRW (GLOVE) ×4 IMPLANT
GOWN PREVENTION PLUS XLARGE (GOWN DISPOSABLE) ×4 IMPLANT
GOWN STRL NON-REIN LRG LVL3 (GOWN DISPOSABLE) ×4 IMPLANT
HANDPIECE INTERPULSE COAX TIP (DISPOSABLE) ×2
HOOD PEEL AWAY FACE SHEILD DIS (HOOD) ×8 IMPLANT
KIT BASIN OR (CUSTOM PROCEDURE TRAY) ×2 IMPLANT
KIT ROOM TURNOVER OR (KITS) ×2 IMPLANT
MANIFOLD NEPTUNE II (INSTRUMENTS) ×2 IMPLANT
NEEDLE 22X1 1/2 (OR ONLY) (NEEDLE) IMPLANT
NS IRRIG 1000ML POUR BTL (IV SOLUTION) ×2 IMPLANT
PACK TOTAL JOINT (CUSTOM PROCEDURE TRAY) ×2 IMPLANT
PAD ARMBOARD 7.5X6 YLW CONV (MISCELLANEOUS) ×4 IMPLANT
PADDING CAST COTTON 6X4 STRL (CAST SUPPLIES) ×2 IMPLANT
POSITIONER HEAD PRONE TRACH (MISCELLANEOUS) ×2 IMPLANT
SET HNDPC FAN SPRY TIP SCT (DISPOSABLE) ×1 IMPLANT
SPONGE GAUZE 4X4 12PLY (GAUZE/BANDAGES/DRESSINGS) ×2 IMPLANT
STAPLER VISISTAT 35W (STAPLE) ×2 IMPLANT
SUCTION FRAZIER TIP 10 FR DISP (SUCTIONS) ×2 IMPLANT
SUT BONE WAX W31G (SUTURE) ×2 IMPLANT
SUT VIC AB 0 CTB1 27 (SUTURE) ×4 IMPLANT
SUT VIC AB 1 CT1 27 (SUTURE) ×8
SUT VIC AB 1 CT1 27XBRD ANBCTR (SUTURE) ×4 IMPLANT
SUT VIC AB 2-0 CT1 27 (SUTURE) ×4
SUT VIC AB 2-0 CT1 TAPERPNT 27 (SUTURE) ×2 IMPLANT
SYR CONTROL 10ML LL (SYRINGE) IMPLANT
TOWEL OR 17X24 6PK STRL BLUE (TOWEL DISPOSABLE) ×2 IMPLANT
TOWEL OR 17X26 10 PK STRL BLUE (TOWEL DISPOSABLE) ×2 IMPLANT
TRAY FOLEY CATH 14FR (SET/KITS/TRAYS/PACK) ×2 IMPLANT
WATER STERILE IRR 1000ML POUR (IV SOLUTION) ×6 IMPLANT

## 2011-07-07 NOTE — Preoperative (Signed)
Beta Blockers   Reason not to administer Beta Blockers:Not Applicable 

## 2011-07-07 NOTE — Anesthesia Preprocedure Evaluation (Addendum)
Anesthesia Evaluation  Patient identified by MRN, date of birth, ID band Patient awake    Reviewed: Allergy & Precautions, H&P , NPO status , Patient's Chart, lab work & pertinent test results  Airway Mallampati: II TM Distance: >3 FB Neck ROM: Full    Dental  (+) Teeth Intact and Dental Advisory Given   Pulmonary pneumonia ,  clear to auscultation        Cardiovascular hypertension, Pt. on medications Regular Normal    Neuro/Psych  Neuromuscular disease    GI/Hepatic GERD-  ,  Endo/Other  Hypothyroidism Morbid obesity  Renal/GU      Musculoskeletal   Abdominal   Peds  Hematology   Anesthesia Other Findings   Reproductive/Obstetrics                          Anesthesia Physical Anesthesia Plan  ASA: III  Anesthesia Plan: General   Post-op Pain Management:    Induction: Intravenous  Airway Management Planned: LMA  Additional Equipment:   Intra-op Plan:   Post-operative Plan: Extubation in OR  Informed Consent:   Dental advisory given  Plan Discussed with: CRNA, Anesthesiologist and Surgeon  Anesthesia Plan Comments:         Anesthesia Quick Evaluation

## 2011-07-07 NOTE — Anesthesia Postprocedure Evaluation (Signed)
  Anesthesia Post-op Note  Patient: Michelle [name]  Procedure(s) Performed:  TOTAL KNEE ARTHROPLASTY  Patient Location: PACU  Anesthesia Type: GA combined with regional for post-op pain  Level of Consciousness: awake alert  Airway and Oxygen Therapy: Patient Spontanous Breathing and Patient connected to nasal cannula oxygen  Post-op Pain: moderate  Post-op Assessment: Post-op Vital signs reviewed, Patient's Cardiovascular Status Stable, Respiratory Function Stable, Patent Airway, No signs of Nausea or vomiting and Pain level controlled  Post-op Vital Signs: Reviewed and stable  Complications: No apparent anesthesia complications

## 2011-07-07 NOTE — Transfer of Care (Signed)
Immediate Anesthesia Transfer of Care Note  Patient: Michelle [name]  Procedure(s) Performed:  TOTAL KNEE ARTHROPLASTY  Patient Location: PACU  Anesthesia Type: General  Level of Consciousness: awake, alert  and oriented  Airway & Oxygen Therapy: Patient Spontanous Breathing and Patient connected to nasal cannula oxygen  Post-op Assessment: Report given to PACU RN  Post vital signs: Reviewed and stable  Complications: No apparent anesthesia complications

## 2011-07-07 NOTE — H&P (Signed)
  Michelle [name] MRN:  [id number] DOB/SEX:  13-Aug-1945/female  CHIEF COMPLAINT:  Painful right Knee  HISTORY: Patient is a 66 y.o. female presented with a history of pain in the right knee. Onset of symptoms was gradual starting several years ago with gradually worsening course since that time. The patient noted no past surgery on the right knee. Prior procedures on the knee include arthroscopy. Patient has been treated conservatively with over-the-counter NSAIDs and activity modification. Patient currently rates pain in the knee at 10 out of 10 with activity. There is pain at night.  PAST MEDICAL HISTORY: Patient Active Problem List  Diagnoses Date Noted  . CAROTID ARTERY DISEASE 01/08/2010  . MYALGIA 08/07/2009  . MELANOMA 05/21/2009  . VITAMIN D DEFICIENCY 05/21/2009  . ENDOMETRIAL HYPERPLASIA UNSPECIFIED 05/21/2009  . ROTATOR CUFF INJURY, LEFT SHOULDER 05/21/2009  . HYPOTHYROIDISM 07/27/2007  . HYPERLIPIDEMIA 07/27/2007  . HYPERTENSION 07/27/2007  . GERD 07/27/2007   Past Medical History  Diagnosis Date  . Hypertension     takes Enalapril  . Cancer     Melanoma on back- Stage 1  . Pneumonia   . GERD (gastroesophageal reflux disease)     indegestion  . Arthritis     R knee and Left  foot   Past Surgical History  Procedure Date  . Knee arthroscopy 2012  . Tumor removal 2009    From uterus  . Rotator cuff repair 2010    Left     MEDICATIONS:   No prescriptions prior to admission    ALLERGIES:  No Known Allergies  REVIEW OF SYSTEMS:  Pertinent items are noted in HPI.   FAMILY HISTORY:   Family History  Problem Relation Age of Onset  . Anesthesia problems Neg Hx     SOCIAL HISTORY:   History  Substance Use Topics  . Smoking status: Never Smoker   . Smokeless tobacco: Not on file  . Alcohol Use: No     EXAMINATION:  Vital signs in last 24 hours:    General appearance: alert, cooperative and no distress Head: Normocephalic, without obvious  abnormality, atraumatic Lungs: clear to auscultation bilaterally Heart: regular rate and rhythm, S1, S2 normal, no murmur, click, rub or gallop Abdomen: soft, non-tender; bowel sounds normal; no masses,  no organomegaly Extremities: extremities normal, atraumatic, no cyanosis or edema and Homans sign is negative, no sign of DVT Pulses: 2+ and symmetric Skin: Skin color, texture, turgor normal. No rashes or lesions Neurologic: Alert and oriented X 3, normal strength and tone. Normal symmetric reflexes. Normal coordination and gait  Musculoskeletal:  ROM 0-115, Ligaments intact,  Imaging Review Plain radiographs demonstrate severe degenerative joint disease of the right knee. The overall alignment is mild valgus. The bone quality appears to be good for age and reported activity level.  Assessment/Plan: End stage arthritis, right knee   The patient history, physical examination and imaging studies are consistent with advanced degenerative joint disease of the right knee. The patient has failed conservative treatment.  The clearance notes were reviewed.  After discussion with the patient it was felt that Total Knee Replacement was indicated. The procedure,  risks, and benefits of total knee arthroplasty were presented and reviewed. The risks including but not limited to aseptic loosening, infection, blood clots, vascular injury, stiffness, patella tracking problems complications among others were discussed. The patient acknowledged the explanation, agreed to proceed with the plan.  Jady Braggs 07/07/2011, 7:25 AM

## 2011-07-07 NOTE — Anesthesia Procedure Notes (Signed)
Anesthesia Regional Block:  Femoral nerve block  Pre-Anesthetic Checklist: ,, timeout performed, Correct Patient, Correct Site, Correct Laterality, Correct Procedure, Correct Position, site marked, Risks and benefits discussed,  Surgical consent,  Pre-op evaluation,  At surgeon's request and post-op pain management  Laterality: Right and Lower  Prep: chloraprep       Needles:  Injection technique: Single-shot  Needle Type: Echogenic Needle     Needle Length: 9cm  Needle Gauge: 22 and 22 G    Additional Needles:  Procedures: ultrasound guided Femoral nerve block Narrative:  Start time: 07/07/2011 9:55 AM End time: 07/07/2011 10:10 AM Injection made incrementally with aspirations every 5 mL.  Performed by: Personally  Anesthesiologist: Sheldon Silvan, MD  Additional Notes: Marcaine 0.5% with EPI 1:200000  Femoral nerve block

## 2011-07-07 NOTE — Op Note (Signed)
TOTAL KNEE REPLACEMENT OPERATIVE NOTE:  07/07/2011  1:45 PM  PATIENT:  Michelle [name]  66 y.o. female  PRE-OPERATIVE DIAGNOSIS:  OA RIGHT KNEE  POST-OPERATIVE DIAGNOSIS:  OA RIGHT KNEE  PROCEDURE:  Procedure(s): TOTAL KNEE ARTHROPLASTY  SURGEON:  Surgeon(s): Raymon Mutton, MD  PHYSICIAN ASSISTANT: Altamese Cabal, [facility]  ANESTHESIA:   general  DRAINS: Hemovac and On-Q Marcaine Pain Pump  SPECIMEN: None  COUNTS:  Correct  TOURNIQUET:   Total Tourniquet Time Documented: Thigh (Right) - 57 minutes  DICTATION:  Indication for procedure:    The patient is a 66 y.o. female who has failed conservative treatment for OA RIGHT KNEE.  Informed consent was obtained prior to anesthesia. The risks versus benefits of the operation were explain and in a way the patient can, and did, understand.   Description of procedure:     The patient was taken to the operating room and placed under anesthesia.  The patient was positioned in the usual fashion taking care that all body parts were adequately padded and/or protected.  I foley catheter was placed.  A tourniquet was applied and the leg prepped and draped in the usual sterile fashion.  The extremity was exsanguinated with the esmarch and tourniquet inflated to 350 mmHg.  Pre-operative range of motion was normal.  The knee was in 5 degree of mild varus.  A midline incision approximately 6-7 inches long was made with a #10 blade.  A new blade was used to make a parapatellar arthrotomy going 2-3 cm into the quadriceps tendon, over the patella, and alongside the medial aspect of the patellar tendon.  A synovectomy was then performed with the #10 blade and forceps. I then elevated the deep MCL off the medial tibial metaphysis subperiosteally around to the semimembranosus attachment.    I everted the patella and used calipers to measure patellar thickness, which was ###.  I used the reamer to ream down to appropriate thickness to recreated the native  thickness.  I then removed excess bone with the rongeur and sagittal saw.  I used the ## mm template and drilled the three lug holes.  I then put the trial in place and measured the thickness with the calipers to ensure recreation of the native thickness.  The trial was then removed and the patella subluxed and the knee brought into flexion.  A homan retractor was place to retract and protect the patella and lateral structures.  A Z-retractor was place medially to protect the medial structures.  The extra-medullary alignment system was used to make cut the tibial articular surface perpendicular to the anamotic axis of the tibia and in 3 degrees of posterior slope.  The cut surface and alignment jig was removed.  I then used the intramedullary alignment guide to make a 3 valgus cut on the distal femur.  I then marked out the epicondylar axis on the distal femur.  The posterior condylar axis measured 6 degrees.  I then used the anterior referencing sizer and measured the femur to be a size C.  The 4-In-1 cutting block was screwed into place in external rotation matching the posterior condylar angle, making our cuts perpendicular to the epicondylar axis.  Anterior, posterior and chamfer cuts were made with the sagittal saw.  The cutting block and cut pieces were removed.  A lamina spreader was placed in 90 degrees of flexion.  The ACL, PCL, menisci, and posterior condylar osteophytes were removed.  A 10 mm spacer blocked was found to  offer good flexion and extension gap balance after minimal in degree releasing.   The scoop retractor was then placed and the femoral finishing block was pinned in place.  The small sagittal saw was used as well as the lug drill to finish the femur.  The block and cut surfaces were removed and the medullary canal hole filled with autograft bone from the cut pieces.  The tibia was delivered forward in deep flexion and external rotation.  A size 2 tray was selected and pinned into  place centered on the medial 1/3 of the tibial tubercle.  The reamer and keel was used to prepare the tibia through the tray.    I then trialed with the size C femur, size 2 tibia, a 10 mm insert and the 32 patella.  I had excellent flexion/extension gap balance, excellent patella tracking.  Flexion was full and beyond 120 degrees; extension was zero.  These components were chosen and the staff opened them to me on the back table while the knee was lavaged copiously and the cement mixed.  I cemented in the components and removed all excess cement.  The polyethylene tibial component was snapped into place and the knee placed in extension while cement was hardening.  The capsule was infilltrated with 20cc of .25% Marcaine with epinephrine.  A hemovac was place in the joint exiting superolaterally.  A pain pump was place superomedially superficial to the arthrotomy.  Once the cement was hard, the tourniquet was let down.  Hemostasis was obtained.  The arthrotomy was closed with figure-8 #1 vicryl sutures.  The deep soft tissues were closed with #0 vicryls and the subcuticular layer closed with a running #2-0 vicryl.  The skin was reapproximated and closed with skin staples.  The wound was dressed with xeroform, 4 x4's, 2 ABD sponges, a single layer of webril and a TED stocking.   The patient was then awakened, extubated, and taken to the recovery room in stable condition.  BLOOD LOSS:  300cc DRAINS: 1 hemovac, 1 pain catheter COMPLICATIONS:  None.  PLAN OF CARE: Admit to inpatient   PATIENT DISPOSITION:  PACU - hemodynamically stable.   Delay start of Pharmacological VTE agent (>24hrs) due to surgical blood loss or risk of bleeding:  not applicable

## 2011-07-08 ENCOUNTER — Encounter (HOSPITAL_COMMUNITY): Payer: Self-pay | Admitting: Orthopedic Surgery

## 2011-07-08 LAB — CBC
HCT: 33.7 % — ABNORMAL LOW (ref 36.0–46.0)
Hemoglobin: 10.7 g/dL — ABNORMAL LOW (ref 12.0–15.0)
MCHC: 31.8 g/dL (ref 30.0–36.0)

## 2011-07-08 LAB — BASIC METABOLIC PANEL
BUN: 12 mg/dL (ref 6–23)
GFR calc non Af Amer: 87 mL/min — ABNORMAL LOW (ref >90)
Glucose, Bld: 120 mg/dL — ABNORMAL HIGH (ref 70–99)
Potassium: 4 mEq/L (ref 3.5–5.1)

## 2011-07-08 NOTE — Progress Notes (Signed)
Physical Therapy Evaluation Patient Details Name: EMMAJANE ALTAMURA MRN: [id number] DOB: Aug 18, 1945 Today's Date: 07/08/2011  Problem List:  Patient Active Problem List  Diagnoses  . MELANOMA  . HYPOTHYROIDISM  . VITAMIN D DEFICIENCY  . HYPERLIPIDEMIA  . HYPERTENSION  . CAROTID ARTERY DISEASE  . GERD  . ENDOMETRIAL HYPERPLASIA UNSPECIFIED  . ROTATOR CUFF INJURY, LEFT SHOULDER  . MYALGIA    Past Medical History:  Past Medical History  Diagnosis Date  . Hypertension     takes Enalapril  . Cancer     Melanoma on back- Stage 1  . Pneumonia   . GERD (gastroesophageal reflux disease)     indegestion  . Arthritis     R knee and Left  foot   Past Surgical History:  Past Surgical History  Procedure Date  . Knee arthroscopy 2012  . Tumor removal 2009    From uterus  . Rotator cuff repair 2010    Left    PT Assessment/Plan/Recommendation PT Assessment Clinical Impression Statement: 66 yo female s/p RTKA presents with decr mobility/pain; will benefit from PT to maximize independence and safety with mobiity/amb/steps/therex to enable safe dc home  PT Recommendation/Assessment: Patient will need skilled PT in the acute care venue PT Problem List: Decreased strength;Decreased range of motion;Decreased activity tolerance;Decreased balance;Decreased mobility;Decreased knowledge of use of DME;Pain PT Therapy Diagnosis : Difficulty walking;Abnormality of gait;Acute pain PT Plan PT Frequency: 7X/week PT Treatment/Interventions: DME instruction;Gait training;Stair training;Functional mobility training;Therapeutic activities;Therapeutic exercise;Balance training;Patient/family education PT Recommendation Recommendations for Other Services: OT consult Follow Up Recommendations: Home health PT;Supervision/Assistance - 24 hour Equipment Recommended: Rolling walker with 5" wheels;3 in 1 bedside comode (short) PT Goals  Acute Rehab PT Goals PT Goal Formulation: With patient Time For Goal  Achievement: 7 days Pt will go Supine/Side to Sit: with modified independence PT Goal: Supine/Side to Sit - Progress: Other (comment) Pt will go Sit to Supine/Side: with modified independence PT Goal: Sit to Supine/Side - Progress: Other (comment) Pt will go Sit to Stand: with supervision PT Goal: Sit to Stand - Progress: Other (comment) Pt will go Stand to Sit: with supervision PT Goal: Stand to Sit - Progress: Other (comment) Pt will Ambulate: >150 feet;with supervision;with rolling walker (without Right knee buckle) PT Goal: Ambulate - Progress: Other (comment) Pt will Go Up / Down Stairs: 1-2 stairs;with min assist;with least restrictive assistive device PT Goal: Up/Down Stairs - Progress: Other (comment) Pt will Perform Home Exercise Program: Independently PT Goal: Perform Home Exercise Program - Progress: Other (comment)  PT Evaluation Precautions/Restrictions  Precautions Precautions: Fall Restrictions Weight Bearing Restrictions: Yes RLE Weight Bearing: Weight bearing as tolerated Prior Functioning  Home Living Lives With: Spouse;Family Receives Help From: Family Type of Home: House Home Layout: Two level;Able to live on main level with bedroom/bathroom Home Access: Stairs to enter Entrance Stairs-Rails: None Entrance Stairs-Number of Steps: 2 Bathroom Shower/Tub: Naval architect Equipment: Bedside commode/3-in-1;Walker - rolling Prior Function Level of Independence: Independent with basic ADLs;Independent with homemaking with ambulation Able to Take Stairs?: Yes Driving: Yes Vocation: Part time employment Cognition Cognition Arousal/Alertness: Awake/alert Overall Cognitive Status: Appears within functional limits for tasks assessed Orientation Level: Oriented X4 Sensation/Coordination Sensation Light Touch: Appears Intact Coordination Gross Motor Movements are Fluid and Coordinated: Yes Fine Motor Movements are Fluid and Coordinated: Not  tested Extremity Assessment RUE Assessment RUE Assessment: Within Functional Limits LUE Assessment LUE Assessment: Within Functional Limits RLE Assessment RLE Assessment: Exceptions to [facility] RLE Strength RLE Overall Strength Comments:  decr AROM and strength, limited by pain postop; positive quad activation, though weak LLE Assessment LLE Assessment: Within Functional Limits Mobility (including Balance) Bed Mobility Bed Mobility: Yes Supine to Sit: 3: Mod assist Supine to Sit Details (indicate cue type and reason): cues for technique; physical assist for Right LE Transfers Transfers: Yes Sit to Stand: 4: Min assist;From bed;With upper extremity assist Sit to Stand Details (indicate cue type and reason): cues for safe technique and hand placement Stand to Sit: 3: Mod assist;With upper extremity assist;With armrests;To chair/3-in-1 Stand to Sit Details: cues for optimal positioning for comfort, hand placement, and to control descent Ambulation/Gait Ambulation/Gait: Yes Ambulation/Gait Assistance: 3: Mod assist Ambulation/Gait Assistance Details (indicate cue type and reason): cues for sequence, and verbal and tactile cues to activate Right quad in stance for better safety and stability; required knee block 2/2 frequent knee buckle Ambulation Distance (Feet): 18 Feet Assistive device: Rolling walker Gait Pattern: Step-to pattern;Decreased stance time - right    Exercise  Total Joint Exercises Quad Sets: AROM;Right;15 reps;Supine Heel Slides: AAROM;Right;5 reps Straight Leg Raises: AAROM;Right;5 reps End of Session PT - End of Session Equipment Utilized During Treatment: Gait belt Activity Tolerance: Patient limited by pain Patient left: in chair;with call bell in reach Nurse Communication: Mobility status for transfers;Mobility status for ambulation General Behavior During Session: [facility] for tasks performed Cognition: [facility] for tasks performed  Van Clines [facility] [place], [place] [phone]  07/08/2011, 11:18 AM

## 2011-07-08 NOTE — Progress Notes (Signed)
Occupational Therapy Evaluation Patient Details Name: Michelle [name] MRN: [id number] DOB: 08/16/1945 Today's Date: 07/08/2011  Problem List:  Patient Active Problem List  Diagnoses  . MELANOMA  . HYPOTHYROIDISM  . VITAMIN D DEFICIENCY  . HYPERLIPIDEMIA  . HYPERTENSION  . CAROTID ARTERY DISEASE  . GERD  . ENDOMETRIAL HYPERPLASIA UNSPECIFIED  . ROTATOR CUFF INJURY, LEFT SHOULDER  . MYALGIA    Past Medical History:  Past Medical History  Diagnosis Date  . Hypertension     takes Enalapril  . Cancer     Melanoma on back- Stage 1  . Pneumonia   . GERD (gastroesophageal reflux disease)     indegestion  . Arthritis     R knee and Left  foot   Past Surgical History:  Past Surgical History  Procedure Date  . Knee arthroscopy 2012  . Tumor removal 2009    From uterus  . Rotator cuff repair 2010    Left    OT Assessment/Plan/Recommendation OT Assessment - Pt s/p R TKA. Decreased indep wit ADL. Completetd all education in this session. OT Recommendation/Assessment: Patient does not need any further OT services OT Recommendation Follow Up Recommendations: No OT follow up Equipment Recommended: Rolling walker with 5" wheels;3 in 1 bedside comode (short) OT Goals Acute Rehab OT Goals OT Goal Formulation:  (eval only) Time For Goal Achievement:  (eval only)  OT Evaluation Precautions/Restrictions  Precautions Precautions: Fall Restrictions Weight Bearing Restrictions: Yes RLE Weight Bearing: Weight bearing as tolerated Prior Functioning Home Living Lives With: Spouse;Family Receives Help From: Family Type of Home: House Home Layout: Two level;Able to live on main level with bedroom/bathroom Home Access: Stairs to enter Entrance Stairs-Rails: None Entrance Stairs-Number of Steps: 2 Bathroom Shower/Tub: Naval architect Equipment: Bedside commode/3-in-1;Walker - rolling Prior Function Level of Independence: Independent with basic ADLs;Independent with  homemaking with ambulation Able to Take Stairs?: Yes Driving: Yes Vocation: Part time employment ADL ADL Eating/Feeding: Independent Where Assessed - Eating/Feeding: Chair Grooming: Teeth care;Wash/dry face;Wash/dry hands;Performed;Supervision/safety Where Assessed - Grooming: Standing at sink Upper Body Bathing: Chest;Right arm;Left arm;Abdomen;Performed;Independent Where Assessed - Upper Body Bathing: Standing at sink Lower Body Bathing: Minimal assistance Where Assessed - Lower Body Bathing: Standing at sink Upper Body Dressing: Simulated;Independent Lower Body Dressing: Minimal assistance;Performed Where Assessed - Lower Body Dressing: Sit to stand from chair Toilet Transfer: Supervision/safety Toilet Transfer Method: Proofreader: Bedside commode Toileting - Clothing Manipulation: Independent;Performed Where Assessed - Glass blower/designer Manipulation: Standing Toileting - Hygiene: Independent Where Assessed - Toileting Hygiene: Standing Tub/Shower Transfer: Not assessed (Discussed and simulated walk in shower transfer) Tub/Shower Transfer Method: Science writer: Walk in shower Equipment Used: Rolling walker;Sock aid;Reacher;Long-handled sponge;Long-handled shoe horn Ambulation Related to ADLs: Supervision Vision/Perception  Vision - History Baseline Vision: No visual deficits Perception Perception: Within Functional Limits Praxis Praxis: Intact Cognition Cognition Orientation Level: Oriented X4 Sensation/Coordination Sensation Light Touch: Appears Intact Stereognosis: Appears Intact Hot/Cold: Appears Intact Proprioception: Appears Intact Coordination Gross Motor Movements are Fluid and Coordinated: Yes Fine Motor Movements are Fluid and Coordinated: Not tested Extremity Assessment RUE Assessment RUE Assessment: Within Functional Limits LUE Assessment LUE Assessment: Within Functional Limits Mobility  Bed  Mobility Bed Mobility: No Supine to Sit: Not tested (comment) (pt up in chair with PT. Discussed ways to increase indep wit) Supine to Sit Details (indicate cue type and reason): cues for technique; physical assist for Right LE Transfers Transfers: Yes Sit to Stand: With upper extremity assist;5: Supervision;From chair/3-in-1;With armrests Sit  to Stand Details (indicate cue type and reason): cues for safe technique and hand placement Stand to Sit: With upper extremity assist;With armrests;To chair/3-in-1;5: Supervision Stand to Sit Details: cues for optimal positioning for comfort, hand placement, and to control descent Skilled Intervention: Educated pt on use of AE to increase indep with ADL. Pt return demonstrated. Educated pt on availability of equip. With AE, pt at MoD I level. Pt will have assistance at home after D/C to help as needed with ADL. No further IT needs.   End of Session General Behavior During Session: [facility] for tasks performed Cognition: [facility] for tasks performed   [facility] 07/08/2011, 12:36 PM  [facility], OTR/L  [phone] 07/08/2011

## 2011-07-08 NOTE — Progress Notes (Signed)
Physical Therapy Treatment Patient Details Name: Michelle [name] MRN: [id number] DOB: 1945/09/23 Today's Date: 07/08/2011  PT Assessment/Plan  PT - Assessment/Plan Comments on Treatment Session: Improvement from morning session.  PT Plan: Discharge plan remains appropriate PT Goals  Acute Rehab PT Goals PT Goal: Supine/Side to Sit - Progress: Progressing toward goal PT Goal: Sit to Supine/Side - Progress: Progressing toward goal PT Goal: Sit to Stand - Progress: Progressing toward goal PT Goal: Stand to Sit - Progress: Progressing toward goal PT Goal: Ambulate - Progress: Progressing toward goal PT Goal: Up/Down Stairs - Progress: Not met PT Goal: Perform Home Exercise Program - Progress: Progressing toward goal  PT Treatment Precautions/Restrictions  Precautions Precautions: Fall Restrictions Weight Bearing Restrictions: Yes RLE Weight Bearing: Weight bearing as tolerated Mobility (including Balance) Bed Mobility Supine to Sit: 4: Min assist Supine to Sit Details (indicate cue type and reason): cueing for technique; pt using rails; HOB elevated approx 30 degrees Transfers Sit to Stand: 4: Min assist;With upper extremity assist;From bed Sit to Stand Details (indicate cue type and reason): cueing for safe hand placement and min facilitation fro follow through Stand to Sit: 4: Min assist;To bed;With upper extremity assist Stand to Sit Details: minA to slow descent to bed and for best positioning prior to sit->supine Ambulation/Gait Ambulation/Gait Assistance: 4: Min assist Ambulation/Gait Assistance Details (indicate cue type and reason): amb approx 40 ft with RW; step to technique, no buckling of right knee this afternoon; pt does have tendency to take larger step on right causing her to flex at her trunk; with one verbal cue she stopped doing this; v/cs and visual cues for safe technique with RW Ambulation Distance (Feet): 40 Feet Assistive device: Rolling walker  Posture/Postural  Control Posture/Postural Control: No significant limitations Balance Balance Assessed: No Exercise  Total Joint Exercises Ankle Circles/Pumps: AROM;5 reps;Right Quad Sets: AROM;Right;5 reps Heel Slides: AROM;Right;5 reps Straight Leg Raises: AAROM;Right;5 reps End of Session PT - End of Session Equipment Utilized During Treatment: Gait belt Activity Tolerance: Patient tolerated treatment well Patient left: in bed;with call bell in reach (CPM too big, ortho tech called to adjust it) Nurse Communication: Mobility status for transfers;Mobility status for ambulation General Behavior During Session: [facility] for tasks performed Cognition: [facility] for tasks performed  [facility] HELEN 07/08/2011, 4:31 PM

## 2011-07-08 NOTE — Progress Notes (Signed)
PATIENT ID:      Michelle [name]  MRN:     [id number] DOB/AGE:    1946/02/23 / 66 y.o.    PROGRESS NOTE Subjective:  negative for Chest Pain  negative for Shortness of Breath  negative for Nausea/Vomiting   negative for Calf Pain  negative for Bowel Movement   Tolerating Diet: yes         Patient reports pain as 6 on 0-10 scale.    Objective: Vital signs in last 24 hours:  Patient Vitals for the past 24 hrs:  BP Temp Temp src Pulse Resp SpO2  07/08/11 0550 118/57 mmHg 98.4 F (36.9 C) - 71  16  100 %  07/08/11 0249 129/85 mmHg 97.8 F (36.6 C) - 76  18  98 %  07/07/11 2252 170/98 mmHg 98.2 F (36.8 C) - 70  18  99 %  07/07/11 1809 170/81 mmHg 98.3 F (36.8 C) Oral 73  16  99 %  07/07/11 1230 - 97.9 F (36.6 C) - - - 96 %  07/07/11 1009 200/90 mmHg - - - - -  07/07/11 0952 - - - 64  13  100 %  07/07/11 0951 229/100 mmHg - - 65  13  100 %  07/07/11 0950 - - - 64  12  99 %  07/07/11 0949 - - - 66  13  100 %  07/07/11 0948 - - - 62  13  100 %  07/07/11 0947 - - - 63  14  100 %  07/07/11 0946 - - - 62  15  100 %  07/07/11 0945 218/92 mmHg - - 62  14  100 %  07/07/11 0943 - - - 61  18  100 %  07/07/11 0942 - - - 61  17  100 %  07/07/11 0941 - - - 64  15  100 %  07/07/11 0940 - - - 61  16  100 %  07/07/11 0939 - - - 59  15  100 %  07/07/11 0938 237/91 mmHg - - 59  16  100 %  07/07/11 0937 - - - 60  18  100 %  07/07/11 0936 - - - 63  18  100 %  07/07/11 0935 - - - 64  15  100 %  07/07/11 0934 - - - 64  15  99 %  07/07/11 0933 - - - 66  16  99 %  07/07/11 0840 189/111 mmHg - - - - -  07/07/11 0825 217/211 mmHg 97.8 F (36.6 C) Oral 64  18  98 %      Intake/Output from previous day:   01/07 0701 - 01/08 0700 In: 2920 [P.O.:1320; I.V.:1600] Out: 2500 [Urine:2000; Drains:400]   Intake/Output this shift:       Intake/Output      01/07 0701 - 01/08 0700 01/08 0701 - 01/09 0700   P.O. 1320    I.V. 1600    Total Intake 2920    Urine 2000    Drains 400    Blood 100    Total Output 2500    Net +420            LABORATORY DATA:  Basename 07/08/11 0605  WBC 12.4*  HGB 10.7*  HCT 33.7*  PLT 285   No results found for this basename: NA:7,K:7,CL:7,CO2:7,BUN:7,CREATININE:7,GLUCOSE:7,CALCIUM:7 in the last 168 hours Lab Results  Component Value Date   INR 0.99 06/26/2011    Examination:  General  appearance: alert, cooperative and no distress Extremities: Homans sign is negative, no sign of DVT  Wound Exam: clean, dry, intact   Drainage:  Scant/small amount Serosanguinous exudate  Motor Exam EHL and FHL Intact  Sensory Exam Superficial Peroneal normal  Assessment:    1 Day Post-Op  Procedure(s) (LRB): TOTAL KNEE ARTHROPLASTY (Right)  ADDITIONAL DIAGNOSIS:  Active Problems:  * No active hospital problems. *   Acute Blood Loss Anemia   Plan: Physical Therapy as ordered Weight Bearing as Tolerated (WBAT)  DVT Prophylaxis:  Lovenox  DISCHARGE PLAN: Home  DISCHARGE NEEDS: HHPT, CPM, Walker and 3-in-1 comode seat         Chee Dimon 07/08/2011, 8:08 AM

## 2011-07-08 NOTE — Progress Notes (Signed)
UR COMPLETED.  [phone] 

## 2011-07-09 LAB — BASIC METABOLIC PANEL
BUN: 13 mg/dL (ref 6–23)
Calcium: 8.3 mg/dL — ABNORMAL LOW (ref 8.4–10.5)
Creatinine, Ser: 0.89 mg/dL (ref 0.50–1.10)
GFR calc non Af Amer: 66 mL/min — ABNORMAL LOW (ref >90)
Glucose, Bld: 112 mg/dL — ABNORMAL HIGH (ref 70–99)
Sodium: 137 mEq/L (ref 135–145)

## 2011-07-09 LAB — CBC
HCT: 30.1 % — ABNORMAL LOW (ref 36.0–46.0)
Hemoglobin: 9.5 g/dL — ABNORMAL LOW (ref 12.0–15.0)
MCH: 26.8 pg (ref 26.0–34.0)
MCHC: 31.6 g/dL (ref 30.0–36.0)

## 2011-07-09 MED ORDER — OXYCODONE HCL 5 MG PO TABS: 5.0000 mg | ORAL_TABLET | ORAL | Status: AC | PRN

## 2011-07-09 MED ORDER — OXYCODONE HCL 10 MG PO TB12: 10.0000 mg | ORAL_TABLET | Freq: Two times a day (BID) | ORAL | Status: AC

## 2011-07-09 MED ORDER — METHOCARBAMOL 500 MG PO TABS: 500.0000 mg | ORAL_TABLET | Freq: Four times a day (QID) | ORAL | Status: AC | PRN

## 2011-07-09 MED ORDER — ENOXAPARIN SODIUM 40 MG/0.4ML ~~LOC~~ SOLN: 40.0000 mg | SUBCUTANEOUS | Status: DC

## 2011-07-09 NOTE — Progress Notes (Signed)
Physical Therapy Treatment Patient Details Name: Michelle [name] MRN: [id number] DOB: 07/23/1945 Today's Date: 07/09/2011  PT Assessment/Plan  PT - Assessment/Plan Comments on Treatment Session: Very painful this am (RN gave pain meds during session), but stil steady with amb despite pain; will need another session for stair training to consider dc home today PT Plan: Discharge plan remains appropriate PT Frequency: 7X/week Follow Up Recommendations: Home health PT;Supervision/Assistance - 24 hour Equipment Recommended: Rolling walker with 5" wheels;3 in 1 bedside comode PT Goals  Acute Rehab PT Goals Time For Goal Achievement: 7 days Pt will go Supine/Side to Sit: with modified independence PT Goal: Supine/Side to Sit - Progress: Progressing toward goal Pt will go Sit to Supine/Side: with modified independence PT Goal: Sit to Supine/Side - Progress: Progressing toward goal Pt will go Sit to Stand: with supervision PT Goal: Sit to Stand - Progress: Progressing toward goal Pt will go Stand to Sit: with supervision PT Goal: Stand to Sit - Progress: Progressing toward goal Pt will Ambulate: >150 feet;with supervision;with rolling walker PT Goal: Ambulate - Progress: Progressing toward goal Pt will Go Up / Down Stairs: 1-2 stairs;with min assist;with least restrictive assistive device PT Goal: Up/Down Stairs - Progress: Other (comment) Pt will Perform Home Exercise Program: Independently PT Goal: Perform Home Exercise Program - Progress: Other (comment)  PT Treatment Precautions/Restrictions  Precautions Precautions: Fall Restrictions Weight Bearing Restrictions: Yes RLE Weight Bearing: Weight bearing as tolerated Mobility (including Balance) Bed Mobility Supine to Sit: 4: Min assist Supine to Sit Details (indicate cue type and reason): physical assist for RLE Transfers Sit to Stand: 4: Min assist;From bed;From chair/3-in-1;With upper extremity assist Sit to Stand Details (indicate  cue type and reason): Minguard assist progressing to supervision; 2 reps from bed and 3in1 over commode in bathroom; cues for safe hand placement Stand to Sit: 4: Min assist;To chair/3-in-1;With upper extremity assist Stand to Sit Details: cues to preposition RLE for comfort and for safe hand placement Ambulation/Gait Ambulation/Gait Assistance: 5: Supervision Ambulation/Gait Assistance Details (indicate cue type and reason): cues for sequence, and to self-monitor for R knee buckle; no gros knee buckling this session; very short Left step length 2/2 decreased R stance tol because of pain; slow, but steady; pt politely declining stair training this session because of pain; will plan for next session Ambulation Distance (Feet): 30 Feet (to/from bathroom) Assistive device: Rolling walker Gait Pattern: Antalgic    Exercise  Total Joint Exercises (prioritized mobility this session, and pt in a lot of pain) End of Session PT - End of Session Equipment Utilized During Treatment: Gait belt Activity Tolerance: Patient limited by pain Patient left: in chair;with call bell in reach General Behavior During Session: [facility] for tasks performed Cognition: [facility] for tasks performed  Michelle [name] 07/09/2011, 12:40 PM

## 2011-07-09 NOTE — Discharge Summary (Signed)
PATIENT ID:      Michelle [name]  MRN:     [id number] DOB/AGE:    1946/02/27 / 66 y.o.     DISCHARGE SUMMARY  ADMISSION DATE:    07/07/2011 DISCHARGE DATE:   07/09/2011   ADMISSION DIAGNOSIS: RIGHT KNEE PAIN  (OA RIGHT KNEE)  DISCHARGE DIAGNOSIS:  OA RIGHT KNEE    ADDITIONAL DIAGNOSIS: Active Problems:  * No active hospital problems. *   Past Medical History  Diagnosis Date  . Hypertension     takes Enalapril  . Cancer     Melanoma on back- Stage 1  . Pneumonia   . GERD (gastroesophageal reflux disease)     indegestion  . Arthritis     R knee and Left  foot    PROCEDURE: Procedure(s): TOTAL KNEE ARTHROPLASTY on 07/07/2011  CONSULTS:     HISTORY:  See H&P in chart  HOSPITAL COURSE:  Michelle [name] is a 66 y.o. admitted on 07/07/2011 and found to have a diagnosis of OA RIGHT KNEE.  After appropriate laboratory studies were obtained  they were taken to the operating room on 07/07/2011 and underwent Procedure(s): TOTAL KNEE ARTHROPLASTY.   They were given perioperative antibiotics:  Anti-infectives     Start     Dose/Rate Route Frequency Ordered Stop   07/07/11 1600   ceFAZolin (ANCEF) IVPB 1 g/50 mL premix        1 g 100 mL/hr over 30 Minutes Intravenous Every 6 hours 07/07/11 1455 07-23-11 0430   07/06/11 1315   ceFAZolin (ANCEF) IVPB 2 g/50 mL premix        2 g 100 mL/hr over 30 Minutes Intravenous 60 min pre-op 07/06/11 1311 07/07/11 1019        . Blood products given:none   The remainder of the hospital course was dedicated to ambulation and strengthening.   The patient was discharged on 2 Days Post-Op in  Good condition.   DIAGNOSTIC STUDIES: Recent vital signs: Patient Vitals for the past 24 hrs:  BP Temp Temp src Pulse Resp SpO2  07/09/11 0552 97/64 mmHg 98.6 F (37 C) Oral 77  18  93 %  2011/07/23 2100 130/82 mmHg 99.3 F (37.4 C) Oral 84  18  93 %  23-Jul-2011 1500 127/66 mmHg 98.8 F (37.1 C) Tympanic 67  18  98 %  July 23, 2011 1005 110/62 mmHg - - 84  - -        Recent laboratory studies:  Basename 07/09/11 0605 July 23, 2011 0605  WBC 14.6* 12.4*  HGB 9.5* 10.7*  HCT 30.1* 33.7*  PLT 252 285    Basename July 23, 2011 0605  NA 140  K 4.0  CL 102  CO2 28  BUN 12  CREATININE 0.72  GLUCOSE 120*  CALCIUM 8.4   Lab Results  Component Value Date   INR 0.99 06/26/2011     Recent Radiographic Studies :  Dg Chest 2 View  06/26/2011  *RADIOLOGY REPORT*  Clinical Data: Preop for right knee arthroplasty  CHEST - 2 VIEW  Comparison: None.  Findings: Heart size and vascular pattern are normal.  The lungs are clear.  IMPRESSION: No acute abnormalities.  Original Report Authenticated By: [id number]    DISCHARGE INSTRUCTIONS: Discharge Orders    Future Appointments: Provider: Department: Dept Phone: Center:   08/04/2011 1:30 PM Duke Salvia, MD [facility] [phone] [facility]     Future Orders Please Complete By Expires   Diet - low sodium heart healthy  Call MD / Call 911      Comments:   If you experience chest pain or shortness of breath, CALL 911 and be transported to the hospital emergency room.  If you develope a fever above 101 F, pus (white drainage) or increased drainage or redness at the wound, or calf pain, call your surgeon's office.   Constipation Prevention      Comments:   Drink plenty of fluids.  Prune juice may be helpful.  You may use a stool softener, such as Colace (over the counter) 100 mg twice a day.  Use MiraLax (over the counter) for constipation as needed.   Increase activity slowly as tolerated      Weight Bearing as taught in Physical Therapy      Comments:   Use a walker or crutches as instructed.   Patient may shower      Comments:   You may shower without a dressing once there is no drainage.  Do not wash over the wound.  If drainage remains, cover wound with plastic wrap and then shower.   Driving restrictions      Comments:   No driving for 6 weeks   Lifting restrictions      Comments:   No lifting for 6  weeks   CPM      Comments:   Continuous passive motion machine (CPM):      Use the CPM from 0 to 90 for 6-8 hours per day.      You may increase by 10 per day.  You may break it up into 2 or 3 sessions per day.      Use CPM for 2 weeks or until you are told to stop.   TED hose      Comments:   Use stockings (TED hose) for 3 weeks on both leg(s).  You may remove them at night for sleeping.   Change dressing      Comments:   Change dressing on Thursday, then change the dressing daily with sterile 4 x 4 inch gauze dressing and apply TED hose.  You may clean the incision with alcohol prior to redressing.   Do not put a pillow under the knee. Place it under the heel.         DISCHARGE MEDICATIONS:  Current Discharge Medication List    START taking these medications   Details  enoxaparin (LOVENOX) 40 MG/0.4ML SOLN Inject 0.4 mLs (40 mg total) into the skin daily. Qty: 10 Syringe, Refills: 0    methocarbamol (ROBAXIN) 500 MG tablet Take 1 tablet (500 mg total) by mouth every 6 (six) hours as needed. Qty: 60 tablet, Refills: 0    oxyCODONE (OXY IR/ROXICODONE) 5 MG immediate release tablet Take 1-2 tablets (5-10 mg total) by mouth every 3 (three) hours as needed. Qty: 60 tablet, Refills: 0    oxyCODONE (OXYCONTIN) 10 MG 12 hr tablet Take 1 tablet (10 mg total) by mouth every 12 (twelve) hours. Qty: 30 tablet, Refills: 0      CONTINUE these medications which have NOT CHANGED   Details  alendronate (FOSAMAX) 70 MG tablet Take 70 mg by mouth every 7 (seven) days. Take with a full glass of water on an empty stomach. Takes on Sunday.     Calcium Citrate-Vitamin D (CALCIUM CITRATE + D PO) Take 1 tablet by mouth daily.      Cholecalciferol (VITAMIN D3) 5000 UNITS CAPS Take 1 capsule by mouth daily.  enalapril (VASOTEC) 20 MG tablet Take 20 mg by mouth daily.      furosemide (LASIX) 40 MG tablet Take 20 mg by mouth daily.      simvastatin (ZOCOR) 20 MG tablet Take 20 mg by mouth at  bedtime.      temazepam (RESTORIL) 30 MG capsule Take 30 mg by mouth daily.       STOP taking these medications     HYDROcodone-acetaminophen (NORCO) 5-325 MG per tablet         FOLLOW UP VISIT:   Follow-up Information    Call Raymon Mutton, MD.   Contact information:   [address] [place] [address] [phone]          DISPOSITION:  Home  Final discharge disposition not confirmed  CONDITION:  Good   Bemnet Trovato 07/09/2011, 7:33 AM

## 2011-07-09 NOTE — Progress Notes (Signed)
PATIENT ID:      Michelle [name]  MRN:     [id number] DOB/AGE:    May 22, 1946 / 66 y.o.    PROGRESS NOTE Subjective:  negative for Chest Pain  negative for Shortness of Breath  negative for Nausea/Vomiting   negative for Calf Pain  negative for Bowel Movement   Tolerating Diet: yes         Patient reports pain as 4 on 0-10 scale.    Objective: Vital signs in last 24 hours:  Patient Vitals for the past 24 hrs:  BP Temp Temp src Pulse Resp SpO2  07/09/11 0552 97/64 mmHg 98.6 F (37 C) Oral 77  18  93 %  07/08/11 2100 130/82 mmHg 99.3 F (37.4 C) Oral 84  18  93 %  07/08/11 1500 127/66 mmHg 98.8 F (37.1 C) Tympanic 67  18  98 %  07/08/11 1005 110/62 mmHg - - 84  - -      Intake/Output from previous day:   01/08 0701 - 01/09 0700 In: 240 [P.O.:240] Out: 200 [Urine:200]   Intake/Output this shift:       Intake/Output      01/08 0701 - 01/09 0700 01/09 0701 - 01/10 0700   P.O. 240    I.V.     Total Intake 240    Urine 200    Drains     Blood     Total Output 200    Net +40         Urine Occurrence 2 x       LABORATORY DATA:  Basename 07/09/11 0605 07/08/11 0605  WBC 14.6* 12.4*  HGB 9.5* 10.7*  HCT 30.1* 33.7*  PLT 252 285    Basename 07/08/11 0605  NA 140  K 4.0  CL 102  CO2 28  BUN 12  CREATININE 0.72  GLUCOSE 120*  CALCIUM 8.4   Lab Results  Component Value Date   INR 0.99 06/26/2011    Examination:  General appearance: alert, cooperative and no distress Extremities: extremities normal, atraumatic, no cyanosis or edema  Wound Exam: clean, dry, intact   Drainage:  None: wound tissue dry  Motor Exam EHL and FHL Intact  Sensory Exam Superficial Peroneal normal  Assessment:    2 Days Post-Op  Procedure(s) (LRB): TOTAL KNEE ARTHROPLASTY (Right)  ADDITIONAL DIAGNOSIS:  Active Problems:  * No active hospital problems. *   Acute Blood Loss Anemia   Plan: Physical Therapy as ordered Weight Bearing as Tolerated (WBAT)  DVT  Prophylaxis:  Lovenox  DISCHARGE PLAN: Home  DISCHARGE NEEDS: HHPT, CPM, Walker and 3-in-1 comode seat         Tidus Upchurch 07/09/2011, 7:26 AM

## 2011-07-09 NOTE — Progress Notes (Signed)
Physical Therapy Treatment Patient Details Name: Michelle [name] MRN: [id number] DOB: 1945/08/03 Today's Date: 07/09/2011  PT Assessment/Plan   PT Goals     PT Treatment Precautions/Restrictions  Precautions Precautions: Knee Precaution Booklet Issued: No Required Braces or Orthoses: No Restrictions Weight Bearing Restrictions: Yes RLE Weight Bearing: Weight bearing as tolerated Mobility (including Balance) Bed Mobility Supine to Sit: 4: Min assist Supine to Sit Details (indicate cue type and reason): assist for rt. leg Transfers Sit to Stand: 5: Supervision Stand to Sit: 5: Supervision Ambulation/Gait Ambulation/Gait Assistance: 4: Min assist Assistive device: Rolling walker Gait Pattern: Step-to pattern;Antalgic Stairs: Yes Stairs Assistance: 4: Min assist Stair Management Technique: Backwards;With walker Number of Stairs: 2  Wheelchair Mobility Wheelchair Mobility: No    Exercise    End of Session  pt in recliner, nursing made aware that pt is ready to discharge.  Julian Reil 07/09/2011, 3:05 PM

## 2011-07-09 NOTE — Progress Notes (Signed)
CARE MANAGEMENT NOTE 07/09/2011  Discharge planning. Spoke with patient, ffered choice, pt states hospital in her area is [place] and that will be fine. Contacted Eber Jones with [facility], faxed info. They can see pt either 1/10 or 07/11/11. Informed pt. Pt has DME.

## 2011-08-04 ENCOUNTER — Ambulatory Visit (INDEPENDENT_AMBULATORY_CARE_PROVIDER_SITE_OTHER): Payer: PRIVATE HEALTH INSURANCE | Admitting: Internal Medicine

## 2011-08-04 ENCOUNTER — Encounter: Payer: Self-pay | Admitting: Internal Medicine

## 2011-08-04 VITALS — BP 104/60 | HR 68 | Temp 97.5°F | Wt 158.0 lb

## 2011-08-04 DIAGNOSIS — E039 Hypothyroidism, unspecified: Secondary | ICD-10-CM

## 2011-08-04 DIAGNOSIS — I10 Essential (primary) hypertension: Secondary | ICD-10-CM

## 2011-08-04 DIAGNOSIS — I6529 Occlusion and stenosis of unspecified carotid artery: Secondary | ICD-10-CM

## 2011-08-04 DIAGNOSIS — D649 Anemia, unspecified: Secondary | ICD-10-CM

## 2011-08-04 DIAGNOSIS — Z0000 Encounter for general adult medical examination without abnormal findings: Secondary | ICD-10-CM

## 2011-08-04 DIAGNOSIS — E785 Hyperlipidemia, unspecified: Secondary | ICD-10-CM

## 2011-08-04 DIAGNOSIS — K219 Gastro-esophageal reflux disease without esophagitis: Secondary | ICD-10-CM

## 2011-08-04 MED ORDER — SIMVASTATIN 20 MG PO TABS: 20.0000 mg | ORAL_TABLET | Freq: Every day | ORAL | Status: DC

## 2011-08-04 MED ORDER — PANTOPRAZOLE SODIUM 40 MG PO TBEC: 40.0000 mg | DELAYED_RELEASE_TABLET | Freq: Every day | ORAL | Status: DC

## 2011-08-04 MED ORDER — ENALAPRIL MALEATE 20 MG PO TABS: 20.0000 mg | ORAL_TABLET | Freq: Every day | ORAL | Status: DC

## 2011-08-04 MED ORDER — TEMAZEPAM 30 MG PO CAPS: 30.0000 mg | ORAL_CAPSULE | Freq: Every day | ORAL | Status: DC

## 2011-08-04 MED ORDER — ALENDRONATE SODIUM 70 MG PO TABS: 70.0000 mg | ORAL_TABLET | ORAL | Status: DC

## 2011-08-04 MED ORDER — VITAMIN D3 125 MCG (5000 UT) PO CAPS: 1.0000 | ORAL_CAPSULE | Freq: Every day | ORAL | Status: DC

## 2011-08-04 MED ORDER — FERROUS GLUCONATE IRON 246 (28 FE) MG PO TABS: 246.0000 mg | ORAL_TABLET | Freq: Every day | ORAL | Status: DC

## 2011-08-04 MED ORDER — FUROSEMIDE 40 MG PO TABS: 20.0000 mg | ORAL_TABLET | Freq: Every day | ORAL | Status: DC

## 2011-08-04 NOTE — Patient Instructions (Signed)
Good exam. Will have you return in 1 month for lab: to follow-up on anemia that developed during your hospitalization and the cholesterol panel.  Please include iron rich foods in your diet and take an iron tablet once a day.  Blood pressure is great and all your general chemistries, checked while you were in the hospital, were normal.  Please have a copy of the mammogram report sent to me. \   Iron-Rich Diet An iron-rich diet contains foods that are good sources of iron. Iron is an important mineral that helps your body produce hemoglobin. Hemoglobin is a protein in red blood cells that carries oxygen to the body's tissues. Sometimes, the iron level in your blood can be low. This may be caused by:  A lack of iron in your diet.     Blood loss.     Times of growth, such as during pregnancy or during a child's growth and development.  Low levels of iron can cause a decrease in the number of red blood cells. This can result in iron deficiency anemia. Iron deficiency anemia symptoms include:  Tiredness.     Weakness.    Irritability.    Increased chance of infection.  Here are some recommendations for daily iron intake:  Males older than 68 years of age need 8 mg of iron per day.     Women ages 52 to 4 need 18 mg of iron per day.     Pregnant women need 27 mg of iron per day, and women who are over 12 years of age and breastfeeding need 9 mg of iron per day.     Women over the age of 67 need 8 mg of iron per day.  SOURCES OF IRON There are 2 types of iron that are found in food: heme iron and nonheme iron. Heme iron is absorbed by the body better than nonheme iron. Heme iron is found in meat, poultry, and fish. Nonheme iron is found in grains, beans, and vegetables. Heme Iron Sources Food / Iron (mg)  Chicken liver, 3 oz (85 g)/ 10 mg     Beef liver, 3 oz (85 g)/ 5.5 mg     Oysters, 3 oz (85 g)/ 8 mg     Beef, 3 oz (85 g)/ 2 to 3 mg     Shrimp, 3 oz (85 g)/ 2.8 mg      [place], 3 oz (85 g)/ 2 mg     Chicken, 3 oz (85 g) / 1 mg     Fish (tuna, halibut), 3 oz (85 g)/ 1 mg     Pork, 3 oz (85 g)/ 0.9 mg  Nonheme Iron Sources Food / Iron (mg)  Ready-to-eat breakfast cereal, iron-fortified / 3.9 to 7 mg     Tofu,  cup / 3.4 mg     Kidney beans,  cup / 2.6 mg     Baked potato with skin / 2.7 mg     Asparagus,  cup / 2.2 mg     Avocado / 2 mg     Dried peaches,  cup / 1.6 mg     Raisins,  cup / 1.5 mg     Soy milk, 1 cup / 1.5 mg     Whole-wheat bread, 1 slice / 1.2 mg     Spinach, 1 cup / 0.8 mg     Broccoli,  cup / 0.6 mg  IRON ABSORPTION Certain foods can decrease the body's absorption of iron. Try to  avoid these foods and beverages while eating meals with iron-containing foods:  Coffee.     Tea.    Fiber.    Soy.  Foods containing vitamin C can help increase the amount of iron your body absorbs from iron sources, especially from nonheme sources. Eat foods with vitamin C along with iron-containing foods to increase your iron absorption. Foods that are high in vitamin C include many fruits and vegetables. Some good sources are:  Fresh orange juice.     Oranges.    Strawberries.    Mangoes.    Grapefruit.    Red bell peppers.     Green bell peppers.     Broccoli.    Potatoes with skin.     Tomato juice.  Document Released: 01/28/2005 Document Revised: 02/26/2011 Document Reviewed: 12/05/2010 [facility] Patient Information 2012 [place], [place].

## 2011-08-04 NOTE — Progress Notes (Signed)
Subjective:    Patient ID: Michelle [name], female    DOB: 06-Dec-1945, 66 y.o.   MRN: [id number]  HPI The patient is here for annual Medicare wellness examination and management of other chronic and acute problems.  She does c/o upper abdominal pain and numbness along with what sounds like reflux.   She is s/p TKR right January '13. (Dr. Valentina Gu)- doing rehab [facility].    The risk factors are reflected in the social history.  The roster of all physicians providing medical care to patient - is listed in the Snapshot section of the chart.  Activities of daily living:  The patient is 100% inedpendent in all ADLs: dressing, toileting, feeding as well as independent mobility  Home safety : The patient has smoke detectors in the home. 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 They wear seatbelts.  Firearms are present in the home, kept in a safe fashion. There is no violence in the home.   There is no risks for hepatitis, STDs or HIV. There is no history of blood transfusion. They have no travel history to infectious disease endemic areas of the world.  The patient has seen their dentist in the last six month. They have seen their eye doctor in the last year. They deny any hearing difficulty and have not had audiologic testing in the last year.  They do not  have excessive sun exposure. Discussed the need for sun protection: hats, long sleeves and use of sunscreen if there is significant sun exposure.   Diet: the importance of a healthy diet is discussed. They do have a healthy diet.  The patient has no regular exercise program.  The benefits of regular aerobic exercise were discussed.  Depression screen: there are some signs or vegative symptoms of depression- irritability, change in appetite, anhedonia, sadness/tearfullness.  Cognitive assessment: the patient manages all their financial and personal affairs and is actively engaged.   The following portions of the patient's history  were reviewed and updated as appropriate: allergies, current medications, past family history, past medical history,  past surgical history, past social history  and problem list.  Vision, hearing, body mass index were assessed and reviewed.   During the course of the visit the patient was educated and counseled about appropriate screening and preventive services including : fall prevention , diabetes screening, nutrition counseling, colorectal cancer screening, and recommended immunizations.      Review of Systems Constitutional:  Negative for fever, chills, activity change and unexpected weight change.  HEENT:  Negative for hearing loss, ear pain, congestion, neck stiffness and postnasal drip. Negative for sore throat or swallowing problems. Negative for dental complaints.   Eyes: Negative for vision loss or change in visual acuity.  Respiratory: Negative for chest tightness and wheezing. Negative for DOE.   Cardiovascular: Negative for chest pain or palpitations. No decreased exercise tolerance Gastrointestinal: No change in bowel habit. No bloating or gas. No reflux or indigestion Genitourinary: Negative for urgency, frequency, flank pain and difficulty urinating.  Musculoskeletal: Positive for myalgias, back pain, arthralgias and gait problem. S/p TKR doing rehab Neurological: Negative for dizziness, tremors, weakness and headaches.  Hematological: Negative for adenopathy.  Psychiatric/Behavioral: Negative for behavioral problems and dysphoric mood.       Objective:   Physical Exam Filed Vitals:   08/04/11 1342  BP: 104/60  Pulse: 68  Temp: 97.5 F (36.4 C)   Gen'l: well nourished, well developed white woman in no distress HEENT - /AT, EACs/TMs  normal, oropharynx with native dentition in good condition, no buccal or palatal lesions, posterior pharynx clear, mucous membranes moist. C&S clear, PERRLA, fundi - normal Neck - supple, no thyromegaly Nodes- negative submental,  cervical, supraclavicular regions Chest - no deformity, no CVAT Lungs - cleat without rales, wheezes. No increased work of breathing Breast - deferred to gyn Cardiovascular - regular rate and rhythm, quiet precordium, no murmurs, rubs or gallops, 2+ radial, DP and PT pulses Abdomen - BS+ x 4, no HSM, no guarding or rebound or tenderness Pelvic - deferred to gyn Rectal - deferred to gyn Extremities - no clubbing, cyanosis, edema or deformity.  Neuro - A&O x 3, CN II-XII normal, motor strength normal and equal, DTRs 2+ and symmetrical biceps, radial, and patellar tendons. Cerebellar - no tremor, no rigidity, fluid movement and normal gait. Derm - Head, neck, back, abdomen and extremities without suspicious lesions           Assessment & Plan:

## 2011-08-05 DIAGNOSIS — Z0000 Encounter for general adult medical examination without abnormal findings: Secondary | ICD-10-CM | POA: Insufficient documentation

## 2011-08-05 DIAGNOSIS — D649 Anemia, unspecified: Secondary | ICD-10-CM | POA: Insufficient documentation

## 2011-08-05 NOTE — Assessment & Plan Note (Signed)
Post op anemia but not significant symptomatic.  Plan - oral iron and iron rich diet          F/u lab in 1 month

## 2011-08-05 NOTE — Assessment & Plan Note (Signed)
Previously out of control. She reports that she has been taking her "statin" medication.  Plan - lipid panel in one month with recommendations to follow

## 2011-08-05 NOTE — Assessment & Plan Note (Signed)
BP Readings from Last 3 Encounters:  08/04/11 104/60  07/09/11 135/65  07/09/11 135/65   Good control on present regimen

## 2011-08-05 NOTE — Assessment & Plan Note (Signed)
Since her TKR in January '13 she has has epigastric pain and substernal discomfort. This was relieved with PPI. Exam with tenderness c/w gild gastric irritation. No h/o hemetemesis or melena  Plan - PPI therapy daily

## 2011-08-05 NOTE — Assessment & Plan Note (Signed)
Lab Results  Component Value Date   TSH 2.23 08/14/2010   Repeat lab ordered for March '13 recommendations on dosing to follow.

## 2011-08-05 NOTE — Assessment & Plan Note (Signed)
Interval medical history significant for right TKR, post-op anemia and continued malaise. Physical exam, sans breast and pelvic,is normal x/ epigastric tenderness and nicely healing TKR scar. Labs are pending. She is current with her gynecologist and is current for mammography. No colonoscopy in EPIC. She is current with immunizations.  In summary - a very nice woman who does appear medically stable but who has new on-set anemia post operatively. She will start iron replacement and return for lab. She will otherwise return as needed.

## 2011-08-05 NOTE — Assessment & Plan Note (Signed)
Stable - no events or symptoms

## 2011-09-02 ENCOUNTER — Other Ambulatory Visit (INDEPENDENT_AMBULATORY_CARE_PROVIDER_SITE_OTHER): Payer: Medicare Other

## 2011-09-02 DIAGNOSIS — D649 Anemia, unspecified: Secondary | ICD-10-CM

## 2011-09-02 DIAGNOSIS — E039 Hypothyroidism, unspecified: Secondary | ICD-10-CM

## 2011-09-02 DIAGNOSIS — E785 Hyperlipidemia, unspecified: Secondary | ICD-10-CM

## 2011-09-02 LAB — IBC PANEL: Iron: 45 ug/dL (ref 42–145)

## 2011-09-02 LAB — LIPID PANEL
HDL: 53.1 mg/dL (ref >39.00)
Total CHOL/HDL Ratio: 3
VLDL: 13.6 mg/dL (ref 0.0–40.0)

## 2011-09-02 LAB — CBC WITH DIFFERENTIAL/PLATELET
Basophils Absolute: 0.1 10*3/uL (ref 0.0–0.1)
Basophils Relative: 0.9 % (ref 0.0–3.0)
Eosinophils Absolute: 0.1 10*3/uL (ref 0.0–0.7)
HCT: 39.1 % (ref 36.0–46.0)
Hemoglobin: 12.9 g/dL (ref 12.0–15.0)
Lymphocytes Relative: 20.7 % (ref 12.0–46.0)
Lymphs Abs: 1.5 10*3/uL (ref 0.7–4.0)
MCHC: 32.8 g/dL (ref 30.0–36.0)
Neutro Abs: 4.7 10*3/uL (ref 1.4–7.7)
RBC: 4.8 Mil/uL (ref 3.87–5.11)
RDW: 15.4 % — ABNORMAL HIGH (ref 11.5–14.6)

## 2011-09-02 LAB — TSH: TSH: 2.5 u[IU]/mL (ref 0.35–5.50)

## 2011-09-04 ENCOUNTER — Encounter: Payer: Self-pay | Admitting: Internal Medicine

## 2011-09-25 ENCOUNTER — Other Ambulatory Visit: Payer: Self-pay | Admitting: Internal Medicine

## 2011-09-29 ENCOUNTER — Other Ambulatory Visit: Payer: Self-pay | Admitting: Internal Medicine

## 2011-09-30 NOTE — Telephone Encounter (Signed)
Taztia XT 360 mg R-24 HR capsule request [last refill 02.21.12 #30x12]. Rx not abstracted into Epic, please advise on medication status.

## 2011-10-10 ENCOUNTER — Ambulatory Visit: Payer: Self-pay | Admitting: Obstetrics and Gynecology

## 2011-12-22 ENCOUNTER — Encounter: Payer: Self-pay | Admitting: *Deleted

## 2011-12-22 ENCOUNTER — Ambulatory Visit (INDEPENDENT_AMBULATORY_CARE_PROVIDER_SITE_OTHER): Payer: PRIVATE HEALTH INSURANCE | Admitting: Internal Medicine

## 2011-12-22 VITALS — BP 128/82 | HR 60 | Temp 97.7°F | Resp 16 | Wt 163.8 lb

## 2011-12-22 DIAGNOSIS — A084 Viral intestinal infection, unspecified: Secondary | ICD-10-CM

## 2011-12-22 DIAGNOSIS — R5383 Other fatigue: Secondary | ICD-10-CM

## 2011-12-22 DIAGNOSIS — R5381 Other malaise: Secondary | ICD-10-CM

## 2011-12-22 DIAGNOSIS — A088 Other specified intestinal infections: Secondary | ICD-10-CM

## 2011-12-22 MED ORDER — CYANOCOBALAMIN 1000 MCG/ML IJ SOLN
1000.0000 ug | Freq: Once | INTRAMUSCULAR | Status: AC
  Administered 2011-12-22: 1000 ug via INTRAMUSCULAR

## 2011-12-22 MED ORDER — PROMETHAZINE HCL 12.5 MG PO TABS: 12.5000 mg | ORAL_TABLET | Freq: Three times a day (TID) | ORAL | Status: DC | PRN

## 2011-12-22 NOTE — Progress Notes (Signed)
Subjective:    Patient ID: Michelle [name], female    DOB: 1946-06-21, 66 y.o.   MRN: [id number]  HPI Michelle [name] had an episode of myalgias, dyspepsia, N/V and diarrhea for two days about two weeks. She never got to feeling fully recovered with mild dypsepsia but myalgias resolved. Four days later she had recurrent myalgias, N/V/D and total exhausted. A week prior to her illness she had been to [facility]. Two daughters also experienced the same illness. Stayed at [employer] and ate in restaurants. She reports that she has persistent nausea, abdominal pain in the upper abdomen. The diarrhea has cleared.  During all this she felt feverish and had sweats but no documented fever. No one was sick on the plane. She hasn't taken any medication except anacin. Did continue protonix.  Past Medical History  Diagnosis Date  . Hypertension     takes Enalapril  . Cancer     Melanoma on back- Stage 1  . Pneumonia   . GERD (gastroesophageal reflux disease)     indegestion  . Arthritis     R knee and Left  foot  . Endometrial hyperplasia, unspecified   . Disorders of bursae and tendons in shoulder region, unspecified   . Unspecified vitamin D deficiency   . Other malaise and fatigue   . Unspecified hypothyroidism   . Melanoma of skin, site unspecified   . Family history of malignant neoplasm of gastrointestinal tract   . Other and unspecified hyperlipidemia    Past Surgical History  Procedure Date  . Knee arthroscopy 2012  . Tumor removal 2009    From uterus  . Rotator cuff repair 2010    Left  . 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 Tubal ligation   . Melanoma excision 2006    near scapula  . Tumor excision 2008    endometrial   Family History  Problem Relation Age of Onset  . Emphysema Father     deceased at 72  . Heart failure Father     CHF  .  Hypertension Mother     deceased at 20  . Other Mother     DJD  . Heart disease Sister     deceased  . Arthritis Sister     x2  . Drug abuse Sister     deceased; drug overdose  . Hypertension Sister   . Thyroid disease Sister   . Hypertension Brother   . Hyperlipidemia Brother   . Heart attack Paternal Grandmother   . Heart attack Paternal Uncle   . Coronary artery disease Other     Strong Hx paternal kinship  . Breast cancer Neg Hx   . Ovarian cancer Neg Hx    History   Social History  . Marital Status: Married    Spouse Name: N/A    Number of Children: 4  . Years of Education: N/A   Occupational History  .     Social History Main Topics  . Smoking status: Never Smoker   . Smokeless tobacco: Not on file  . Alcohol Use: No  . Drug Use: No  . Sexually Active:    Other Topics Concern  . Not on file   Social History Narrative   Married '63; 1 son '63, 3 daughter-'66, '79, '81; grandchildren 7 [18 to 1 year]Work: did work in [employer]  company.    Current Outpatient Prescriptions on File Prior to Visit  Medication Sig Dispense Refill  . alendronate (FOSAMAX) 70 MG tablet Take 1 tablet (70 mg total) by mouth every 7 (seven) days. Take with a full glass of water on an empty stomach. Takes on Sunday.  30 tablet  11  . Calcium Citrate-Vitamin D (CALCIUM CITRATE + D PO) Take 1 tablet by mouth daily.        . Cholecalciferol (VITAMIN D3) 5000 UNITS CAPS Take 1 capsule by mouth daily.  30 capsule  11  . enalapril (VASOTEC) 20 MG tablet Take 1 tablet (20 mg total) by mouth daily.  30 tablet  11  . ferrous gluconate (FERGON) 246 (28 FE) MG tablet Take 1 tablet (246 mg total) by mouth daily with breakfast.  30 tablet  11  . furosemide (LASIX) 40 MG tablet Take 0.5 tablets (20 mg total) by mouth daily.  30 tablet  11  . oxyCODONE (OXY IR/ROXICODONE) 5 MG immediate release tablet Take 5 mg by mouth every 4 (four) hours as needed.      . pantoprazole (PROTONIX) 40 MG tablet Take  1 tablet (40 mg total) by mouth daily.  30 tablet  11  . simvastatin (ZOCOR) 20 MG tablet Take 1 tablet (20 mg total) by mouth at bedtime.  30 tablet  11  . TAZTIA XT 360 MG 24 hr capsule TAKE ONE CAPSULE BY MOUTH EVERY DAY  90 each  3  . temazepam (RESTORIL) 30 MG capsule Take 1 capsule (30 mg total) by mouth daily.  30 capsule  5  . enoxaparin (LOVENOX) 40 MG/0.4ML SOLN Inject 0.4 mLs (40 mg total) into the skin daily.  10 Syringe  0  . methocarbamol (ROBAXIN) 500 MG tablet 500 mg. Take 1-2 every 6hrs prn            Review of Systems System review is negative for any constitutional, cardiac, pulmonary, GI or neuro symptoms or complaints other than as described in the HPI.     Objective:   Physical Exam Filed Vitals:   12/22/11 1527  BP: 128/82  Pulse: 60  Temp: 97.7 F (36.5 C)  Resp: 16   Wt Readings from Last 3 Encounters:  12/22/11 163 lb 12 oz (74.277 kg)  08/04/11 158 lb (71.668 kg)  06/26/11 171 lb 4.8 oz (77.7 kg)   Gen'l - overweight white woman in no acute distress HEENT - C&S clear  Neck -supple Nodes - negative cervical and supraclavicular regions Cor- RRR Pulm - normal respirations, CTAP Abd - protuberant, BS + x 4, no guarding or rebound, not tender.        Assessment & Plan:  Viral gastroenteritis - patient hand out provided. Plan - supportive care  Promethazine 12.5 mg PO q 6 prn nausea  OTC Immodium as needed

## 2011-12-22 NOTE — Patient Instructions (Addendum)
Virag gastroenteritis - your symptoms are consistent with a viral gastroenteritis - a stomach flu - that can cause pain, Nausea, diarrhea, diffuse aching all over. There is no evidence of a bacterial infection or blocked bowel or anything more serious.   Plan  1. Hydrate and follow a BRAT diet (bananas, rice, applesauce,Toast = bland foods)  2. Continue with the Protonix  3. For nausea - promethazine 12.5 mg every 6 hours as needed. ` 4. For recurrent diarrhea - over the counter immodium as directed  5. Call for fever, nausea/vomiting that doesn't respond to medication, bloody stool or increased pain.  Low energy - although we have no evidence of low B12 we can try 1,ooo mcg B12 shot.   Viral Gastroenteritis Viral gastroenteritis is also known as stomach flu. This condition affects the stomach and intestinal tract. It can cause sudden diarrhea and vomiting. The illness typically lasts 3 to 8 days. Most people develop an immune response that eventually gets rid of the virus. While this natural response develops, the virus can make you quite ill. CAUSES   Many different viruses can cause gastroenteritis, such as rotavirus or noroviruses. You can catch one of these viruses by consuming contaminated food or water. You may also catch a virus by sharing utensils or other personal items with an infected person or by touching a contaminated surface. SYMPTOMS   The most common symptoms are diarrhea and vomiting. These problems can cause a severe loss of body fluids (dehydration) and a body salt (electrolyte) imbalance. Other symptoms may include:  Fever.   Headache.   Fatigue.   Abdominal pain.  DIAGNOSIS   Your caregiver can usually diagnose viral gastroenteritis based on your symptoms and a physical exam. A stool sample may also be taken to test for the presence of viruses or other infections. TREATMENT   This illness typically goes away on its own. Treatments are aimed at rehydration. The most  serious cases of viral gastroenteritis involve vomiting so severely that you are not able to keep fluids down. In these cases, fluids must be given through an intravenous line (IV). HOME CARE INSTRUCTIONS    Drink enough fluids to keep your urine clear or pale yellow. Drink small amounts of fluids frequently and increase the amounts as tolerated.   Ask your caregiver for specific rehydration instructions.   Avoid:   Foods high in sugar.   Alcohol.   Carbonated drinks.   Tobacco.   Juice.   Caffeine drinks.   Extremely hot or cold fluids.   Fatty, greasy foods.   Too much intake of anything at one time.   Dairy products until 24 to 48 hours after diarrhea stops.   You may consume probiotics. Probiotics are active cultures of beneficial bacteria. They may lessen the amount and number of diarrheal stools in adults. Probiotics can be found in yogurt with active cultures and in supplements.   Wash your hands well to avoid spreading the virus.   Only take over-the-counter or prescription medicines for pain, discomfort, or fever as directed by your caregiver. Do not give aspirin to children. Antidiarrheal medicines are not recommended.   Ask your caregiver if you should continue to take your regular prescribed and over-the-counter medicines.   Keep all follow-up appointments as directed by your caregiver.  SEEK IMMEDIATE MEDICAL CARE IF:    You are unable to keep fluids down.   You do not urinate at least once every 6 to 8 hours.   You develop  shortness of breath.   You notice blood in your stool or vomit. This may look like coffee grounds.   You have abdominal pain that increases or is concentrated in one small area (localized).   You have persistent vomiting or diarrhea.   You have a fever.   The patient is a child younger than 3 months, and he or she has a fever.   The patient is a child older than 3 months, and he or she has a fever and persistent symptoms.    The patient is a child older than 3 months, and he or she has a fever and symptoms suddenly get worse.   The patient is a baby, and he or she has no tears when crying.  MAKE SURE YOU:    Understand these instructions.   Will watch your condition.   Will get help right away if you are not doing well or get worse.  Document Released: 06/16/2005 Document Revised: 06/05/2011 Document Reviewed: 04/02/2011 [facility] Patient Information 2012 [place], [place].

## 2012-03-02 ENCOUNTER — Other Ambulatory Visit: Payer: Self-pay | Admitting: Internal Medicine

## 2012-03-02 NOTE — Telephone Encounter (Signed)
Patient request refill on temazepam 30mg . Last office visit 11/2011

## 2012-03-04 ENCOUNTER — Other Ambulatory Visit: Payer: Self-pay | Admitting: *Deleted

## 2012-03-04 MED ORDER — TEMAZEPAM 30 MG PO CAPS: 30.0000 mg | ORAL_CAPSULE | Freq: Every day | ORAL | Status: DC

## 2012-03-04 NOTE — Telephone Encounter (Signed)
Refill temazapam phoned in to [facility]

## 2012-05-06 ENCOUNTER — Encounter: Payer: Self-pay | Admitting: Internal Medicine

## 2012-05-06 ENCOUNTER — Ambulatory Visit (INDEPENDENT_AMBULATORY_CARE_PROVIDER_SITE_OTHER): Payer: PRIVATE HEALTH INSURANCE | Admitting: Internal Medicine

## 2012-05-06 VITALS — BP 164/82 | HR 70 | Temp 97.3°F | Resp 16 | Wt 172.0 lb

## 2012-05-06 DIAGNOSIS — M778 Other enthesopathies, not elsewhere classified: Secondary | ICD-10-CM

## 2012-05-06 DIAGNOSIS — M6789 Other specified disorders of synovium and tendon, multiple sites: Secondary | ICD-10-CM

## 2012-05-06 DIAGNOSIS — M65839 Other synovitis and tenosynovitis, unspecified forearm: Secondary | ICD-10-CM

## 2012-05-06 DIAGNOSIS — M65849 Other synovitis and tenosynovitis, unspecified hand: Secondary | ICD-10-CM

## 2012-05-06 DIAGNOSIS — M6790 Unspecified disorder of synovium and tendon, unspecified site: Secondary | ICD-10-CM

## 2012-05-06 MED ORDER — OXYCODONE HCL 5 MG PO TABS: 5.0000 mg | ORAL_TABLET | ORAL | Status: DC | PRN

## 2012-05-06 MED ORDER — PRAVASTATIN SODIUM 40 MG PO TABS: 40.0000 mg | ORAL_TABLET | Freq: Every day | ORAL | Status: DC

## 2012-05-06 NOTE — Patient Instructions (Addendum)
Tendon sheath nodule base of right thumb - mildly tender with extension and to pressure. Most likely a tendon nodule with some inflammation. Long acting cortisone injection today and will refer to Dr. Butler Denmark at [facility] orth[facility] (hand specialist) for evaluation and further treatment.   Plan - no restrictions on use.

## 2012-05-09 NOTE — Progress Notes (Signed)
Subjective:    Patient ID: Michelle [name], female    DOB: 1946/04/05, 66 y.o.   MRN: [id number]  HPI Michelle [name] presents for evaluation of pain at the base of the right thumb where she also has a small nodule. The pain started slowly and become worse over time. She denies any injury or overuse. She has good ROM and denies loss of strength or function of the right hand.  PMH, FamHx and SocHx reviewed for any changes and relevance.  Current Outpatient Prescriptions on File Prior to Visit  Medication Sig Dispense Refill  . alendronate (FOSAMAX) 70 MG tablet Take 1 tablet (70 mg total) by mouth every 7 (seven) days. Take with a full glass of water on an empty stomach. Takes on Sunday.  30 tablet  11  . Calcium Citrate-Vitamin D (CALCIUM CITRATE + D PO) Take 1 tablet by mouth daily.        . Cholecalciferol (VITAMIN D3) 5000 UNITS CAPS Take 1 capsule by mouth daily.  30 capsule  11  . enalapril (VASOTEC) 20 MG tablet Take 1 tablet (20 mg total) by mouth daily.  30 tablet  11  . ferrous gluconate (FERGON) 246 (28 FE) MG tablet Take 1 tablet (246 mg total) by mouth daily with breakfast.  30 tablet  11  . furosemide (LASIX) 40 MG tablet Take 0.5 tablets (20 mg total) by mouth daily.  30 tablet  11  . pantoprazole (PROTONIX) 40 MG tablet Take 1 tablet (40 mg total) by mouth daily.  30 tablet  11  . TAZTIA XT 360 MG 24 hr capsule TAKE ONE CAPSULE BY MOUTH EVERY DAY  90 each  3  . temazepam (RESTORIL) 30 MG capsule Take 1 capsule (30 mg total) by mouth daily.  30 capsule  5  . enoxaparin (LOVENOX) 40 MG/0.4ML SOLN Inject 0.4 mLs (40 mg total) into the skin daily.  10 Syringe  0  . methocarbamol (ROBAXIN) 500 MG tablet 500 mg. Take 1-2 every 6hrs prn      . pravastatin (PRAVACHOL) 40 MG tablet Take 1 tablet (40 mg total) by mouth daily.  30 tablet  11  . promethazine (PHENERGAN) 12.5 MG tablet Take 1 tablet (12.5 mg total) by mouth every 8 (eight) hours as needed for nausea.  20 tablet  2      Review  of Systems System review is negative for any constitutional, cardiac, pulmonary, GI or neuro symptoms or complaints other than as described in the HPI.     Objective:   Physical Exam Filed Vitals:   05/06/12 1413  BP: 164/82  Pulse: 70  Temp: 97.3 F (36.3 C)  Resp: 16   Gen'l- WNWD overweight white woman in no distress MSK right hand w/o deformity. Mildly positive Finkelstein's maneuver, tender to palpation at the flexor pollicis longus at the anatomic snuff box with a small tendon nodule. Normal grip strength noted.  Procedure bursal injection - tendon sheath flexor tendon right thumb Indication - localized pain Consent - informed verbal consent from patient after explanation of risks of bleeding and infection Prep - injection site identified, prepped with betadine followed by alcohol. Med -    40  Mg depomedrol with  o,5  Cc 2% xylocain w/ epi Injection - bursa-tendon space entered easily. Injected without difficulty. Patient tolerated this well. Post-procedure - patient with rapid reduction in discomfort. Bandaid applied. Routine precautions provided including instruction to return for fever, drainage or increased pain  Assessment & Plan:  Tendonitis - hallis pollicis longus, s/p steroid injection  Plan Patient had prompt relief of pain  Referral to Dr. Butler Denmark for orthopedics for definitive treatment.

## 2012-06-30 DIAGNOSIS — Z8709 Personal history of other diseases of the respiratory system: Secondary | ICD-10-CM

## 2012-06-30 HISTORY — DX: Personal history of other diseases of the respiratory system: Z87.09

## 2012-07-07 ENCOUNTER — Encounter: Payer: Self-pay | Admitting: Internal Medicine

## 2012-07-07 ENCOUNTER — Other Ambulatory Visit (INDEPENDENT_AMBULATORY_CARE_PROVIDER_SITE_OTHER): Payer: PRIVATE HEALTH INSURANCE

## 2012-07-07 ENCOUNTER — Ambulatory Visit (INDEPENDENT_AMBULATORY_CARE_PROVIDER_SITE_OTHER): Payer: PRIVATE HEALTH INSURANCE | Admitting: Internal Medicine

## 2012-07-07 VITALS — BP 132/68 | HR 59 | Temp 96.9°F | Resp 10 | Ht 59.0 in | Wt 170.0 lb

## 2012-07-07 DIAGNOSIS — I10 Essential (primary) hypertension: Secondary | ICD-10-CM

## 2012-07-07 DIAGNOSIS — M67919 Unspecified disorder of synovium and tendon, unspecified shoulder: Secondary | ICD-10-CM

## 2012-07-07 DIAGNOSIS — E039 Hypothyroidism, unspecified: Secondary | ICD-10-CM

## 2012-07-07 DIAGNOSIS — D649 Anemia, unspecified: Secondary | ICD-10-CM

## 2012-07-07 DIAGNOSIS — E559 Vitamin D deficiency, unspecified: Secondary | ICD-10-CM

## 2012-07-07 DIAGNOSIS — E785 Hyperlipidemia, unspecified: Secondary | ICD-10-CM

## 2012-07-07 DIAGNOSIS — Z0000 Encounter for general adult medical examination without abnormal findings: Secondary | ICD-10-CM

## 2012-07-07 DIAGNOSIS — M719 Bursopathy, unspecified: Secondary | ICD-10-CM

## 2012-07-07 LAB — CBC WITH DIFFERENTIAL/PLATELET
Basophils Relative: 0.7 % (ref 0.0–3.0)
Eosinophils Relative: 1.3 % (ref 0.0–5.0)
HCT: 44.5 % (ref 36.0–46.0)
MCV: 81.6 fl (ref 78.0–100.0)
Monocytes Absolute: 0.7 10*3/uL (ref 0.1–1.0)
Monocytes Relative: 8.3 % (ref 3.0–12.0)
Neutrophils Relative %: 70.7 % (ref 43.0–77.0)
Platelets: 313 10*3/uL (ref 150.0–400.0)
RBC: 5.45 Mil/uL — ABNORMAL HIGH (ref 3.87–5.11)
WBC: 8.3 10*3/uL (ref 4.5–10.5)

## 2012-07-07 LAB — LIPID PANEL
Cholesterol: 174 mg/dL (ref 0–200)
LDL Cholesterol: 116 mg/dL — ABNORMAL HIGH (ref 0–99)
Total CHOL/HDL Ratio: 4
Triglycerides: 57 mg/dL (ref 0.0–149.0)
VLDL: 11.4 mg/dL (ref 0.0–40.0)

## 2012-07-07 LAB — COMPREHENSIVE METABOLIC PANEL
ALT: 17 U/L (ref 0–35)
CO2: 33 mEq/L — ABNORMAL HIGH (ref 19–32)
Calcium: 10.1 mg/dL (ref 8.4–10.5)
Chloride: 100 mEq/L (ref 96–112)
Creatinine, Ser: 0.8 mg/dL (ref 0.4–1.2)
GFR: 76.78 mL/min (ref >60.00)
Glucose, Bld: 87 mg/dL (ref 70–99)
Sodium: 141 mEq/L (ref 135–145)
Total Bilirubin: 0.6 mg/dL (ref 0.3–1.2)
Total Protein: 8 g/dL (ref 6.0–8.3)

## 2012-07-07 LAB — HEPATIC FUNCTION PANEL
Albumin: 4.4 g/dL (ref 3.5–5.2)
Total Protein: 8 g/dL (ref 6.0–8.3)

## 2012-07-07 LAB — IBC PANEL: Iron: 73 ug/dL (ref 42–145)

## 2012-07-07 LAB — TSH: TSH: 1.02 u[IU]/mL (ref 0.35–5.50)

## 2012-07-07 MED ORDER — HYDROCODONE-ACETAMINOPHEN 5-325 MG PO TABS: 1.0000 | ORAL_TABLET | Freq: Four times a day (QID) | ORAL | Status: DC | PRN

## 2012-07-07 NOTE — Patient Instructions (Addendum)
Thanks for coming to see me and thanks for the Nutcake - we'll enjoy that in a month or two.  Your exam is fine. Labs today and results will be in the report I send you and you can get it via MyChart if you open an account.  I hope to not see you until next year, but I am here if needed.

## 2012-07-07 NOTE — Progress Notes (Signed)
Subjective:    Patient ID: Michelle [name], female    DOB: 24-Aug-1945, 67 y.o.   MRN: [id number]  HPI The patient is here for annual Medicare wellness examination and management of other chronic and acute problems.  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 She had a steroid injection in the office and did see a hand surgeon in consultation.  She has not had any other problems, surgery or injury.    The risk factors are reflected in the social history.  The roster of all physicians providing medical care to patient - is listed in the Snapshot section of the chart.  Activities of daily living:  The patient is 100% inedpendent in all ADLs: dressing, toileting, feeding as well as independent mobility  Home safety : The patient has smoke detectors in the home. Fall- No falls. home is fall safe including grab bars. They wear seatbelts. firearms are present in the home, kept in a safe fashion. There is no violence in the home.   There is no risks for hepatitis, STDs or HIV. There is no   history of blood transfusion. They have no travel history to infectious disease endemic areas of the world.  The patient has seen their dentist in the last six month. They have not seen their eye doctor in the last year. They deny any hearing difficulty and have not had audiologic testing in the last year.    They do not  have excessive sun exposure. Discussed the need for sun protection: hats, long sleeves and use of sunscreen if there is significant sun exposure.   Diet: the importance of a healthy diet is discussed. They do have a healthy  diet.  The patient has a regular exercise program: PT exercise/on bicycle , 10-15 duration,  1-2 per week.  The benefits of regular aerobic exercise were discussed.  Depression screen: there are no signs or vegative symptoms of depression- irritability, change in appetite, anhedonia, sadness/tearfullness.  Cognitive assessment: the patient  manages all their financial and personal affairs and is actively engaged.   The following portions of the patient's history were reviewed and updated as appropriate: allergies, current medications, past family history, past medical history,  past surgical history, past social history  and problem list.  Past Medical History  Diagnosis Date  . Hypertension     takes Enalapril  . Cancer     Melanoma on back- Stage 1  . Pneumonia   . GERD (gastroesophageal reflux disease)     indegestion  . Arthritis     R knee and Left  foot  . Endometrial hyperplasia, unspecified   . Disorders of bursae and tendons in shoulder region, unspecified   . Unspecified vitamin D deficiency   . Other malaise and fatigue   . Unspecified hypothyroidism   . Melanoma of skin, site unspecified   . Family history of malignant neoplasm of gastrointestinal tract   . Other and unspecified hyperlipidemia    Past Surgical History  Procedure Date  . Knee arthroscopy 2012  . Tumor removal 2009    From uterus  . Rotator cuff repair 2010    Left  . 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 Tubal ligation   . Melanoma excision 2006    near scapula  . Tumor excision 2008  endometrial   Family History  Problem Relation Age of Onset  . Emphysema Father     deceased at 81  . Heart failure Father     CHF  . Hypertension Mother     deceased at 30  . Other Mother     DJD  . Heart disease Sister     deceased  . Arthritis Sister     x2  . Drug abuse Sister     deceased; drug overdose  . Hypertension Sister   . Thyroid disease Sister   . Hypertension Brother   . Hyperlipidemia Brother   . Heart attack Paternal Grandmother   . Heart attack Paternal Uncle   . Coronary artery disease Other     Strong Hx paternal kinship  . Breast cancer Neg Hx   . Ovarian cancer Neg Hx    History     Social History  . Marital Status: Married    Spouse Name: N/A    Number of Children: 4  . Years of Education: N/A   Occupational History  .     Social History Main Topics  . Smoking status: Never Smoker   . Smokeless tobacco: Not on file  . Alcohol Use: No  . Drug Use: No  . Sexually Active:    Other Topics Concern  . Not on file   Social History Narrative   Married '63; 1 son '63, 3 daughter-'66, '79, '81; grandchildren 7 [18 to 1 year]Work: did work in [employer].     Current Outpatient Prescriptions on File Prior to Visit  Medication Sig Dispense Refill  . alendronate (FOSAMAX) 70 MG tablet Take 1 tablet (70 mg total) by mouth every 7 (seven) days. Take with a full glass of water on an empty stomach. Takes on Sunday.  30 tablet  11  . Calcium Citrate-Vitamin D (CALCIUM CITRATE + D PO) Take 1 tablet by mouth daily.        . Cholecalciferol (VITAMIN D3) 5000 UNITS CAPS Take 1 capsule by mouth daily.  30 capsule  11  . enalapril (VASOTEC) 20 MG tablet Take 1 tablet (20 mg total) by mouth daily.  30 tablet  11  . enoxaparin (LOVENOX) 40 MG/0.4ML SOLN Inject 0.4 mLs (40 mg total) into the skin daily.  10 Syringe  0  . ferrous gluconate (FERGON) 246 (28 FE) MG tablet Take 1 tablet (246 mg total) by mouth daily with breakfast.  30 tablet  11  . furosemide (LASIX) 40 MG tablet Take 0.5 tablets (20 mg total) by mouth daily.  30 tablet  11  . methocarbamol (ROBAXIN) 500 MG tablet 500 mg. Take 1-2 every 6hrs prn      . oxyCODONE (OXY IR/ROXICODONE) 5 MG immediate release tablet Take 1 tablet (5 mg total) by mouth every 4 (four) hours as needed.  120 tablet  0  . pantoprazole (PROTONIX) 40 MG tablet Take 1 tablet (40 mg total) by mouth daily.  30 tablet  11  . pravastatin (PRAVACHOL) 40 MG tablet Take 1 tablet (40 mg total) by mouth daily.  30 tablet  11  . promethazine (PHENERGAN) 12.5 MG tablet Take 1 tablet (12.5 mg total) by mouth every 8 (eight) hours as needed for nausea.   20 tablet  2  . TAZTIA XT 360 MG 24 hr capsule TAKE ONE CAPSULE BY MOUTH EVERY DAY  90 each  3  . temazepam (RESTORIL) 30 MG capsule Take 1 capsule (30 mg total) by mouth daily.  30 capsule  5     Vision, hearing, body mass index were assessed and reviewed.   During the course of the visit the patient was educated and counseled about appropriate screening and preventive services including : fall prevention , diabetes screening, nutrition counseling, colorectal cancer screening, and recommended immunizations.    Review of Systems Constitutional:  Negative for fever, chills, activity change and unexpected weight change.  HEENT:  Negative for hearing loss, ear pain, congestion, neck stiffness and postnasal drip. Negative for sore throat or swallowing problems. Negative for dental complaints.   Eyes: Negative for vision loss or change in visual acuity.  Respiratory: Negative for chest tightness and wheezing. Negative for DOE.   Cardiovascular: Negative for chest pain or palpitations. No decreased exercise tolerance Gastrointestinal: No change in bowel habit. No bloating or gas. No reflux or indigestion Genitourinary: Negative for urgency, frequency, flank pain and difficulty urinating.  Musculoskeletal: Negative for myalgias, back pain, arthralgias and gait problem.  Neurological: Negative for dizziness, tremors, weakness and headaches.  Hematological: Negative for adenopathy.  Psychiatric/Behavioral: Negative for behavioral problems and dysphoric mood.       Objective:   Physical Exam Filed Vitals:   07/07/12 1323  BP: 132/68  Pulse: 59  Temp: 96.9 F (36.1 C)  Resp: 10   Wt Readings from Last 3 Encounters:  07/07/12 170 lb (77.111 kg)  05/06/12 172 lb (78.019 kg)  12/22/11 163 lb 12 oz (74.277 kg)   Gen'l: well nourished, well developed white Woman in no distress HEENT - [place]/AT, EACs/TMs normal, oropharynx with native dentition in good condition, no buccal or palatal lesions,  posterior pharynx clear, mucous membranes moist. C&S clear, PERRLA, fundi - normal Neck - supple, no thyromegaly Nodes- negative submental, cervical, supraclavicular regions Chest - no deformity, no CVAT Lungs - clear without rales, wheezes. No increased work of breathing Breast - deferred to gyn Cardiovascular - regular rate and rhythm, quiet precordium, no murmurs, rubs or gallops, 2+ radial, DP and PT pulses Abdomen - BS+ x 4, no HSM, no guarding or rebound or tenderness Pelvic - deferred to gyn Rectal - deferred to gyn Extremities - no clubbing, cyanosis, edema or deformity.  Neuro - A&O x 3, CN II-XII normal, motor strength normal and equal, DTRs 2+ and symmetrical biceps, radial, and patellar tendons. Cerebellar - no tremor, no rigidity, fluid movement and normal gait. Derm - Head, neck, back, abdomen and extremities without suspicious lesions  Lab Results  Component Value Date   WBC 8.3 07/07/2012   HGB 14.8 07/07/2012   HCT 44.5 07/07/2012   PLT 313.0 07/07/2012   GLUCOSE 87 07/07/2012   CHOL 174 07/07/2012   TRIG 57.0 07/07/2012   HDL 46.90 07/07/2012   LDLDIRECT 195.0 08/14/2010   LDLCALC 116* 07/07/2012        ALT 17 07/07/2012   AST 19 07/07/2012        NA 141 07/07/2012   K 4.7 07/07/2012   CL 100 07/07/2012   CREATININE 0.8 07/07/2012   BUN 19 07/07/2012   CO2 33* 07/07/2012   TSH 1.02 07/07/2012   INR 0.99 06/26/2011            Assessment & Plan:

## 2012-07-10 LAB — VITAMIN D 1,25 DIHYDROXY
Vitamin D2 1, 25 (OH)2: <8 pg/mL
Vitamin D3 1, 25 (OH)2: 31 pg/mL

## 2012-07-11 NOTE — Assessment & Plan Note (Signed)
TSH is in normal range on current medication.  Plan No change in regimen

## 2012-07-11 NOTE — Assessment & Plan Note (Signed)
Interval medical history: a good year with no major illness, injury. Limited physical exam is normal. Lab results are in normal range. She is current with colorectal and breast cancer screening. Chronic medical problems are well managed. Immunizations are current and up to date.  In summary - a very nice woman who appears medically stable. She is encouraged to increase her aerobic exercise. She will return as needed or in 6 months for interim check.

## 2012-07-11 NOTE — Assessment & Plan Note (Signed)
Vitamin D level @ 31 is in normal range.  Plan Routine daily supplement with 800-1,000 iu Vitamin D3

## 2012-07-11 NOTE — Assessment & Plan Note (Signed)
S/p repair and doing well with 70-80% normal ROM  Plan Continue to do range of motion exercise

## 2012-07-11 NOTE — Assessment & Plan Note (Signed)
On medication LDL is better than goal of 130 or less. Liver functions are normal.  Plan Continue present medication and dose.

## 2012-07-11 NOTE — Assessment & Plan Note (Signed)
BP Readings from Last 3 Encounters:  07/07/12 132/68  05/06/12 164/82  12/22/11 128/82   Lab results with normal kidney function and electrolytes  Plan Continue present regimen

## 2012-07-11 NOTE — Assessment & Plan Note (Signed)
Lab Results  Component Value Date   HGB 14.8 07/07/2012   Problem resolved.

## 2012-07-14 ENCOUNTER — Other Ambulatory Visit: Payer: Self-pay | Admitting: *Deleted

## 2012-07-14 MED ORDER — ALENDRONATE SODIUM 70 MG PO TABS: 70.0000 mg | ORAL_TABLET | ORAL | Status: DC

## 2012-07-14 MED ORDER — FUROSEMIDE 40 MG PO TABS: 20.0000 mg | ORAL_TABLET | Freq: Every day | ORAL | Status: DC

## 2012-07-14 MED ORDER — PANTOPRAZOLE SODIUM 40 MG PO TBEC: 40.0000 mg | DELAYED_RELEASE_TABLET | Freq: Every day | ORAL | Status: DC

## 2012-07-14 MED ORDER — TEMAZEPAM 30 MG PO CAPS: 30.0000 mg | ORAL_CAPSULE | Freq: Every day | ORAL | Status: DC

## 2012-07-14 MED ORDER — PRAVASTATIN SODIUM 40 MG PO TABS: 40.0000 mg | ORAL_TABLET | Freq: Every day | ORAL | Status: DC

## 2012-07-14 NOTE — Telephone Encounter (Signed)
PATIENT CALLED AND REQUESTED ALL OF MEDICATION BE SENT TO [facility] IN [place]. [facility]

## 2012-08-04 ENCOUNTER — Encounter: Payer: PRIVATE HEALTH INSURANCE | Admitting: Internal Medicine

## 2012-08-06 ENCOUNTER — Other Ambulatory Visit: Payer: Self-pay | Admitting: *Deleted

## 2012-08-06 MED ORDER — ENALAPRIL MALEATE 20 MG PO TABS: 20.0000 mg | ORAL_TABLET | Freq: Every day | ORAL | Status: DC

## 2012-08-06 NOTE — Telephone Encounter (Signed)
Pt called requesting refill of Enalapril to [facility] in [place]. Rx sent, called to inform pt, pt had already left for the day.

## 2012-10-11 ENCOUNTER — Ambulatory Visit: Payer: Self-pay | Admitting: Obstetrics and Gynecology

## 2012-11-11 ENCOUNTER — Encounter: Payer: Self-pay | Admitting: Internal Medicine

## 2012-11-11 ENCOUNTER — Ambulatory Visit (INDEPENDENT_AMBULATORY_CARE_PROVIDER_SITE_OTHER): Payer: PRIVATE HEALTH INSURANCE | Admitting: Internal Medicine

## 2012-11-11 VITALS — BP 168/88 | HR 61 | Temp 97.8°F | Wt 178.0 lb

## 2012-11-11 DIAGNOSIS — I10 Essential (primary) hypertension: Secondary | ICD-10-CM

## 2012-11-11 DIAGNOSIS — M5412 Radiculopathy, cervical region: Secondary | ICD-10-CM

## 2012-11-11 MED ORDER — TIZANIDINE HCL 4 MG PO TABS: 4.0000 mg | ORAL_TABLET | Freq: Four times a day (QID) | ORAL | Status: DC | PRN

## 2012-11-11 NOTE — Progress Notes (Signed)
Subjective:    Patient ID: Michelle [name], female    DOB: 1946/01/30, 67 y.o.   MRN: [id number]  HPI  Here with acute onset mod to severe sharp and burning left neck pain with radiation to the distal LUE for 1 mo, assoc with numbness and left grip mild weakness, but not dropping objects.  No bowel or bladder change, fever, wt loss,  worsening LE pain/numbness/weakness, gait change or falls.  Worse to lie flat or on right side.  Has had left shoulder pain achy since 2007 s/p left rotater cuff surgury, but this is different pain, has to sleep in recliner last night due to pain.  Pt denies chest pain, increased sob or doe, wheezing, orthopnea, PND, increased LE swelling, palpitations, dizziness or syncope.   Pt denies polydipsia, polyuria, Past Medical History  Diagnosis Date  . Hypertension     takes Enalapril  . Cancer     Melanoma on back- Stage 1  . Pneumonia   . GERD (gastroesophageal reflux disease)     indegestion  . Arthritis     R knee and Left  foot  . Endometrial hyperplasia, unspecified   . Disorders of bursae and tendons in shoulder region, unspecified   . Unspecified vitamin D deficiency   . Other malaise and fatigue   . Unspecified hypothyroidism   . Melanoma of skin, site unspecified   . Family history of malignant neoplasm of gastrointestinal tract   . Other and unspecified hyperlipidemia    Past Surgical History  Procedure Laterality Date  . Knee arthroscopy  2012  . Tumor removal  2009    From uterus  . Rotator cuff repair  2010    Left  . Total knee arthroplasty  07/07/2011    Procedure: TOTAL KNEE ARTHROPLASTY;  Surgeon: Raymon Mutton, MD;  Location: [facility] OR;  Service: Orthopedics;  Laterality: Right;  . Tubal ligation    . Melanoma excision  2006    near scapula  . Tumor excision  2008    endometrial    reports that she has never smoked. She does not have any smokeless tobacco history on file. She reports that she does not drink alcohol or use illicit  drugs. family history includes Arthritis in her sister; Coronary artery disease in her other; Drug abuse in her sister; Emphysema in her father; Heart attack in her paternal grandmother and paternal uncle; Heart disease in her sister; Heart failure in her father; Hyperlipidemia in her brother; Hypertension in her brother, mother, and sister; Other in her mother; and Thyroid disease in her sister.  There is no history of Breast cancer and Ovarian cancer. No Known Allergies Current Outpatient Prescriptions on File Prior to Visit  Medication Sig Dispense Refill  . alendronate (FOSAMAX) 70 MG tablet Take 1 tablet (70 mg total) by mouth every 7 (seven) days. Take with a full glass of water on an empty stomach. Takes on Sunday.  5 tablet  11  . Calcium Citrate-Vitamin D (CALCIUM CITRATE + D PO) Take 1 tablet by mouth daily.        . Cholecalciferol (VITAMIN D3) 5000 UNITS CAPS Take 1 capsule by mouth daily.  30 capsule  11  . enalapril (VASOTEC) 20 MG tablet Take 1 tablet (20 mg total) by mouth daily.  30 tablet  11  . furosemide (LASIX) 40 MG tablet Take 0.5 tablets (20 mg total) by mouth daily.  30 tablet  11  . HYDROcodone-acetaminophen (NORCO) 5-325 MG per tablet  Take 1 tablet by mouth every 6 (six) hours as needed for pain.  60 tablet  5  . pantoprazole (PROTONIX) 40 MG tablet Take 1 tablet (40 mg total) by mouth daily.  30 tablet  11  . pravastatin (PRAVACHOL) 40 MG tablet Take 1 tablet (40 mg total) by mouth daily.  30 tablet  11  . TAZTIA XT 360 MG 24 hr capsule TAKE ONE CAPSULE BY MOUTH EVERY DAY  90 each  3  . temazepam (RESTORIL) 30 MG capsule Take 1 capsule (30 mg total) by mouth daily.  30 capsule  5   No current facility-administered medications on file prior to visit.   Review of Systems  Constitutional: Negative for unexpected weight change, or unusual diaphoresis  HENT: Negative for tinnitus.   Eyes: Negative for photophobia and visual disturbance.  Respiratory: Negative for choking  and stridor.   Gastrointestinal: Negative for vomiting and blood in stool.  Genitourinary: Negative for hematuria and decreased urine volume.  Musculoskeletal: Negative for acute joint swelling Skin: Negative for color change and wound.  Neurological: Negative for tremors and numbness other than noted  Psychiatric/Behavioral: Negative for decreased concentration or  hyperactivity.       Objective:   Physical Exam BP 168/88  Pulse 61  Temp(Src) 97.8 F (36.6 C) (Oral)  Wt 178 lb (80.74 kg)  BMI 35.93 kg/m2  SpO2 98% VS noted, non toxic but fatigued Constitutional: Pt appears well-developed and well-nourished.  HENT: Head: NCAT.  Right Ear: External ear normal.  Left Ear: External ear normal.  Eyes: Conjunctivae and EOM are normal. Pupils are equal, round, and reactive to light.  Neck: Normal range of motion. Neck supple. No LA or cervical tender except for left lower paravertebral and trapezoid Cardiovascular: Normal rate and regular rhythm.   Pulmonary/Chest: Effort normal and breath sounds normal.  Neurological: Pt is alert. Not confused , cn 2-12 intact, motor 4+/5 distal LUE o/w intact, sens/dtr intact Skin: Skin is warm. No erythema. No rash Psychiatric: Pt behavior is normal. Thought content normal.     Assessment & Plan:

## 2012-11-11 NOTE — Patient Instructions (Addendum)
Please take all new medication as prescribed - the muscle relaxer Please continue all other medications as before, including the pain medication you have at home You will be contacted regarding the referral for: MRI for the neck, and Dr Nudelman/NS asap You will be contacted by phone if any other changes need to be made immediately.  Otherwise, you will receive a letter about your results with an explanation, but please check with MyChart first. Thank you for enrolling in MyChart. Please follow the instructions below to securely access your online medical record. MyChart allows you to send messages to your doctor, view your test results, renew your prescriptions, schedule appointments, and more.

## 2012-11-13 NOTE — Assessment & Plan Note (Signed)
Moderate pain now worse for 1 mo, now with neuro change, cant r/o disc dz/nerve impairment, for MRI c-spine, and NS referral - Dr Newell Coral per pt request

## 2012-11-13 NOTE — Assessment & Plan Note (Signed)
Elevated today likely situational, o/w stable overall by history and exam, recent data reviewed with pt, and pt to continue medical treatment as before,  to f/u any worsening symptoms or concerns BP Readings from Last 3 Encounters:  11/11/12 168/88  07/07/12 132/68  05/06/12 164/82

## 2012-11-18 ENCOUNTER — Ambulatory Visit
Admission: RE | Admit: 2012-11-18 | Discharge: 2012-11-18 | Disposition: A | Discharge status: Home / Self Care | Payer: PRIVATE HEALTH INSURANCE | Source: Ambulatory Visit | Attending: Internal Medicine | Admitting: Internal Medicine

## 2012-11-18 DIAGNOSIS — M5412 Radiculopathy, cervical region: Secondary | ICD-10-CM

## 2012-11-28 HISTORY — PX: SPINE SURGERY: SHX786

## 2012-12-01 ENCOUNTER — Other Ambulatory Visit: Payer: Self-pay | Admitting: Neurosurgery

## 2012-12-06 ENCOUNTER — Encounter (HOSPITAL_COMMUNITY): Payer: Self-pay

## 2012-12-06 ENCOUNTER — Encounter (HOSPITAL_COMMUNITY)
Admission: RE | Admit: 2012-12-06 | Discharge: 2012-12-06 | Disposition: A | Discharge status: Home / Self Care | Payer: PRIVATE HEALTH INSURANCE | Source: Ambulatory Visit | Attending: Neurosurgery | Admitting: Neurosurgery

## 2012-12-06 ENCOUNTER — Ambulatory Visit (HOSPITAL_COMMUNITY)
Admission: RE | Admit: 2012-12-06 | Discharge: 2012-12-06 | Disposition: A | Discharge status: Home / Self Care | Payer: PRIVATE HEALTH INSURANCE | Source: Ambulatory Visit | Attending: Anesthesiology | Admitting: Anesthesiology

## 2012-12-06 DIAGNOSIS — Z01810 Encounter for preprocedural cardiovascular examination: Secondary | ICD-10-CM | POA: Insufficient documentation

## 2012-12-06 DIAGNOSIS — E785 Hyperlipidemia, unspecified: Secondary | ICD-10-CM | POA: Insufficient documentation

## 2012-12-06 DIAGNOSIS — K219 Gastro-esophageal reflux disease without esophagitis: Secondary | ICD-10-CM | POA: Insufficient documentation

## 2012-12-06 DIAGNOSIS — Z01812 Encounter for preprocedural laboratory examination: Secondary | ICD-10-CM | POA: Insufficient documentation

## 2012-12-06 DIAGNOSIS — E039 Hypothyroidism, unspecified: Secondary | ICD-10-CM | POA: Insufficient documentation

## 2012-12-06 DIAGNOSIS — E669 Obesity, unspecified: Secondary | ICD-10-CM | POA: Insufficient documentation

## 2012-12-06 DIAGNOSIS — Z01818 Encounter for other preprocedural examination: Secondary | ICD-10-CM | POA: Insufficient documentation

## 2012-12-06 DIAGNOSIS — I10 Essential (primary) hypertension: Secondary | ICD-10-CM | POA: Insufficient documentation

## 2012-12-06 HISTORY — DX: Nocturia: R35.1

## 2012-12-06 HISTORY — DX: Other muscle spasm: M62.838

## 2012-12-06 HISTORY — DX: Localized edema: R60.0

## 2012-12-06 HISTORY — DX: Hyperlipidemia, unspecified: E78.5

## 2012-12-06 HISTORY — DX: Age-related osteoporosis without current pathological fracture: M81.0

## 2012-12-06 HISTORY — DX: Personal history of other diseases of the respiratory system: Z87.09

## 2012-12-06 HISTORY — DX: Insomnia, unspecified: G47.00

## 2012-12-06 HISTORY — DX: Weakness: R53.1

## 2012-12-06 HISTORY — DX: Edema, unspecified: R60.9

## 2012-12-06 LAB — SURGICAL PCR SCREEN
MRSA, PCR: NEGATIVE
Staphylococcus aureus: NEGATIVE

## 2012-12-06 LAB — CBC
Platelets: 321 10*3/uL (ref 150–400)
RBC: 4.75 MIL/uL (ref 3.87–5.11)
RDW: 13.9 % (ref 11.5–15.5)
WBC: 8.4 10*3/uL (ref 4.0–10.5)

## 2012-12-06 LAB — BASIC METABOLIC PANEL
Calcium: 9.5 mg/dL (ref 8.4–10.5)
Creatinine, Ser: 0.85 mg/dL (ref 0.50–1.10)
GFR calc Af Amer: 79 mL/min — ABNORMAL LOW (ref >90)
Sodium: 142 mEq/L (ref 135–145)

## 2012-12-06 NOTE — Pre-Procedure Instructions (Signed)
EZELLE SURPRENANT  12/06/2012   Your procedure is scheduled on:  Wed, June 11 @ 8:30 AM  Report to [facility] at 6:30 AM.  Call this number if you have problems the morning of surgery: [phone]   Remember:   Do not eat food or drink liquids after midnight.   Take these medicines the morning of surgery with A SIP OF WATER: Pain Pill(if needed),Pantoprazole(Protonix),and Temazepam(Restoril)   Do not wear jewelry, make-up or nail polish.  Do not wear lotions, powders, or perfumes. You may wear deodorant.  Do not shave 48 hours prior to surgery.   Do not bring valuables to the hospital.  [facility] is not responsible                   for any belongings or valuables.  Contacts, dentures or bridgework may not be worn into surgery.  Leave suitcase in the car. After surgery it may be brought to your room.  For patients admitted to the hospital, checkout time is 11:00 AM the day of  discharge.   Patients discharged the day of surgery will not be allowed to drive  home.    Special Instructions: Shower using CHG 2 nights before surgery and the night before surgery.  If you shower the day of surgery use CHG.  Use special wash - you have one bottle of CHG for all showers.  You should use approximately 1/3 of the bottle for each shower.   Please read over the following fact sheets that you were given: Pain Booklet, Coughing and Deep Breathing, MRSA Information and Surgical Site Infection Prevention

## 2012-12-06 NOTE — Progress Notes (Signed)
Pt doesn't have a cardiologist  Stress test/echo done in 2009  Denies ever having a heart cath  Medical Md is Dr.Norins  Denies EKG or CXR in past yr

## 2012-12-07 MED ORDER — CEFAZOLIN SODIUM-DEXTROSE 2-3 GM-% IV SOLR
2.0000 g | INTRAVENOUS | Status: AC
  Administered 2012-12-08: 2 g via INTRAVENOUS
  Filled 2012-12-07: qty 50

## 2012-12-07 NOTE — Progress Notes (Signed)
Anesthesia Chart Review:  Patient is a 67 year old female scheduled for left C7-T1 posterior cervicothoracic laminotomy/foraminotomy on 12/08/12 by Dr. Newell Coral.  History includes hypothyroidism, HTN, HLD, GERD, arthritis, peripheral edema, melanoma, obesity, non-smoker, right TKA 07/2011.  PCP is Dr. Debby Bud. BP was elevated at 170/100 at PAT.  Unfortunately, it was not rechecked.  Her last BP readings at her PCP office were 168/88, 132/68, 164/82, 128/82.     Preoperative labs noted.  CXR on 12/06/12 showed no acute abnormality.  Carotid duplex on 01/20/11 showed 0-39% bilateral ICA stenosis.  EKG on 12/06/12 showed NSR, cannot rule out anterior infarct (age undetermined).  Her EKG appears stable since 06/26/11.    If no acute changes and BP is reasonable on the day of surgery then I would anticipate that she could proceed as planned.  [facility]na [facility]/Anesthesiology Phone [phone] 12/07/2012 9:50 AM

## 2012-12-08 ENCOUNTER — Encounter (HOSPITAL_COMMUNITY)
Admission: RE | Disposition: A | Discharge status: Home / Self Care | Payer: Self-pay | Source: Ambulatory Visit | Attending: Neurosurgery

## 2012-12-08 ENCOUNTER — Inpatient Hospital Stay (HOSPITAL_COMMUNITY): Payer: PRIVATE HEALTH INSURANCE

## 2012-12-08 ENCOUNTER — Encounter (HOSPITAL_COMMUNITY): Payer: Self-pay | Admitting: Pharmacy Technician

## 2012-12-08 ENCOUNTER — Encounter (HOSPITAL_COMMUNITY): Payer: Self-pay | Admitting: Critical Care Medicine

## 2012-12-08 ENCOUNTER — Encounter (HOSPITAL_COMMUNITY): Payer: Self-pay | Admitting: Vascular Surgery

## 2012-12-08 ENCOUNTER — Inpatient Hospital Stay (HOSPITAL_COMMUNITY): Payer: PRIVATE HEALTH INSURANCE | Admitting: Anesthesiology

## 2012-12-08 ENCOUNTER — Observation Stay (HOSPITAL_COMMUNITY)
Admission: RE | Admit: 2012-12-08 | Discharge: 2012-12-09 | Disposition: A | Discharge status: Home / Self Care | Payer: PRIVATE HEALTH INSURANCE | Source: Ambulatory Visit | Attending: Neurosurgery | Admitting: Neurosurgery

## 2012-12-08 DIAGNOSIS — M47812 Spondylosis without myelopathy or radiculopathy, cervical region: Principal | ICD-10-CM | POA: Insufficient documentation

## 2012-12-08 DIAGNOSIS — M5030 Other cervical disc degeneration, unspecified cervical region: Secondary | ICD-10-CM | POA: Insufficient documentation

## 2012-12-08 DIAGNOSIS — M7130 Other bursal cyst, unspecified site: Secondary | ICD-10-CM | POA: Insufficient documentation

## 2012-12-08 DIAGNOSIS — I10 Essential (primary) hypertension: Secondary | ICD-10-CM | POA: Insufficient documentation

## 2012-12-08 DIAGNOSIS — Z79899 Other long term (current) drug therapy: Secondary | ICD-10-CM | POA: Insufficient documentation

## 2012-12-08 HISTORY — PX: POSTERIOR CERVICAL FUSION/FORAMINOTOMY: SHX5038

## 2012-12-08 SURGERY — POSTERIOR CERVICAL FUSION/FORAMINOTOMY LEVEL 1
Anesthesia: General | Site: Neck | Laterality: Left | Wound class: Clean

## 2012-12-08 MED ORDER — MAGNESIUM HYDROXIDE 400 MG/5ML PO SUSP: 30.0000 mL | Freq: Every day | ORAL | Status: DC | PRN

## 2012-12-08 MED ORDER — CYCLOBENZAPRINE HCL 10 MG PO TABS
ORAL_TABLET | ORAL | Status: AC
  Filled 2012-12-08: qty 1

## 2012-12-08 MED ORDER — PHENYLEPHRINE HCL 10 MG/ML IJ SOLN
10.0000 mg | INTRAVENOUS | Status: DC | PRN
  Administered 2012-12-08: 25 ug/min via INTRAVENOUS

## 2012-12-08 MED ORDER — MIDAZOLAM HCL 2 MG/2ML IJ SOLN: 0.5000 mg | Freq: Once | INTRAMUSCULAR | Status: DC | PRN

## 2012-12-08 MED ORDER — ZOLPIDEM TARTRATE 5 MG PO TABS: 5.0000 mg | ORAL_TABLET | Freq: Every evening | ORAL | Status: DC | PRN

## 2012-12-08 MED ORDER — THROMBIN 5000 UNITS EX SOLR
OROMUCOSAL | Status: DC | PRN
  Administered 2012-12-08: 10:00:00 via TOPICAL

## 2012-12-08 MED ORDER — FENTANYL CITRATE 0.05 MG/ML IJ SOLN
INTRAMUSCULAR | Status: DC | PRN
  Administered 2012-12-08 (×2): 50 ug via INTRAVENOUS
  Administered 2012-12-08: 200 ug via INTRAVENOUS

## 2012-12-08 MED ORDER — LIDOCAINE HCL (CARDIAC) 20 MG/ML IV SOLN
INTRAVENOUS | Status: DC | PRN
  Administered 2012-12-08: 30 mg via INTRAVENOUS

## 2012-12-08 MED ORDER — MENTHOL 3 MG MT LOZG: 1.0000 | LOZENGE | OROMUCOSAL | Status: DC | PRN

## 2012-12-08 MED ORDER — 0.9 % SODIUM CHLORIDE (POUR BTL) OPTIME
TOPICAL | Status: DC | PRN
  Administered 2012-12-08: 1000 mL

## 2012-12-08 MED ORDER — SODIUM CHLORIDE 0.9 % IJ SOLN: 3.0000 mL | INTRAMUSCULAR | Status: DC | PRN

## 2012-12-08 MED ORDER — BACITRACIN ZINC 500 UNIT/GM EX OINT
TOPICAL_OINTMENT | CUTANEOUS | Status: DC | PRN
  Administered 2012-12-08: 1 via TOPICAL

## 2012-12-08 MED ORDER — HYDROMORPHONE HCL PF 1 MG/ML IJ SOLN
0.2500 mg | INTRAMUSCULAR | Status: DC | PRN
  Administered 2012-12-08 (×4): 0.5 mg via INTRAVENOUS

## 2012-12-08 MED ORDER — OXYCODONE HCL 5 MG PO TABS: 5.0000 mg | ORAL_TABLET | Freq: Once | ORAL | Status: DC | PRN

## 2012-12-08 MED ORDER — SODIUM CHLORIDE 0.9 % IV SOLN
INTRAVENOUS | Status: AC
  Filled 2012-12-08: qty 500

## 2012-12-08 MED ORDER — CYCLOBENZAPRINE HCL 10 MG PO TABS
10.0000 mg | ORAL_TABLET | Freq: Three times a day (TID) | ORAL | Status: DC | PRN
  Administered 2012-12-08 – 2012-12-09 (×3): 10 mg via ORAL
  Filled 2012-12-08 (×2): qty 1

## 2012-12-08 MED ORDER — KETOROLAC TROMETHAMINE 30 MG/ML IJ SOLN
INTRAMUSCULAR | Status: AC
  Filled 2012-12-08: qty 1

## 2012-12-08 MED ORDER — OXYCODONE HCL 5 MG/5ML PO SOLN: 5.0000 mg | Freq: Once | ORAL | Status: DC | PRN

## 2012-12-08 MED ORDER — THROMBIN 20000 UNITS EX SOLR
CUTANEOUS | Status: DC | PRN
  Administered 2012-12-08: 10:00:00 via TOPICAL

## 2012-12-08 MED ORDER — HYDROXYZINE HCL 25 MG PO TABS: 50.0000 mg | ORAL_TABLET | ORAL | Status: DC | PRN

## 2012-12-08 MED ORDER — ACETAMINOPHEN 10 MG/ML IV SOLN
INTRAVENOUS | Status: DC | PRN
  Administered 2012-12-08: 1000 mg via INTRAVENOUS

## 2012-12-08 MED ORDER — PROPOFOL 10 MG/ML IV BOLUS
INTRAVENOUS | Status: DC | PRN
  Administered 2012-12-08: 150 mg via INTRAVENOUS

## 2012-12-08 MED ORDER — GLYCOPYRROLATE 0.2 MG/ML IJ SOLN
INTRAMUSCULAR | Status: DC | PRN
  Administered 2012-12-08: 0.6 mg via INTRAVENOUS

## 2012-12-08 MED ORDER — KETOROLAC TROMETHAMINE 30 MG/ML IJ SOLN
30.0000 mg | Freq: Once | INTRAMUSCULAR | Status: AC
  Administered 2012-12-08: 30 mg via INTRAVENOUS

## 2012-12-08 MED ORDER — HYDROMORPHONE HCL PF 1 MG/ML IJ SOLN
INTRAMUSCULAR | Status: AC
  Filled 2012-12-08: qty 1

## 2012-12-08 MED ORDER — MORPHINE SULFATE 4 MG/ML IJ SOLN
4.0000 mg | INTRAMUSCULAR | Status: DC | PRN
  Administered 2012-12-08: 4 mg via INTRAMUSCULAR
  Filled 2012-12-08: qty 1

## 2012-12-08 MED ORDER — LACTATED RINGERS IV SOLN
INTRAVENOUS | Status: DC | PRN
  Administered 2012-12-08 (×2): via INTRAVENOUS

## 2012-12-08 MED ORDER — BISACODYL 10 MG RE SUPP: 10.0000 mg | Freq: Every day | RECTAL | Status: DC | PRN

## 2012-12-08 MED ORDER — NEOSTIGMINE METHYLSULFATE 1 MG/ML IJ SOLN
INTRAMUSCULAR | Status: DC | PRN
  Administered 2012-12-08: 4 mg via INTRAVENOUS

## 2012-12-08 MED ORDER — FUROSEMIDE 20 MG PO TABS
20.0000 mg | ORAL_TABLET | Freq: Every day | ORAL | Status: DC
Start: 2012-12-08 — End: 2012-12-09
  Administered 2012-12-08 – 2012-12-09 (×2): 20 mg via ORAL
  Filled 2012-12-08 (×3): qty 1

## 2012-12-08 MED ORDER — TIZANIDINE HCL 4 MG PO TABS
4.0000 mg | ORAL_TABLET | Freq: Two times a day (BID) | ORAL | Status: DC | PRN
  Filled 2012-12-08: qty 1

## 2012-12-08 MED ORDER — ALUM & MAG HYDROXIDE-SIMETH 200-200-20 MG/5ML PO SUSP: 30.0000 mL | Freq: Four times a day (QID) | ORAL | Status: DC | PRN

## 2012-12-08 MED ORDER — ONDANSETRON HCL 4 MG/2ML IJ SOLN
INTRAMUSCULAR | Status: DC | PRN
  Administered 2012-12-08: 4 mg via INTRAVENOUS

## 2012-12-08 MED ORDER — OXYCODONE HCL 5 MG PO TABS
ORAL_TABLET | ORAL | Status: AC
  Filled 2012-12-08: qty 2

## 2012-12-08 MED ORDER — HYDROCODONE-ACETAMINOPHEN 5-325 MG PO TABS: 1.0000 | ORAL_TABLET | Freq: Three times a day (TID) | ORAL | Status: DC | PRN

## 2012-12-08 MED ORDER — BACITRACIN 50000 UNITS IM SOLR
INTRAMUSCULAR | Status: AC
  Filled 2012-12-08: qty 1

## 2012-12-08 MED ORDER — KCL IN DEXTROSE-NACL 10-5-0.45 MEQ/L-%-% IV SOLN
INTRAVENOUS | Status: DC
  Filled 2012-12-08 (×4): qty 1000

## 2012-12-08 MED ORDER — OXYCODONE-ACETAMINOPHEN 5-325 MG PO TABS: 1.0000 | ORAL_TABLET | Freq: Every evening | ORAL | Status: DC | PRN

## 2012-12-08 MED ORDER — KETOROLAC TROMETHAMINE 30 MG/ML IJ SOLN
30.0000 mg | Freq: Four times a day (QID) | INTRAMUSCULAR | Status: DC
Start: 2012-12-08 — End: 2012-12-09
  Administered 2012-12-08 – 2012-12-09 (×3): 30 mg via INTRAVENOUS
  Filled 2012-12-08 (×7): qty 1

## 2012-12-08 MED ORDER — ROCURONIUM BROMIDE 100 MG/10ML IV SOLN
INTRAVENOUS | Status: DC | PRN
  Administered 2012-12-08: 50 mg via INTRAVENOUS
  Administered 2012-12-08: 5 mg via INTRAVENOUS
  Administered 2012-12-08: 10 mg via INTRAVENOUS

## 2012-12-08 MED ORDER — OXYCODONE HCL 5 MG PO TABS
5.0000 mg | ORAL_TABLET | ORAL | Status: DC | PRN
  Administered 2012-12-08 – 2012-12-09 (×4): 10 mg via ORAL
  Filled 2012-12-08 (×3): qty 2

## 2012-12-08 MED ORDER — ONDANSETRON HCL 4 MG/2ML IJ SOLN: 4.0000 mg | Freq: Four times a day (QID) | INTRAMUSCULAR | Status: DC | PRN

## 2012-12-08 MED ORDER — SODIUM CHLORIDE 0.9 % IR SOLN
Status: DC | PRN
  Administered 2012-12-08: 10:00:00

## 2012-12-08 MED ORDER — MEPERIDINE HCL 25 MG/ML IJ SOLN: 6.2500 mg | INTRAMUSCULAR | Status: DC | PRN

## 2012-12-08 MED ORDER — ACETAMINOPHEN 650 MG RE SUPP: 650.0000 mg | RECTAL | Status: DC | PRN

## 2012-12-08 MED ORDER — PANTOPRAZOLE SODIUM 40 MG PO TBEC
40.0000 mg | DELAYED_RELEASE_TABLET | Freq: Every day | ORAL | Status: DC
  Administered 2012-12-09: 40 mg via ORAL
  Filled 2012-12-08: qty 1

## 2012-12-08 MED ORDER — ACETAMINOPHEN 10 MG/ML IV SOLN
INTRAVENOUS | Status: AC
  Filled 2012-12-08: qty 100

## 2012-12-08 MED ORDER — ARTIFICIAL TEARS OP OINT
TOPICAL_OINTMENT | OPHTHALMIC | Status: DC | PRN
  Administered 2012-12-08: 1 via OPHTHALMIC

## 2012-12-08 MED ORDER — PROMETHAZINE HCL 25 MG/ML IJ SOLN: 6.2500 mg | INTRAMUSCULAR | Status: DC | PRN

## 2012-12-08 MED ORDER — PHENOL 1.4 % MT LIQD: 1.0000 | OROMUCOSAL | Status: DC | PRN

## 2012-12-08 MED ORDER — LIDOCAINE-EPINEPHRINE 1 %-1:100000 IJ SOLN
INTRAMUSCULAR | Status: DC | PRN
  Administered 2012-12-08: 10 mL

## 2012-12-08 MED ORDER — ACETAMINOPHEN 325 MG PO TABS: 650.0000 mg | ORAL_TABLET | ORAL | Status: DC | PRN

## 2012-12-08 MED ORDER — BUPIVACAINE HCL (PF) 0.5 % IJ SOLN
INTRAMUSCULAR | Status: DC | PRN
  Administered 2012-12-08: 10 mL

## 2012-12-08 MED ORDER — PHENYLEPHRINE HCL 10 MG/ML IJ SOLN
INTRAMUSCULAR | Status: DC | PRN
  Administered 2012-12-08 (×3): 80 ug via INTRAVENOUS

## 2012-12-08 MED ORDER — SODIUM CHLORIDE 0.9 % IJ SOLN
3.0000 mL | Freq: Two times a day (BID) | INTRAMUSCULAR | Status: DC
  Administered 2012-12-08 (×2): 3 mL via INTRAVENOUS

## 2012-12-08 MED ORDER — ENALAPRIL MALEATE 20 MG PO TABS
20.0000 mg | ORAL_TABLET | Freq: Every day | ORAL | Status: DC
  Administered 2012-12-08 – 2012-12-09 (×2): 20 mg via ORAL
  Filled 2012-12-08 (×2): qty 1

## 2012-12-08 MED ORDER — ACETAMINOPHEN 10 MG/ML IV SOLN
1000.0000 mg | Freq: Four times a day (QID) | INTRAVENOUS | Status: DC
  Administered 2012-12-08 – 2012-12-09 (×3): 1000 mg via INTRAVENOUS
  Filled 2012-12-08 (×6): qty 100

## 2012-12-08 MED ORDER — DEXAMETHASONE SODIUM PHOSPHATE 4 MG/ML IJ SOLN
INTRAMUSCULAR | Status: DC | PRN
  Administered 2012-12-08: 4 mg via INTRAVENOUS

## 2012-12-08 MED ORDER — MIDAZOLAM HCL 5 MG/5ML IJ SOLN
INTRAMUSCULAR | Status: DC | PRN
  Administered 2012-12-08: 1 mg via INTRAVENOUS

## 2012-12-08 MED ORDER — SIMVASTATIN 20 MG PO TABS
20.0000 mg | ORAL_TABLET | Freq: Every day | ORAL | Status: DC
  Administered 2012-12-08: 20 mg via ORAL
  Filled 2012-12-08 (×2): qty 1

## 2012-12-08 MED ORDER — TEMAZEPAM 30 MG PO CAPS: 30.0000 mg | ORAL_CAPSULE | Freq: Every evening | ORAL | Status: DC | PRN

## 2012-12-08 SURGICAL SUPPLY — 75 items
ADH SKN CLS APL DERMABOND .7 (GAUZE/BANDAGES/DRESSINGS)
ADH SKN CLS LQ APL DERMABOND (GAUZE/BANDAGES/DRESSINGS) ×2
BAG DECANTER FOR FLEXI CONT (MISCELLANEOUS) ×2 IMPLANT
BIT DRILL NEURO 2X3.1 SFT TUCH (MISCELLANEOUS) ×1 IMPLANT
BIT DRILL WIRE PASS 1.3MM (BIT) IMPLANT
BLADE SURG 11 STRL SS (BLADE) ×1 IMPLANT
BLADE SURG ROTATE 9660 (MISCELLANEOUS) ×1 IMPLANT
BRUSH SCRUB EZ PLAIN DRY (MISCELLANEOUS) ×2 IMPLANT
CANISTER SUCTION 2500CC (MISCELLANEOUS) ×2 IMPLANT
CLOTH BEACON ORANGE TIMEOUT ST (SAFETY) ×2 IMPLANT
CONT SPEC 4OZ CLIKSEAL STRL BL (MISCELLANEOUS) ×2 IMPLANT
COVER TABLE BACK 60X90 (DRAPES) ×1 IMPLANT
DECANTER SPIKE VIAL GLASS SM (MISCELLANEOUS) ×2 IMPLANT
DERMABOND ADHESIVE PROPEN (GAUZE/BANDAGES/DRESSINGS) ×2
DERMABOND ADVANCED (GAUZE/BANDAGES/DRESSINGS)
DERMABOND ADVANCED .7 DNX12 (GAUZE/BANDAGES/DRESSINGS) ×1 IMPLANT
DERMABOND ADVANCED .7 DNX6 (GAUZE/BANDAGES/DRESSINGS) IMPLANT
DRAPE C-ARM 42X72 X-RAY (DRAPES) ×4 IMPLANT
DRAPE C-ARMOR (DRAPES) ×1 IMPLANT
DRAPE LAPAROTOMY 100X72 PEDS (DRAPES) ×2 IMPLANT
DRAPE MICROSCOPE LEICA (MISCELLANEOUS) ×1 IMPLANT
DRAPE POUCH INSTRU U-SHP 10X18 (DRAPES) ×2 IMPLANT
DRAPE PROXIMA HALF (DRAPES) IMPLANT
DRILL NEURO 2X3.1 SOFT TOUCH (MISCELLANEOUS) ×2
DRILL WIRE PASS 1.3MM (BIT)
DRSG EMULSION OIL 3X3 NADH (GAUZE/BANDAGES/DRESSINGS) IMPLANT
ELECT REM PT RETURN 9FT ADLT (ELECTROSURGICAL) ×2
ELECTRODE REM PT RTRN 9FT ADLT (ELECTROSURGICAL) ×1 IMPLANT
EVACUATOR 1/8 PVC DRAIN (DRAIN) IMPLANT
GAUZE SPONGE 4X4 16PLY XRAY LF (GAUZE/BANDAGES/DRESSINGS) ×2 IMPLANT
GLOVE BIO SURGEON STRL SZ8 (GLOVE) ×1 IMPLANT
GLOVE BIOGEL PI IND STRL 7.0 (GLOVE) ×1 IMPLANT
GLOVE BIOGEL PI IND STRL 8 (GLOVE) ×1 IMPLANT
GLOVE BIOGEL PI INDICATOR 7.0 (GLOVE) ×1
GLOVE BIOGEL PI INDICATOR 8 (GLOVE) ×1
GLOVE ECLIPSE 7.5 STRL STRAW (GLOVE) ×2 IMPLANT
GLOVE EXAM NITRILE LRG STRL (GLOVE) IMPLANT
GLOVE EXAM NITRILE MD LF STRL (GLOVE) IMPLANT
GLOVE EXAM NITRILE XL STR (GLOVE) IMPLANT
GLOVE EXAM NITRILE XS STR PU (GLOVE) IMPLANT
GLOVE SURG SS PI 7.0 STRL IVOR (GLOVE) ×3 IMPLANT
GOWN BRE IMP SLV AUR LG STRL (GOWN DISPOSABLE) IMPLANT
GOWN BRE IMP SLV AUR XL STRL (GOWN DISPOSABLE) ×4 IMPLANT
GOWN STRL REIN 2XL LVL4 (GOWN DISPOSABLE) IMPLANT
HEMOSTAT SURGICEL 2X14 (HEMOSTASIS) IMPLANT
KIT BASIN OR (CUSTOM PROCEDURE TRAY) ×2 IMPLANT
KIT INFUSE XX SMALL 0.7CC (Orthopedic Implant) ×1 IMPLANT
KIT ROOM TURNOVER OR (KITS) ×2 IMPLANT
NDL SPNL 18GX3.5 QUINCKE PK (NEEDLE) ×1 IMPLANT
NEEDLE SPNL 18GX3.5 QUINCKE PK (NEEDLE) ×2 IMPLANT
NEEDLE SPNL 22GX3.5 QUINCKE BK (NEEDLE) ×2 IMPLANT
NS IRRIG 1000ML POUR BTL (IV SOLUTION) ×2 IMPLANT
PACK FOAM VITOSS 5CC (Orthopedic Implant) ×1 IMPLANT
PACK LAMINECTOMY NEURO (CUSTOM PROCEDURE TRAY) ×2 IMPLANT
PAD ARMBOARD 7.5X6 YLW CONV (MISCELLANEOUS) ×6 IMPLANT
PIN MAYFIELD SKULL DISP (PIN) ×2 IMPLANT
ROD VUEPOINT 3.5X60 (Rod) ×1 IMPLANT
RUBBERBAND STERILE (MISCELLANEOUS) ×2 IMPLANT
SCREW MA MM 3.5X12 (Screw) ×2 IMPLANT
SCREW SET THREADED (Screw) ×5 IMPLANT
SCREW VUEPOINT 3.5X20MM (Screw) ×2 IMPLANT
SPONGE GAUZE 4X4 12PLY (GAUZE/BANDAGES/DRESSINGS) IMPLANT
SPONGE LAP 4X18 X RAY DECT (DISPOSABLE) IMPLANT
SPONGE SURGIFOAM ABS GEL 100 (HEMOSTASIS) ×2 IMPLANT
STAPLER SKIN PROX WIDE 3.9 (STAPLE) ×2 IMPLANT
SUT ETHILON 3 0 FSL (SUTURE) IMPLANT
SUT VIC AB 0 CT1 18XCR BRD8 (SUTURE) ×1 IMPLANT
SUT VIC AB 0 CT1 8-18 (SUTURE) ×4
SUT VIC AB 2-0 CP2 18 (SUTURE) ×4 IMPLANT
SYR 20ML ECCENTRIC (SYRINGE) ×2 IMPLANT
TOWEL OR 17X24 6PK STRL BLUE (TOWEL DISPOSABLE) ×2 IMPLANT
TOWEL OR 17X26 10 PK STRL BLUE (TOWEL DISPOSABLE) ×2 IMPLANT
TRAY FOLEY CATH 14FRSI W/METER (CATHETERS) IMPLANT
UNDERPAD 30X30 INCONTINENT (UNDERPADS AND DIAPERS) ×2 IMPLANT
WATER STERILE IRR 1000ML POUR (IV SOLUTION) ×2 IMPLANT

## 2012-12-08 NOTE — Progress Notes (Signed)
BP elevated. Anesthesia aware.  Pt did not take her home meds today...will give p.o. Lasix and vasotec per MD order. 3500 RN aware and will continue to monitor BP.

## 2012-12-08 NOTE — Anesthesia Procedure Notes (Signed)
Procedure Name: Intubation Date/Time: 12/08/2012 8:38 AM Performed by: Elon Alas Pre-anesthesia Checklist: Patient identified, Timeout performed, Emergency Drugs available, Suction available and Patient being monitored Patient Re-evaluated:Patient Re-evaluated prior to inductionOxygen Delivery Method: Circle system utilized Preoxygenation: Pre-oxygenation with 100% oxygen Intubation Type: IV induction Ventilation: Mask ventilation without difficulty and Oral airway inserted - appropriate to patient size Laryngoscope Size: Mac and 3 Grade View: Grade III Tube type: Oral Tube size: 7.0 mm Number of attempts: 1 Airway Equipment and Method: Stylet Placement Confirmation: positive ETCO2,  ETT inserted through vocal cords under direct vision and breath sounds checked- equal and bilateral Secured at: 21 cm Tube secured with: Tape Dental Injury: Teeth and Oropharynx as per pre-operative assessment

## 2012-12-08 NOTE — H&P (Signed)
Subjective: Patient is a 67 y.o. female who is admitted for treatment of left C8 radiculopathy, with weakness of the left intrinsics and grip, and numbness in the medial dorsum of the left hand towards the fourth and fifth digits secondary to left C7-T1 neural foraminal stenosis secondary to cervical spondylosis and degenerative disc disease.  Patient had progressive symptoms over the past 4-5 years, which worsened particularly over the past 2-3 months with pain radiating from the neck down to the left shoulder, arm, forearm, and into the hand. Patient has been treated with anti-inflammatory medications and physical therapy without Aleve, and the weakness in her left hand has progressively worsened. She is admitted now for a left C7-T1 posterior cervical thoracic laminotomy and foraminotomy, and a bilateral C7-T1 posterior cervical arthrodesis with lateral mass screws and pedicle screws, and bone graft.  Patient Active Problem List   Diagnosis Date Noted  . Left cervical radiculopathy 11/11/2012  . Anemia 08/05/2011  . Routine health maintenance 08/05/2011  . CAROTID ARTERY DISEASE 01/08/2010  . MYALGIA 08/07/2009  . MELANOMA 05/21/2009  . VITAMIN D DEFICIENCY 05/21/2009  . ENDOMETRIAL HYPERPLASIA UNSPECIFIED 05/21/2009  . ROTATOR CUFF INJURY, LEFT SHOULDER 05/21/2009  . HYPOTHYROIDISM 07/27/2007  . HYPERLIPIDEMIA 07/27/2007  . HYPERTENSION 07/27/2007  . GERD 07/27/2007   Past Medical History  Diagnosis Date  . Cancer     Melanoma on back- Stage 1  . Pneumonia   . Arthritis     R knee and Left  foot  . Endometrial hyperplasia, unspecified   . Disorders of bursae and tendons in shoulder region, unspecified   . Unspecified vitamin D deficiency   . Other malaise and fatigue   . Unspecified hypothyroidism   . Melanoma of skin, site unspecified   . Family history of malignant neoplasm of gastrointestinal tract   . Other and unspecified hyperlipidemia   . Hypertension     takes Enalapril  daily  . Hyperlipidemia     takes Pravastatin daily  . Muscle spasms of head and/or neck     takes Zanaflex daily prn  . History of bronchitis 06/2012  . Weakness     numbness,tingling,pain left arm  . GERD (gastroesophageal reflux disease)     takes Protonix daily  . Nocturia   . Osteoporosis     takes Fosamax weekly on Sundays  . Insomnia     takes Restoril nightly  . Peripheral edema     takes Lasix daily    Past Surgical History  Procedure Laterality Date  . Knee arthroscopy  2012  . Tumor removal  2009    From uterus  . Rotator cuff repair  2010    Left  . Total knee arthroplasty  07/07/2011    Procedure: TOTAL KNEE ARTHROPLASTY;  Surgeon: Raymon Mutton, MD;  Location: [facility] OR;  Service: Orthopedics;  Laterality: Right;  . Tubal ligation    . Melanoma excision  2006    near scapula  . Tumor excision  2008    endometrial  . Colonoscopy    . Esophagogastroduodenoscopy      Prescriptions prior to admission  Medication Sig Dispense Refill  . Ascorbic Acid (VITAMIN C PO) Take 1 tablet by mouth daily.      . Calcium Citrate-Vitamin D (CALCIUM CITRATE + D PO) Take 1 tablet by mouth daily.        . cholecalciferol (VITAMIN D) 1000 UNITS tablet Take 1,000 Units by mouth daily.      . enalapril (VASOTEC)  20 MG tablet Take 1 tablet (20 mg total) by mouth daily.  30 tablet  11  . furosemide (LASIX) 40 MG tablet Take 20 mg by mouth daily.      [name] HYDROcodone-acetaminophen (NORCO/VICODIN) 5-325 MG per tablet Take 1 tablet by mouth every 6 (six) hours as needed for pain.      . Magnesium-Zinc (MAGNESIUM-CHELATED ZINC PO) Take 1 tablet by mouth 3 (three) times daily.      . Multiple Vitamin (MULTIVITAMIN WITH MINERALS) TABS Take 1 tablet by mouth daily.      . pantoprazole (PROTONIX) 40 MG tablet Take 1 tablet (40 mg total) by mouth daily.  30 tablet  11  . pravastatin (PRAVACHOL) 40 MG tablet Take 1 tablet (40 mg total) by mouth daily.  30 tablet  11  . temazepam (RESTORIL) 30 MG  capsule Take 30 mg by mouth at bedtime as needed for sleep.      [name] tiZANidine (ZANAFLEX) 4 MG tablet Take 4 mg by mouth 2 (two) times daily as needed (muscle spasms).      [name] alendronate (FOSAMAX) 70 MG tablet Take 70 mg by mouth every 7 (seven) days. Sundays       No Known Allergies  History  Substance Use Topics  . Smoking status: Never Smoker   . Smokeless tobacco: Not on file  . Alcohol Use: Yes     Comment: socially    Family History  Problem Relation Age of Onset  . Emphysema Father     deceased at 33  . Heart failure Father     CHF  . Hypertension Mother     deceased at 69  . Other Mother     DJD  . Heart disease Sister     deceased  . Arthritis Sister     x2  . Drug abuse Sister     deceased; drug overdose  . Hypertension Sister   . Thyroid disease Sister   . Hypertension Brother   . Hyperlipidemia Brother   . Heart attack Paternal Grandmother   . Heart attack Paternal Uncle   . Coronary artery disease Other     Strong Hx paternal kinship  . Breast cancer Neg Hx   . Ovarian cancer Neg Hx      Review of Systems A comprehensive review of systems was negative.  Objective: Vital signs in last 24 hours: Temp:  [97.3 F (36.3 C)] 97.3 F (36.3 C) (06/11 0656) Pulse Rate:  [59] 59 (06/11 0656) Resp:  [18] 18 (06/11 0656) BP: (149)/(83) 149/83 mmHg (06/11 0656) SpO2:  [98 %] 98 % (06/11 0656)  EXAM: Patient is a well-developed well-nourished white female in no acute distress. Lungs are clear to auscultation , the patient has symmetrical respiratory excursion. Heart has a regular rate and rhythm normal S1 and S2 no murmur.   Abdomen is soft nontender nondistended bowel sounds are present. Extremity examination shows no clubbing cyanosis or edema. Musculoskeletal examination shows no tenderness to palpation over the cervical spinous processes or paracervical musculature. She has a full range of motion neck. Neurologic examination shows 5/5 strength in the right  upper extremity including the deltoid, biceps, triceps, intrinsics, and grip. However in the left upper extremity the deltoid, biceps, triceps are 5/5, but the intrinsics are 4/5 in the grip is 4+/5. Sensation is intact to pinprick in the digits the upper extremities. Reflexes in the biceps and brachial radialis are 1 bilaterally, left triceps is minimal, the right triceps is one,  the quadriceps are 2 bilaterally, gastrocnemius are minimal bilaterally. Toes are downgoing bilaterally. She has a normal gait and stance.  Data Review:CBC    Component Value Date/Time   WBC 8.4 12/06/2012 1441   RBC 4.75 12/06/2012 1441   HGB 12.8 12/06/2012 1441   HCT 39.7 12/06/2012 1441   PLT 321 12/06/2012 1441   MCV 83.6 12/06/2012 1441   MCH 26.9 12/06/2012 1441   MCHC 32.2 12/06/2012 1441   RDW 13.9 12/06/2012 1441   LYMPHSABS 1.6 07/07/2012 1448   MONOABS 0.7 07/07/2012 1448   EOSABS 0.1 07/07/2012 1448   BASOSABS 0.1 07/07/2012 1448                          BMET    Component Value Date/Time   NA 142 12/06/2012 1441   K 4.4 12/06/2012 1441   CL 102 12/06/2012 1441   CO2 28 12/06/2012 1441   GLUCOSE 85 12/06/2012 1441   GLUCOSE 103* 07/08/2006 0918   BUN 24* 12/06/2012 1441   CREATININE 0.85 12/06/2012 1441   CALCIUM 9.5 12/06/2012 1441   CALCIUM 9.4 05/21/2009 1842   GFRNONAA 68* 12/06/2012 1441   GFRAA 79* 12/06/2012 1441     Assessment/Plan: Patient is a left C8 radiculopathy with weakness of the left intrinsics and grip, segments a left C7-T1 neural foraminal stenosis. Patient is admitted now for left C7-T1 posterior cervical thoracic laminotomy foraminotomy, and a bilateral C7-T1 posterior cervical arthrodesis with lateral mass and pedicle screw fixation and bone graft.I've discussed with the patient the nature of his condition, the nature the surgical procedure, the typical length of surgery, hospital stay, and overall recuperation. We discussed limitations postoperatively. I discussed risks of surgery including risks of infection,  bleeding, possibly need for transfusion, the risk of nerve root dysfunction with pain, weakness, numbness, or paresthesias, the risk of spinal cord dysfunction with paralysis of all 4 limbs and quadriplegia, and the risk of dural tear and CSF leakage and possible need for further surgery, the risk of failure of the arthrodesis and the possible need for further surgery, and the risk of anesthetic complications including myocardial infarction, stroke, pneumonia, and death. We also discussed the need for postoperative immobilization in a cervical collar. Understanding all this the patient does wish to proceed with surgery and is admitted for such.    Hewitt Shorts, MD 12/08/2012 7:12 AM

## 2012-12-08 NOTE — Progress Notes (Signed)
Filed Vitals:   12/08/12 1230 12/08/12 1240 12/08/12 1305 12/08/12 1620  BP: 192/87 182/90 160/71 167/87  Pulse: 55 57 56 66  Temp:   97.6 F (36.4 C) 98.6 F (37 C)  TempSrc:      Resp: 13     SpO2:  97% 94% 93%    Patient doing well following surgery. Excellent relief of radicular pain. Having moderate incisional discomfort. Has been up and ambulating in the halls. Has voided. Mild weakness of intrinsic musculature of left hand, probably somewhat better than prior to surgery. Wound clean and dry.  Have given prescriptions for Norco 5/325 1-2 3 times a day when necessary pain, 50 tablets no refills and Percocet 5/325 1-2 each bedtime when necessary pain, 30 tablets no refills. Given instructions regarding wound care and activities following discharge.  Patient to be seen by Dr. Marikay Alar tomorrow, as I will be out of town until Monday, June 16.  Plan: Continue to progress through postoperative recovery.  Hewitt Shorts, MD 12/08/2012, 7:25 PM

## 2012-12-08 NOTE — Anesthesia Preprocedure Evaluation (Addendum)
Anesthesia Evaluation  Patient identified by MRN, date of birth, ID band Patient awake    Reviewed: Allergy & Precautions, H&P , NPO status , Patient's Chart, lab work & pertinent test results  History of Anesthesia Complications Negative for: history of anesthetic complications  Airway Mallampati: II TM Distance: >3 FB Neck ROM: Full    Dental  (+) Dental Advisory Given and Teeth Intact   Pulmonary neg pulmonary ROS, neg pneumonia -,  breath sounds clear to auscultation  Pulmonary exam normal       Cardiovascular hypertension, Pt. on medications + Peripheral Vascular Disease Rhythm:Regular Rate:Normal     Neuro/Psych    GI/Hepatic Neg liver ROS, GERD-  Medicated and Controlled,  Endo/Other  Hypothyroidism Morbid obesity  Renal/GU negative Renal ROS     Musculoskeletal  (+) Arthritis - (narcotics), Osteoarthritis,    Abdominal (+) + obese,   Peds  Hematology negative hematology ROS (+)   Anesthesia Other Findings   Reproductive/Obstetrics                          Anesthesia Physical Anesthesia Plan  ASA: III  Anesthesia Plan: General   Post-op Pain Management:    Induction: Intravenous  Airway Management Planned: Oral ETT  Additional Equipment:   Intra-op Plan:   Post-operative Plan: Extubation in OR  Informed Consent: I have reviewed the patients History and Physical, chart, labs and discussed the procedure including the risks, benefits and alternatives for the proposed anesthesia with the patient or authorized representative who has indicated his/her understanding and acceptance.   Dental advisory given  Plan Discussed with: Anesthesiologist, Surgeon and CRNA  Anesthesia Plan Comments: (Plan routine monitors, GETA)       Anesthesia Quick Evaluation

## 2012-12-08 NOTE — Plan of Care (Signed)
Problem: Consults Goal: Diagnosis - Spinal Surgery Outcome: Completed/Met Date Met:  12/08/12 Cervical Spine Fusion

## 2012-12-08 NOTE — Op Note (Signed)
12/08/2012  11:24 AM  PATIENT:  Michelle [name]  67 y.o. female  PRE-OPERATIVE DIAGNOSIS:  Left C7-T1 neural foraminal stenosis, cervical spondylosis without myelopathy, left C8 cervical radiculopathy  POST-OPERATIVE DIAGNOSIS:  Left C7-T1 neural foraminal stenosis, left C7-T1 synovial cyst, cervical spondylosis without myelopathy, left C8 radiculopathy  PROCEDURE:  Procedure(s): Left C/7-T/1 Posterior Cervicothoracic laminotomy/foraminotomy and resection of synovial cyst with microdissection, microsurgical technique, and the operating microscope; C/7-T/1 Posterior Cervical thoracic Arthrodesis with C-arm fluoroscopic guidance and with Nuvasive vuepoint C7 with lateral mass screws, T1 pedicle screws, and rods, and Vitoss and infuse.  SURGEON:  Surgeon(s): Hewitt Shorts, MD Tia Alert, MD  ASSISTANTS: Marikay Alar, M.D.  ANESTHESIA:   general  EBL:  Total I/O In: 1400 [I.V.:1400] Out: 100 [Blood:100]  BLOOD ADMINISTERED:none  COUNT: correct per nursing staff  DICTATION: Patient was brought to the operating room, placed under general endotracheal anesthesia. The radiolucent 3 pin [place] head holder was applied, the patient was turned to a prone position. The posterior neck and upper back were prepped with Betadine soap and solution and draped in a sterile fashion. The midline was infiltrated with local site with epinephrine. The C7-T1 level was identified with C-arm fluoroscopic guidance, and then a vertical midline incision was made over the C7-T1 level. Dissection was carried down to the posterior cervical fascia was incised bilaterally, and the paracervical musculature was dissected the spinous process lamina in a subperiosteal fashion. Localization of the C7-T1 level was done with C-arm fluoroscopic guidance, and then the operating microscope was draped and brought in the field provided medication, illumination, and visualization, and the remainder the decompression was performed  using microdissection and microsurgical technique. A left C7-T1 posterior cervical laminotomy was performed. There is significant left C7-T1 facet arthropathy, and as the ligament of flavum was removed we encountered a moderately large synovial cyst extending from the dorsal lateral aspect the spinal canal, and compressing the thecal sac, and extending laterally into the left C7-T1 neural foramen. It was freed from its adhesions to the dura, and removed in a piecemeal fashion. We then continued the decompression laterally, performing a foraminotomy for the left C8 nerve root. There was additional synovial cyst compressing the superior aspect of the nerve root, and this was removed as well. In the end the entire synovial cyst was removed, and good decompression of the thecal sac and exiting left C8 nerve root was achieved. We then proceeded with the arthrodesis using C-arm fluoroscopic guidance throughout the procedure. We identified entry points for the lateral mass screws bilaterally at C7. A pilot hole was made, and then we drilled in a superior lateral and anterior trajectory. The drill hole was examined a ball probe bilaterally, and good bony surfaces were found, the posterior cortex was tapped, and then we placed 3.5 x 12 mm screws bilaterally in the lateral masses of C7. Then with C-arm fluoroscopic guidance we identified entry points for the T1 pedicles bilaterally. A pilot hole was made, and then we drilled into the pedicle and T1 vertebral body. The drill hole was again examined the ball probe, good bony surfaces were found, the posterior cortex was tapped, and we placed 3.5 x 20 mm screws bilaterally. We then cut a 60 mm rod to 2 segments less than 20 mm in length. They were placed within the screw heads and secured with locking caps. Final tightening was performed of all the locking caps against a counter torque. We then decorticated the right C7 and T1 lamina  and transverse processes and facet joint. We  packed a combination of infuse and Vitoss over the bony surfaces on the right side. The was irrigated numerous times the procedure with saline solution and bacitracin solution, good hemostasis was established, and then we proceeded with closure deep fascia was closed interrupted undyed 0 Vicryl sutures, Scarpa's fascia was closed with interrupted undyed 0 Vicryl sutures, subcutaneous subcuticular closed interrupted inverted 2-0 Vicryl sutures. Skin is approximate Dermabond.  PLAN OF CARE: Admit for overnight observation  PATIENT DISPOSITION:  PACU - hemodynamically stable.   Delay start of Pharmacological VTE agent (>24hrs) due to surgical blood loss or risk of bleeding:  yes

## 2012-12-08 NOTE — Preoperative (Signed)
Beta Blockers   Reason not to administer Beta Blockers:Not Applicable 

## 2012-12-08 NOTE — Anesthesia Postprocedure Evaluation (Signed)
  Anesthesia Post-op Note  Patient: Michelle [name]  Procedure(s) Performed: Procedure(s) with comments: Left C/7-T/1 Posterior Cervicothoracic laminotomy/foraminotomy; C/7-T/1 Posterior Cervical Arthrodesis  (Left) - Left cervical seven-thoracic one posterior cervicothoracic laminectomy and foraminotomy, cervical seven-thoracic one posterior arthrodesis   Patient Location: PACU  Anesthesia Type:General  Level of Consciousness: awake, alert , oriented and patient cooperative  Airway and Oxygen Therapy: Patient Spontanous Breathing and Patient connected to nasal cannula oxygen  Post-op Pain: mild  Post-op Assessment: Post-op Vital signs reviewed, Patient's Cardiovascular Status Stable, Respiratory Function Stable, Patent Airway, No signs of Nausea or vomiting and Pain level controlled  Post-op Vital Signs: Reviewed and stable  Complications: No apparent anesthesia complications

## 2012-12-08 NOTE — Transfer of Care (Signed)
Immediate Anesthesia Transfer of Care Note  Patient: Michelle [name]  Procedure(s) Performed: Procedure(s) with comments: Left C/7-T/1 Posterior Cervicothoracic laminotomy/foraminotomy; C/7-T/1 Posterior Cervical Arthrodesis  (Left) - Left cervical seven-thoracic one posterior cervicothoracic laminectomy and foraminotomy, cervical seven-thoracic one posterior arthrodesis   Patient Location: PACU  Anesthesia Type:General  Level of Consciousness: awake, alert  and oriented  Airway & Oxygen Therapy: Patient Spontanous Breathing and Patient connected to nasal cannula oxygen  Post-op Assessment: Report given to PACU RN, Post -op Vital signs reviewed and stable and Patient moving all extremities X 4  Post vital signs: Reviewed and stable  Complications: No apparent anesthesia complications

## 2012-12-09 ENCOUNTER — Encounter (HOSPITAL_COMMUNITY): Payer: Self-pay | Admitting: Neurosurgery

## 2012-12-09 NOTE — Discharge Summary (Signed)
Physician Discharge Summary  Patient ID: Michelle [name] MRN: [id number] DOB/AGE: 69-Nov-1947 69 y.o.  Admit date: 12/08/2012 Discharge date: 12/09/2012  Admission Diagnoses: C7-T1 spondylosis    Discharge Diagnoses: Same   Discharged Condition: good  Hospital Course: The patient was admitted on 12/08/2012 and taken to the operating room where the patient underwent C7-T1 decompression and fusion. The patient tolerated the procedure well and was taken to the recovery room and then to the floor in stable condition. The hospital course was routine. There were no complications. The wound remained clean dry and intact. Pt had appropriate neck soreness. No complaints of arm pain or new N/T/W. The patient remained afebrile with stable vital signs, and tolerated a regular diet. The patient continued to increase activities, and pain was well controlled with oral pain medications.   Consults: None  Significant Diagnostic Studies:  Results for orders placed during the hospital encounter of 12/06/12  SURGICAL PCR SCREEN      Result Value Range   MRSA, PCR NEGATIVE  NEGATIVE   Staphylococcus aureus NEGATIVE  NEGATIVE  BASIC METABOLIC PANEL      Result Value Range   Sodium 142  135 - 145 mEq/L   Potassium 4.4  3.5 - 5.1 mEq/L   Chloride 102  96 - 112 mEq/L   CO2 28  19 - 32 mEq/L   Glucose, Bld 85  70 - 99 mg/dL   BUN 24 (*) 6 - 23 mg/dL   Creatinine, Ser 7.82  0.50 - 1.10 mg/dL   Calcium 9.5  8.4 - 95.6 mg/dL   GFR calc non Af Amer 68 (*) >90 mL/min   GFR calc Af Amer 79 (*) >90 mL/min  CBC      Result Value Range   WBC 8.4  4.0 - 10.5 K/uL   RBC 4.75  3.87 - 5.11 MIL/uL   Hemoglobin 12.8  12.0 - 15.0 g/dL   HCT 21.3  08.6 - 57.8 %   MCV 83.6  78.0 - 100.0 fL   MCH 26.9  26.0 - 34.0 pg   MCHC 32.2  30.0 - 36.0 g/dL   RDW 46.9  62.9 - 52.8 %   Platelets 321  150 - 400 K/uL    Dg Chest 2 View  12/06/2012   *RADIOLOGY REPORT*  Clinical Data: Hypertension  CHEST - 2 VIEW  Comparison:  06/26/2011  Findings: The cardiac shadow is stable.  The lungs are clear bilaterally. Degenerative changes of the thoracic spine are again seen.  IMPRESSION: No acute abnormality is noted.   Original Report Authenticated By: Alcide Clever, M.D.   Dg Lumbar Spine 2-3 Views  12/08/2012   *RADIOLOGY REPORT*  Clinical Data: C7-T1 posterior fusion  LUMBAR SPINE - 2-3 VIEW  Comparison: Radiography 11/30/2012  Findings: Two C-arm images are provided.  A frontal view shows a probe overlying the C7-T1 region.  The exact location is difficult to discern with certainty.  Lateral view shows tissue spreaders posteriorly with a probe overlying the cervicothoracic region, again difficult to precisely localize given this limited date of.  IMPRESSION: Localization images in the cervicothoracic junction region. Precise levels cannot be determined from these limited images.   Original Report Authenticated By: Paulina Fusi, M.D.   Mr Cervical Spine Wo Contrast  11/18/2012   *RADIOLOGY REPORT*  Clinical Data: Neck pain with left arm pain and numbness  MRI CERVICAL SPINE WITHOUT CONTRAST  Technique:  Multiplanar and multiecho pulse sequences of the cervical spine, to include the craniocervical  junction and cervicothoracic junction, were obtained according to standard protocol without intravenous contrast.  Comparison: Cervical MRI 10/20/2008  Findings: Normal cervical alignment.  Negative for fracture or mass.  No cord compression.  Spinal cord signal is normal. Brainstem is normal in signal.  C2-3:  Negative  C3-4:  Negative  C4-5: Progressive disc degeneration and spondylosis.  Mild spinal stenosis with flattening of the cord.  C5-6:  Disc degeneration and spondylosis which has progressed since the prior MRI.  Mild to moderate spinal stenosis also has progressed.  There is foraminal narrowing bilaterally.  C6-7:  Mild disc degeneration and spondylosis with mild spinal stenosis  C7-T1:  Mild disc degeneration.  Left foraminal  narrowing due to spurring.  No cord deformity.  IMPRESSION: Progression of disc degeneration and spondylosis at C4-5 with mild spinal stenosis  Progressive disc degeneration and spondylosis at C5-6 with moderate spinal stenosis.  Mild spinal stenosis C6-7.  Left foraminal encroachment C7-T1 related to spurring.   Original Report Authenticated By: Janeece Riggers, M.D.    Antibiotics:  Anti-infectives   Start     Dose/Rate Route Frequency Ordered Stop   12/08/12 0955  bacitracin 50,000 Units in sodium chloride irrigation 0.9 % 500 mL irrigation  Status:  Discontinued       As needed 12/08/12 0959 12/08/12 1123   12/08/12 0804  bacitracin 16109 UNITS injection    Comments:  KEY, JENNIFER: cabinet override      12/08/12 0804 12/08/12 2014   12/08/12 0600  ceFAZolin (ANCEF) IVPB 2 g/50 mL premix     2 g 100 mL/hr over 30 Minutes Intravenous On call to O.R. 12/07/12 1412 12/08/12 0848      Discharge Exam: Blood pressure 167/81, pulse 63, temperature 98.3 F (36.8 C), temperature source Oral, resp. rate 18, SpO2 94.00%. Neurologic: Grossly normal Incision clean dry and  Discharge Medications:     Medication List    TAKE these medications       alendronate 70 MG tablet  Commonly known as:  FOSAMAX  Take 70 mg by mouth every 7 (seven) days. Sundays     ANACIN PO  Take 2 tablets by mouth daily as needed (for pain).     CALCIUM CITRATE + D PO  Take 1 tablet by mouth daily.     cholecalciferol 1000 UNITS tablet  Commonly known as:  VITAMIN D  Take 1,000 Units by mouth daily.     enalapril 20 MG tablet  Commonly known as:  VASOTEC  Take 1 tablet (20 mg total) by mouth daily.     furosemide 40 MG tablet  Commonly known as:  LASIX  Take 20 mg by mouth daily.     HYDROcodone-acetaminophen 5-325 MG per tablet  Commonly known as:  NORCO/VICODIN  Take 1 tablet by mouth every 6 (six) hours as needed for pain.     HYDROcodone-acetaminophen 5-325 MG per tablet  Commonly known as:   NORCO  Take 1-2 tablets by mouth 3 (three) times daily as needed for pain.     MAGNESIUM-CHELATED ZINC PO  Take 1 tablet by mouth 3 (three) times daily.     multivitamin with minerals Tabs  Take 1 tablet by mouth daily.     oxyCODONE-acetaminophen 5-325 MG per tablet  Commonly known as:  ROXICET  Take 1-2 tablets by mouth at bedtime as needed for pain.     oxyCODONE-acetaminophen 5-325 MG per tablet  Commonly known as:  PERCOCET/ROXICET  Take 1 tablet by mouth daily as  needed for pain.     pantoprazole 40 MG tablet  Commonly known as:  PROTONIX  Take 1 tablet (40 mg total) by mouth daily.     pravastatin 40 MG tablet  Commonly known as:  PRAVACHOL  Take 1 tablet (40 mg total) by mouth daily.     temazepam 30 MG capsule  Commonly known as:  RESTORIL  Take 30 mg by mouth at bedtime as needed for sleep.     tiZANidine 4 MG tablet  Commonly known as:  ZANAFLEX  Take 4 mg by mouth 2 (two) times daily as needed (muscle spasms).     VITAMIN C PO  Take 1 tablet by mouth daily.        Disposition: home   Final Dx: C7-T1 decompression and fusion       Signed: Evaleigh Mccamy S 12/09/2012, 9:56 AM

## 2012-12-09 NOTE — Progress Notes (Signed)
Pt doing well. Pt and husband given D/C instructions with Rx's, verbal understanding given. Pt D/C'd home via wheelchair @ 1033 per MD order. Rema Fendt, RN

## 2013-02-02 ENCOUNTER — Other Ambulatory Visit: Payer: Self-pay | Admitting: Internal Medicine

## 2013-02-03 NOTE — Telephone Encounter (Signed)
Temazepam called to pharmacy  

## 2013-03-29 ENCOUNTER — Other Ambulatory Visit: Payer: Self-pay | Admitting: Internal Medicine

## 2013-03-30 NOTE — Telephone Encounter (Signed)
Hydrocodone called to pharmacy  

## 2013-05-16 ENCOUNTER — Ambulatory Visit (HOSPITAL_COMMUNITY): Payer: PRIVATE HEALTH INSURANCE | Attending: Cardiology

## 2013-05-16 DIAGNOSIS — E785 Hyperlipidemia, unspecified: Secondary | ICD-10-CM | POA: Insufficient documentation

## 2013-05-16 DIAGNOSIS — I658 Occlusion and stenosis of other precerebral arteries: Secondary | ICD-10-CM | POA: Insufficient documentation

## 2013-05-16 DIAGNOSIS — I10 Essential (primary) hypertension: Secondary | ICD-10-CM | POA: Insufficient documentation

## 2013-05-16 DIAGNOSIS — I6529 Occlusion and stenosis of unspecified carotid artery: Secondary | ICD-10-CM | POA: Insufficient documentation

## 2013-06-12 ENCOUNTER — Other Ambulatory Visit: Payer: Self-pay | Admitting: Internal Medicine

## 2013-06-25 ENCOUNTER — Other Ambulatory Visit: Payer: Self-pay | Admitting: Internal Medicine

## 2013-06-30 ENCOUNTER — Other Ambulatory Visit: Payer: Self-pay | Admitting: Internal Medicine

## 2013-07-21 ENCOUNTER — Ambulatory Visit (INDEPENDENT_AMBULATORY_CARE_PROVIDER_SITE_OTHER): Payer: PRIVATE HEALTH INSURANCE | Admitting: Internal Medicine

## 2013-07-21 ENCOUNTER — Encounter: Payer: Self-pay | Admitting: Internal Medicine

## 2013-07-21 ENCOUNTER — Other Ambulatory Visit (INDEPENDENT_AMBULATORY_CARE_PROVIDER_SITE_OTHER): Payer: PRIVATE HEALTH INSURANCE

## 2013-07-21 VITALS — BP 134/88 | HR 69 | Temp 98.1°F | Ht 59.0 in | Wt 174.0 lb

## 2013-07-21 DIAGNOSIS — E785 Hyperlipidemia, unspecified: Secondary | ICD-10-CM

## 2013-07-21 DIAGNOSIS — E039 Hypothyroidism, unspecified: Secondary | ICD-10-CM

## 2013-07-21 DIAGNOSIS — I10 Essential (primary) hypertension: Secondary | ICD-10-CM

## 2013-07-21 DIAGNOSIS — E559 Vitamin D deficiency, unspecified: Secondary | ICD-10-CM

## 2013-07-21 DIAGNOSIS — C439 Malignant melanoma of skin, unspecified: Secondary | ICD-10-CM

## 2013-07-21 DIAGNOSIS — I6529 Occlusion and stenosis of unspecified carotid artery: Secondary | ICD-10-CM

## 2013-07-21 DIAGNOSIS — D649 Anemia, unspecified: Secondary | ICD-10-CM

## 2013-07-21 DIAGNOSIS — Z0000 Encounter for general adult medical examination without abnormal findings: Secondary | ICD-10-CM

## 2013-07-21 DIAGNOSIS — K219 Gastro-esophageal reflux disease without esophagitis: Secondary | ICD-10-CM

## 2013-07-21 DIAGNOSIS — E669 Obesity, unspecified: Secondary | ICD-10-CM

## 2013-07-21 DIAGNOSIS — Z23 Encounter for immunization: Secondary | ICD-10-CM

## 2013-07-21 DIAGNOSIS — E66811 Obesity, class 1: Secondary | ICD-10-CM

## 2013-07-21 LAB — LIPID PANEL
CHOLESTEROL: 237 mg/dL — AB (ref 0–200)
HDL: 50 mg/dL (ref >39.00)
Total CHOL/HDL Ratio: 5
Triglycerides: 82 mg/dL (ref 0.0–149.0)
VLDL: 16.4 mg/dL (ref 0.0–40.0)

## 2013-07-21 LAB — HEPATIC FUNCTION PANEL
ALBUMIN: 4.3 g/dL (ref 3.5–5.2)
ALT: 26 U/L (ref 0–35)
AST: 19 U/L (ref 0–37)
Alkaline Phosphatase: 74 U/L (ref 39–117)
Bilirubin, Direct: 0.1 mg/dL (ref 0.0–0.3)
Total Bilirubin: 0.8 mg/dL (ref 0.3–1.2)
Total Protein: 7.9 g/dL (ref 6.0–8.3)

## 2013-07-21 LAB — LDL CHOLESTEROL, DIRECT: Direct LDL: 178.5 mg/dL

## 2013-07-21 MED ORDER — OXYCODONE-ACETAMINOPHEN 5-325 MG PO TABS: 1.0000 | ORAL_TABLET | Freq: Every day | ORAL | Status: DC | PRN

## 2013-07-21 NOTE — Patient Instructions (Signed)
Thanks for coming in.  For the pain in the left shoulder neck area you may want to consider massage/PT - Dr. Bjorn Loser, DPT does a really good job.  For the foot pain - [facility] is a good group of podiatrists with offices in [place].  You will have routine labs today and the results will be posted to MyChart.  Thank you for allowing me the privilege of being your Doctor. My partners will take very good care of you and Jimmy.

## 2013-07-21 NOTE — Progress Notes (Signed)
Subjective:    Patient ID: Michelle [name], female    DOB: [birth year], 68 y.o.   MRN: [id number]  HPI The patient is here for annual Medicare wellness examination and management of other chronic and acute problems.  Interval history - June '14 had C7-T1 decompression procedure. She did well but 2 weeks later she felt a sensation like a rubber band snapping. She discussed this with Dr. Sherwood Gambler. She has had continued pain across the top of the shoulder worse Left, and with doing dishes. She  had "needling" prior to surgery but none since.   She has otherwise been doing well. She will be seeing her gynecologist next month and will also get mammogram. She did participate in [facility] - her results were good. She also had a follow up Carotid doppler Nov '14 at [facility].   The risk factors are reflected in the social history.  The roster of all physicians providing medical care to patient - is listed in the Snapshot section of the chart.  Activities of daily living:  The patient is 100% inedpendent in all ADLs: dressing, toileting, feeding as well as independent mobility   Home safety : The patient has smoke detectors in the home. Falls - none. Home is fall safe They wear seatbelts. firearms are present in the home, kept in a safe fashion. There is no violence in the home.   There is no risks for hepatitis, STDs or HIV. There is no history of blood transfusion. They have no travel history to infectious disease endemic areas of the world.  The patient has seen their dentist in the last six month. They have not seen their eye doctor in the last year. They deny  any hearing difficulty and have not had audiologic testing in the last year.    They do not  have excessive sun exposure. Discussed the need for sun protection: hats, long sleeves and use of sunscreen if there is significant sun exposure.   Diet: the importance of a healthy diet is discussed. They do have a healthy diet.  The  patient has a regular exercise program: walking (not so much in cold weather) Will join [employer].  The benefits of regular aerobic exercise were discussed.  Depression screen: there are no signs or vegative symptoms of depression- irritability, change in appetite, anhedonia, sadness/tearfullness.  Cognitive assessment: the patient manages all their financial and personal affairs and is actively engaged.   The following portions of the patient's history were reviewed and updated as appropriate: allergies, current medications, past family history, past medical history,  past surgical history, past social history  and problem list.  Vision, hearing, body mass index were assessed and reviewed.   During the course of the visit the patient was educated and counseled about appropriate screening and preventive services including : fall prevention , diabetes screening, nutrition counseling, colorectal cancer screening, and recommended immunizations.  Past Medical History  Diagnosis Date  . Cancer     Melanoma on back- Stage 1  . Pneumonia   . Arthritis     R knee and Left  foot  . Endometrial hyperplasia, unspecified   . Disorders of bursae and tendons in shoulder region, unspecified   . Unspecified vitamin D deficiency   . Other malaise and fatigue   . Unspecified hypothyroidism   . Melanoma of skin, site unspecified   . Family history of malignant neoplasm of gastrointestinal tract   . Other and unspecified hyperlipidemia   .  Hypertension     takes Enalapril daily  . Hyperlipidemia     takes Pravastatin daily  . Muscle spasms of head and/or neck     takes Zanaflex daily prn  . History of bronchitis 06/2012  . Weakness     numbness,tingling,pain left arm  . GERD (gastroesophageal reflux disease)     takes Protonix daily  . Nocturia   . Osteoporosis     takes Fosamax weekly on Sundays  . Insomnia     takes Restoril nightly  . Peripheral edema     takes Lasix daily   Past Surgical  History  Procedure Laterality Date  . Knee arthroscopy  2012  . Tumor removal  2009    From uterus  . Rotator cuff repair  2010    Left  . Total knee arthroplasty  07/07/2011    Procedure: TOTAL KNEE ARTHROPLASTY;  Surgeon: Rudean Haskell, MD;  Location: [facility];  Service: Orthopedics;  Laterality: Right;  . Tubal ligation    . Melanoma excision  2006    near scapula  . Tumor excision  2008    endometrial  . Colonoscopy    . Esophagogastroduodenoscopy    . Posterior cervical fusion/foraminotomy Left 12/08/2012    Procedure: Left C/7-T/1 Posterior Cervicothoracic laminotomy/foraminotomy; C/7-T/1 Posterior Cervical Arthrodesis ;  Surgeon: Hosie Spangle, MD;  Location: [facility];  Service: Neurosurgery;  Laterality: Left;  Left cervical seven-thoracic one posterior cervicothoracic laminectomy and foraminotomy, cervical seven-thoracic one posterior arthrodesis   . Spine surgery  June/2014   Family History  Problem Relation Age of Onset  . Emphysema Father     deceased at 43  . Heart failure Father     CHF  . Hypertension Mother     deceased at 6  . Other Mother     DJD  . Heart disease Sister     deceased  . Arthritis Sister     x2  . Drug abuse Sister     deceased; drug overdose  . Hypertension Sister   . Thyroid disease Sister   . Hypertension Brother   . Hyperlipidemia Brother   . Heart attack Paternal Grandmother   . Heart attack Paternal Uncle   . Coronary artery disease Other     Strong Hx paternal kinship  . Breast cancer Neg Hx   . Ovarian cancer Neg Hx    History   Social History  . Marital Status: Married    Spouse Name: N/A    Number of Children: 4  . Years of Education: N/A   Occupational History  .     Social History Main Topics  . Smoking status: Never Smoker   . Smokeless tobacco: Not on file  . Alcohol Use: Yes     Comment: socially  . Drug Use: No  . Sexual Activity: Yes   Other Topics Concern  . Not on file   Social History  Narrative   Married '63; 1 son '63, 3 daughter-'66, '79, '81; grandchildren 7 [18 to 1 year]   Work: did work in [employer].    Current Outpatient Prescriptions on File Prior to Visit  Medication Sig Dispense Refill  . alendronate (FOSAMAX) 70 MG tablet Take 70 mg by mouth every 7 (seven) days. Sundays      . alendronate (FOSAMAX) 70 MG tablet TAKE 1 TABLET EVERY 7 DAYS WITH A FULL GLASS OF WATER ON AN EMPTY STOMACH. TAKE ON SUNDAY  4 tablet  11  .  Ascorbic Acid (VITAMIN C PO) Take 1 tablet by mouth daily.      . Aspirin (ANACIN PO) Take 2 tablets by mouth daily as needed (for pain).      . Calcium Citrate-Vitamin D (CALCIUM CITRATE + D PO) Take 1 tablet by mouth daily.        . cholecalciferol (VITAMIN D) 1000 UNITS tablet Take 1,000 Units by mouth daily.      . enalapril (VASOTEC) 20 MG tablet Take 1 tablet (20 mg total) by mouth daily.  30 tablet  11  . furosemide (LASIX) 40 MG tablet Take 20 mg by mouth daily.      . Magnesium-Zinc (MAGNESIUM-CHELATED ZINC PO) Take 1 tablet by mouth 3 (three) times daily.      . Multiple Vitamin (MULTIVITAMIN WITH MINERALS) TABS Take 1 tablet by mouth daily.      [name] oxyCODONE-acetaminophen (PERCOCET/ROXICET) 5-325 MG per tablet Take 1 tablet by mouth daily as needed for pain.      [name] oxyCODONE-acetaminophen (ROXICET) 5-325 MG per tablet Take 1-2 tablets by mouth at bedtime as needed for pain.  30 tablet  0  . pravastatin (PRAVACHOL) 40 MG tablet Take 1 tablet (40 mg total) by mouth daily.  30 tablet  11  . temazepam (RESTORIL) 30 MG capsule TAKE ONE CAPSULE BY MOUTH EVERY DAY  30 capsule  3  . pantoprazole (PROTONIX) 40 MG tablet Take 1 tablet (40 mg total) by mouth daily.  30 tablet  11   No current facility-administered medications on file prior to visit.       Review of Systems Constitutional:  Negative for fever, chills, activity change and unexpected weight change.  HEENT:  Negative for hearing loss, ear pain, congestion, neck stiffness  and postnasal drip. Negative for sore throat or swallowing problems. Negative for dental complaints.   Eyes: Negative for vision loss or change in visual acuity.  Respiratory: Negative for chest tightness and wheezing. Negative for DOE.   Cardiovascular: Negative for chest pain or palpitations. No decreased exercise tolerance Gastrointestinal: No change in bowel habit. No bloating or gas. No reflux or indigestion Genitourinary: Negative for urgency, frequency, flank pain and difficulty urinating.  Musculoskeletal: Negative for myalgias, back pain, arthralgias and gait problem.  Neurological: Negative for dizziness, tremors, weakness and headaches.  Hematological: Negative for adenopathy.  Psychiatric/Behavioral: Negative for behavioral problems and dysphoric mood.       Objective:   Physical Exam Filed Vitals:   07/21/13 1339  BP: 134/88  Pulse: 69  Temp: 98.1 F (36.7 C)   Wt Readings from Last 3 Encounters:  07/21/13 174 lb (78.926 kg)  12/06/12 176 lb 5.9 oz (80 kg)  11/11/12 178 lb (80.74 kg)   Gen'l: well nourished, well developed Woman in no distress HEENT - [place]/AT, EACs/TMs normal, oropharynx with native dentition in good condition, no buccal or palatal lesions, posterior pharynx clear, mucous membranes moist. C&S clear, PERRLA, fundi - normal Neck - supple, no thyromegaly Nodes- negative submental, cervical, supraclavicular regions Chest - no deformity, no CVAT Lungs - clear without rales, wheezes. No increased work of breathing Breast - deferred to gyn & mammography Cardiovascular - regular rate and rhythm, quiet precordium, no murmurs, rubs or gallops, 2+ radial, DP and PT pulses Abdomen - BS+ x 4, no HSM, no guarding or rebound or tenderness Pelvic - deferred to gyn Rectal - deferred to gyn Extremities - no clubbing, cyanosis, edema or deformity.  Neuro - A&O x 3, CN II-XII normal,  motor strength normal and equal, DTRs 2+ and symmetrical biceps, radial, and patellar  tendons. Cerebellar - no tremor, no rigidity, fluid movement and normal gait. Derm - Head, neck, back, abdomen and extremities without suspicious lesions  Recent Results (from the past 2160 hour(s))  HEPATIC FUNCTION PANEL     Status: None   Collection Time    07/21/13  2:33 PM      Result Value Range   Total Bilirubin 0.8  0.3 - 1.2 mg/dL   Bilirubin, Direct 0.1  0.0 - 0.3 mg/dL   Alkaline Phosphatase 74  39 - 117 U/L   AST 19  0 - 37 U/L   ALT 26  0 - 35 U/L   Total Protein 7.9  6.0 - 8.3 g/dL   Albumin 4.3  3.5 - 5.2 g/dL  LIPID PANEL     Status: Abnormal   Collection Time    07/21/13  2:33 PM      Result Value Range   Cholesterol 237 (*) 0 - 200 mg/dL   Comment: ATP III Classification       Desirable:  < 200 mg/dL               Borderline High:  200 - 239 mg/dL          High:  > = 240 mg/dL   Triglycerides 82.0  0.0 - 149.0 mg/dL   Comment: Normal:  <150 mg/dLBorderline High:  150 - 199 mg/dL   HDL 50.00  >39.00 mg/dL   VLDL 16.4  0.0 - 40.0 mg/dL   Total CHOL/HDL Ratio 5     Comment:                Men          Women1/2 Average Risk     3.4          3.3Average Risk          5.0          4.42X Average Risk          9.6          7.13X Average Risk          15.0          11.0                      LDL CHOLESTEROL, DIRECT     Status: None   Collection Time    07/21/13  2:33 PM      Result Value Range   Direct LDL 178.5     Comment: Optimal:  <100 mg/dLNear or Above Optimal:  100-129 mg/dLBorderline High:  130-159 mg/dLHigh:  160-189 mg/dLVery High:  >190 mg/dL           Assessment & Plan:

## 2013-07-21 NOTE — Progress Notes (Signed)
Pre visit review using our clinic review tool, if applicable. No additional management support is needed unless otherwise documented below in the visit note. 

## 2013-07-22 MED ORDER — ATORVASTATIN CALCIUM 40 MG PO TABS: 40.0000 mg | ORAL_TABLET | Freq: Every day | ORAL | Status: DC

## 2013-07-24 ENCOUNTER — Encounter: Payer: Self-pay | Admitting: Internal Medicine

## 2013-07-24 DIAGNOSIS — E669 Obesity, unspecified: Secondary | ICD-10-CM | POA: Insufficient documentation

## 2013-07-24 DIAGNOSIS — E66811 Obesity, class 1: Secondary | ICD-10-CM | POA: Insufficient documentation

## 2013-07-24 MED ORDER — PANTOPRAZOLE SODIUM 40 MG PO TBEC: 40.0000 mg | DELAYED_RELEASE_TABLET | Freq: Every day | ORAL | Status: DC

## 2013-07-24 NOTE — Assessment & Plan Note (Signed)
Interval history has been unremarkable. Limited physical exam normal except for weight. Labs reviewed - LDL too high, otherwise normal lab. She is current with colorectal and breast cancer screening. Immunizations are up to date.  In summary  A very nice woman who appears medically stable but needs better control of cholesterol and conscientious weight management.

## 2013-07-24 NOTE — Assessment & Plan Note (Signed)
Followed on a regular basis by dermatologist in [place], no report of recurrent or new lesions.

## 2013-07-24 NOTE — Assessment & Plan Note (Addendum)
Lipid panel reveals LDL greater than 160 - on pravastatin 40 mg daily. Need to increase antilipemic effect -changing to more effective medication.   Plan lipitor 40 mg q PM  F/u lab 4 weeks.

## 2013-07-24 NOTE — Assessment & Plan Note (Signed)
Lab Results  Component Value Date   HGB 12.8 12/06/2012   Normal Hgb - problem resolved

## 2013-07-24 NOTE — Assessment & Plan Note (Signed)
Stable with no change on doppler examination from July '11 to Nov '14

## 2013-07-24 NOTE — Assessment & Plan Note (Signed)
Discussed the need to become more active in weight management.   Plan - Diet management: smart food choices, PORTION SIZE CONTROL, regular exercise. Goal - to loose 1-2 lbs.month. Target weight - 150 lbs

## 2013-07-24 NOTE — Assessment & Plan Note (Signed)
No complaints at this visit.  Plan Renewed Rx for pantoprazole

## 2013-07-24 NOTE — Assessment & Plan Note (Signed)
Tolerating ACE-I and diuretic. She has stopped taking diltiazem. BP Readings from Last 3 Encounters:  07/21/13 134/88  12/09/12 167/81  12/09/12 167/81   BP is ok today.  Plan Continue two drug regimen  Monitor BP at home and report back for elevated readings.

## 2013-07-24 NOTE — Assessment & Plan Note (Signed)
Last vitamin D level was in normal range.  Plan Continue otc Vit D daily

## 2013-07-24 NOTE — Assessment & Plan Note (Signed)
Lab Results  Component Value Date   TSH 1.02 07/07/2012   Normal range thyroid function.

## 2013-07-27 ENCOUNTER — Telehealth: Payer: Self-pay | Admitting: *Deleted

## 2013-07-27 NOTE — Telephone Encounter (Signed)
Patient left vm stating that she went to pharmacy to pick up her meds. Call pt back

## 2013-07-28 NOTE — Telephone Encounter (Signed)
I called and spoke to patients. I let her know per Dr Linda Hedges plan in her office note she is to d/c pravastatin 40 mg and start Lipitor. She states she did get this at the pharmacy. Med list shows this.

## 2013-08-08 ENCOUNTER — Encounter: Payer: Medicare Other | Admitting: Internal Medicine

## 2013-08-09 ENCOUNTER — Other Ambulatory Visit: Payer: Self-pay | Admitting: Internal Medicine

## 2013-10-13 ENCOUNTER — Ambulatory Visit: Payer: Self-pay | Admitting: Obstetrics and Gynecology

## 2013-10-17 ENCOUNTER — Other Ambulatory Visit: Payer: Self-pay | Admitting: *Deleted

## 2013-10-17 MED ORDER — TEMAZEPAM 30 MG PO CAPS: 30.0000 mg | ORAL_CAPSULE | Freq: Every evening | ORAL | Status: AC | PRN

## 2013-10-17 NOTE — Telephone Encounter (Signed)
[facility].../lmb 

## 2013-10-25 ENCOUNTER — Ambulatory Visit: Payer: Self-pay | Admitting: Obstetrics and Gynecology

## 2014-04-25 ENCOUNTER — Encounter: Payer: Self-pay | Admitting: Physician Assistant

## 2014-05-03 ENCOUNTER — Ambulatory Visit (INDEPENDENT_AMBULATORY_CARE_PROVIDER_SITE_OTHER): Payer: PRIVATE HEALTH INSURANCE | Admitting: Physician Assistant

## 2014-05-03 ENCOUNTER — Encounter: Payer: Self-pay | Admitting: Physician Assistant

## 2014-05-03 VITALS — BP 162/106 | HR 64 | Ht 59.0 in | Wt 176.2 lb

## 2014-05-03 DIAGNOSIS — R194 Change in bowel habit: Secondary | ICD-10-CM

## 2014-05-03 DIAGNOSIS — R197 Diarrhea, unspecified: Secondary | ICD-10-CM

## 2014-05-03 DIAGNOSIS — R1319 Other dysphagia: Secondary | ICD-10-CM

## 2014-05-03 DIAGNOSIS — R1013 Epigastric pain: Secondary | ICD-10-CM

## 2014-05-03 MED ORDER — GLYCOPYRROLATE 2 MG PO TABS: 2.0000 mg | ORAL_TABLET | Freq: Two times a day (BID) | ORAL | Status: DC

## 2014-05-03 MED ORDER — POLYETHYLENE GLYCOL 3350 17 GM/SCOOP PO POWD: 1.0000 | Freq: Every day | ORAL | Status: DC

## 2014-05-03 NOTE — Progress Notes (Signed)
Reviewed and agree.

## 2014-05-03 NOTE — Patient Instructions (Addendum)
You have been scheduled for a colonoscopy and Endiscopy. Please follow written instructions given to you at your visit today.  Please pick up your prep kit at the pharmacy within the next 1-3 days.Generic Miralax prescription to [facility] [place], [place]. If you use inhalers (even only as needed), please bring them with you on the day of your procedure. Your physician has requested that you go to www.startemmi.com and enter the access code given to you at your visit today. This web site gives a general overview about your procedure. However, you should still follow specific instructions given to you by our office regarding your preparation for the procedure.  You have been scheduled for an abdominal ultrasound at [facility] (1st floor of hospital) on Monday 05-08-2014.  Arrive at 8:15 am for an 8:30 am test.  Do not have anything to eat or drink 6 hours prior to your appointment. Should you need to reschedule your appointment, please contact radiology at [phone]. This test typically takes about 30 minutes to perform.;

## 2014-05-03 NOTE — Progress Notes (Signed)
Subjective:    Patient ID: Michelle [name], female    DOB: 03/26/1946, 68 y.o.   MRN: [id number]  HPI  Michelle [name] is a pleasant 68 year old white female referred today by Dr. Coletta Memos  for evaluation of subxiphoid and epigastric pain. She is known remotely to Dr. Olevia Perches.She was last seen here in 2006 when she had colonoscopy done for surveillance and this was normal and also had EGD done at that time also normal. She is status post cervical laminectomy in 2014, also has history of hypertension hyperlipidemia and hypothyroidism. She has a lot of arthritis symptoms and takes 2 aspirin on a daily basis. Over the past couple of months she has been noticing a burning type discomfort with eating in her lower chest. She says something feels irritated and it hurts when food goes by this area. She has had some mild dysphagia with this and sometimes has to briefly stop eating but has not had any regurgitation. She also has been having discomfort in her epigastrium as well as bloating postprandially. Her appetite has been fair her weight has been stable. She tried Protonix briefly but did not notice much difference and did not stay on it. She also relates having looser bowel movements over the past one year. She says usually in the morning she will have to make 3 or 4 trips back to the bathroom and feels like she just doesn't completely evacuate her bowel but her stools are staying very loose. She has not noted any melena or hematochezia.    Review of Systems  Constitutional: Negative.   HENT: Positive for trouble swallowing.   Eyes: Negative.   Cardiovascular: Negative.   Gastrointestinal: Positive for abdominal pain, diarrhea and abdominal distention.  Endocrine: Negative.   Genitourinary: Negative.   Musculoskeletal: Negative.   Allergic/Immunologic: Negative.   Neurological: Negative.   Hematological: Negative.   Psychiatric/Behavioral: Negative.    Outpatient Prescriptions Prior to Visit  Medication Sig  Dispense Refill  . alendronate (FOSAMAX) 70 MG tablet TAKE 1 TABLET EVERY 7 DAYS WITH A FULL GLASS OF WATER ON AN EMPTY STOMACH. TAKE ON SUNDAY 4 tablet 11  . Ascorbic Acid (VITAMIN C PO) Take 1 tablet by mouth daily.    . Aspirin (ANACIN PO) Take 2 tablets by mouth daily as needed (for pain).    . Calcium Citrate-Vitamin D (CALCIUM CITRATE + D PO) Take 1 tablet by mouth daily.      . cholecalciferol (VITAMIN D) 1000 UNITS tablet Take 1,000 Units by mouth daily.    . enalapril (VASOTEC) 20 MG tablet TAKE 1 TABLET (20 MG TOTAL) BY MOUTH DAILY. 30 tablet 11  . furosemide (LASIX) 40 MG tablet TAKE 1/2 TABLET BY MOUTH DAILY 30 tablet 10  . Magnesium-Zinc (MAGNESIUM-CHELATED ZINC PO) Take 1 tablet by mouth 3 (three) times daily.    . Multiple Vitamin (MULTIVITAMIN WITH MINERALS) TABS Take 1 tablet by mouth daily.    [name] oxyCODONE-acetaminophen (PERCOCET/ROXICET) 5-325 MG per tablet Take 1 tablet by mouth daily as needed. (Patient taking differently: Take 1 tablet by mouth daily as needed. Pt takes 1/2 tablet, by mouth, as needed) 90 tablet 0  . pantoprazole (PROTONIX) 40 MG tablet Take 1 tablet (40 mg total) by mouth daily. 30 tablet 11  . temazepam (RESTORIL) 30 MG capsule Take 1 capsule (30 mg total) by mouth at bedtime as needed for sleep. 30 capsule 3  . furosemide (LASIX) 40 MG tablet Take 20 mg by mouth daily.    [name]  atorvastatin (LIPITOR) 40 MG tablet Take 1 tablet (40 mg total) by mouth daily. 30 tablet 3   No facility-administered medications prior to visit.  No Known Allergies Patient Active Problem List   Diagnosis Date Noted  . Obesity, Class I, BMI 30-34.9 07/24/2013  . Left cervical radiculopathy 11/11/2012  . Anemia 08/05/2011  . Routine health maintenance 08/05/2011  . CAROTID ARTERY DISEASE 01/08/2010  . MYALGIA 08/07/2009  . MELANOMA 05/21/2009  . VITAMIN D DEFICIENCY 05/21/2009  . ENDOMETRIAL HYPERPLASIA UNSPECIFIED 05/21/2009  . ROTATOR CUFF INJURY, LEFT SHOULDER 05/21/2009    . HYPOTHYROIDISM 07/27/2007  . HYPERLIPIDEMIA 07/27/2007  . HYPERTENSION 07/27/2007  . GERD 07/27/2007   History  Substance Use Topics  . Smoking status: Never Smoker   . Smokeless tobacco: Not on file  . Alcohol Use: 0.0 oz/week    0 Not specified per week     Comment: Occassionally   family history includes Arthritis in her sister; Coronary artery disease in her other; Drug abuse in her sister; Emphysema in her father; Heart attack in her paternal grandmother and paternal uncle; Heart disease in her father, mother, and sister; Heart failure in her father; Hyperlipidemia in her brother; Hypertension in her brother, mother, and sister; Other in her mother; Pancreatitis in her brother; Thyroid disease in her sister. There is no history of Breast cancer, Ovarian cancer, Colon cancer, Colon polyps, or Esophageal cancer.      Objective:   Physical Examwell-developed older white female in no acute distress, pleasant blood pressure 162/106 pulse 64 height 4 foot 11 weight 176. HEENT; nontraumatic normocephalic EOMI PERRLA sclera anicteric, Supple ;no JVD, Cardiovascular; regular rate and rhythm with S1-S2 no murmur or gallop, Pulm; clear bilaterally, Abdomen; soft she is tender in the epigastrium there is no guarding or rebound no palpable mass or hepatosplenomegaly bowel sounds are present, Rectal; exam not done, Extremities; no clubbing cyanosis or edema skin warm and dry, Psych ;mood and affect appropriate        Assessment & Plan:  #79  68 year old female with 2-3 month history of subxiphoid and epigastric pain with intermittent dysphagia. Patient does use aspirin on a chronic basis raising possibility of gastropathy or peptic ulcer disease. We'll also need to rule out distal esophageal stricture #2 change in bowel habits over the past 1 year with persistently loose stools etiology not clear #3 hypertension #4 hypothyroidism #5 hyperlipidemia  Plan; patient is to restart Protonix 40 mg  by mouth every morning We discussed discontinuing aspirin use or at least decreasing dosage. He says she's not sure she can function without some aspirin so have asked her to decrease to one daily Schedule for upper abdominal ultrasound Add trial of Robinul Forte 2 mg by mouth every morning Add a daily probiotic Schedule for colonoscopy and EGD with Dr. Olevia Perches. Procedures were discussed in detail with the patient and she is agreeable to proceed

## 2014-05-08 ENCOUNTER — Ambulatory Visit (HOSPITAL_COMMUNITY)
Admission: RE | Admit: 2014-05-08 | Discharge: 2014-05-08 | Disposition: A | Discharge status: Home / Self Care | Payer: PRIVATE HEALTH INSURANCE | Source: Ambulatory Visit | Attending: Diagnostic Radiology | Admitting: Diagnostic Radiology

## 2014-05-08 DIAGNOSIS — R1013 Epigastric pain: Secondary | ICD-10-CM

## 2014-05-08 DIAGNOSIS — K8020 Calculus of gallbladder without cholecystitis without obstruction: Secondary | ICD-10-CM | POA: Diagnosis not present

## 2014-06-02 ENCOUNTER — Encounter: Payer: Self-pay | Admitting: Internal Medicine

## 2014-06-02 ENCOUNTER — Other Ambulatory Visit: Payer: Self-pay | Admitting: Internal Medicine

## 2014-06-02 ENCOUNTER — Other Ambulatory Visit: Payer: Self-pay | Admitting: *Deleted

## 2014-06-02 ENCOUNTER — Telehealth: Payer: Self-pay | Admitting: *Deleted

## 2014-06-02 ENCOUNTER — Ambulatory Visit (AMBULATORY_SURGERY_CENTER): Payer: PRIVATE HEALTH INSURANCE | Admitting: Internal Medicine

## 2014-06-02 ENCOUNTER — Other Ambulatory Visit (INDEPENDENT_AMBULATORY_CARE_PROVIDER_SITE_OTHER): Payer: PRIVATE HEALTH INSURANCE

## 2014-06-02 VITALS — BP 186/91 | HR 57 | Temp 96.9°F | Resp 39 | Ht 59.0 in | Wt 176.0 lb

## 2014-06-02 DIAGNOSIS — R194 Change in bowel habit: Secondary | ICD-10-CM

## 2014-06-02 DIAGNOSIS — K2950 Unspecified chronic gastritis without bleeding: Secondary | ICD-10-CM

## 2014-06-02 DIAGNOSIS — K219 Gastro-esophageal reflux disease without esophagitis: Secondary | ICD-10-CM

## 2014-06-02 DIAGNOSIS — K635 Polyp of colon: Secondary | ICD-10-CM

## 2014-06-02 DIAGNOSIS — K222 Esophageal obstruction: Secondary | ICD-10-CM

## 2014-06-02 DIAGNOSIS — K209 Esophagitis, unspecified without bleeding: Secondary | ICD-10-CM

## 2014-06-02 DIAGNOSIS — R197 Diarrhea, unspecified: Secondary | ICD-10-CM

## 2014-06-02 DIAGNOSIS — D122 Benign neoplasm of ascending colon: Secondary | ICD-10-CM

## 2014-06-02 DIAGNOSIS — D124 Benign neoplasm of descending colon: Secondary | ICD-10-CM

## 2014-06-02 DIAGNOSIS — R1310 Dysphagia, unspecified: Secondary | ICD-10-CM

## 2014-06-02 DIAGNOSIS — K2960 Other gastritis without bleeding: Secondary | ICD-10-CM

## 2014-06-02 DIAGNOSIS — K2900 Acute gastritis without bleeding: Secondary | ICD-10-CM

## 2014-06-02 DIAGNOSIS — Z1211 Encounter for screening for malignant neoplasm of colon: Secondary | ICD-10-CM

## 2014-06-02 DIAGNOSIS — R1013 Epigastric pain: Secondary | ICD-10-CM

## 2014-06-02 MED ORDER — SODIUM CHLORIDE 0.9 % IV SOLN: 500.0000 mL | INTRAVENOUS | Status: DC

## 2014-06-02 NOTE — Telephone Encounter (Signed)
Per Dr. Olevia Perches, CBC, CMET, Sed rate, B12, TSH today. Schedule HIDA with [facility]. Spoke with Irine Seal and scheduled HIDA with [facility] at [facility] radiology on 06/26/14 at 7:30 AM. NPO 8 hours prior, no stomach or pain medications 6 hours prior. [facility] recovery aware and will tell patient.

## 2014-06-02 NOTE — Progress Notes (Signed)
A/ox3 pleased with MAC, report to Celia RN 

## 2014-06-02 NOTE — Progress Notes (Signed)
Labs drawn in recovery room.

## 2014-06-02 NOTE — Progress Notes (Signed)
Called to room to assist during endoscopic procedure.  Patient ID and intended procedure confirmed with present staff. Received instructions for my participation in the procedure from the performing physician.  

## 2014-06-02 NOTE — Patient Instructions (Signed)
Discharge instructions given with verbal understanding. Handouts on polyps,diverticulosis,dilataion diet,gastritis and esophagistis. Resume previous medications. hida scan schedule and instructions given. YOU HAD AN ENDOSCOPIC PROCEDURE TODAY AT [facility]: Refer to the procedure report that was given to you for any specific questions about what was found during the examination.  If the procedure report does not answer your questions, please call your gastroenterologist to clarify.  If you requested that your care partner not be given the details of your procedure findings, then the procedure report has been included in a sealed envelope for you to review at your convenience later.  YOU SHOULD EXPECT: Some feelings of bloating in the abdomen. Passage of more gas than usual.  Walking can help get rid of the air that was put into your GI tract during the procedure and reduce the bloating. If you had a lower endoscopy (such as a colonoscopy or flexible sigmoidoscopy) you may notice spotting of blood in your stool or on the toilet paper. If you underwent a bowel prep for your procedure, then you may not have a normal bowel movement for a few days.  DIET: Your first meal following the procedure should be a light meal and then it is ok to progress to your normal diet.  A half-sandwich or bowl of soup is an example of a good first meal.  Heavy or fried foods are harder to digest and may make you feel nauseous or bloated.  Likewise meals heavy in dairy and vegetables can cause extra gas to form and this can also increase the bloating.  Drink plenty of fluids but you should avoid alcoholic beverages for 24 hours.  ACTIVITY: Your care partner should take you home directly after the procedure.  You should plan to take it easy, moving slowly for the rest of the day.  You can resume normal activity the day after the procedure however you should NOT DRIVE or use heavy machinery for 24 hours (because of  the sedation medicines used during the test).    SYMPTOMS TO REPORT IMMEDIATELY: A gastroenterologist can be reached at any hour.  During normal business hours, 8:30 AM to 5:00 PM Monday through Friday, call [phone].  After hours and on weekends, please call the GI answering service at [phone] who will take a message and have the physician on call contact you.   Following lower endoscopy (colonoscopy or flexible sigmoidoscopy):  Excessive amounts of blood in the stool  Significant tenderness or worsening of abdominal pains  Swelling of the abdomen that is new, acute  Fever of 100F or higher  Following upper endoscopy (EGD)  Vomiting of blood or coffee ground material  New chest pain or pain under the shoulder blades  Painful or persistently difficult swallowing  New shortness of breath  Fever of 100F or higher  Black, tarry-looking stools  FOLLOW UP: If any biopsies were taken you will be contacted by phone or by letter within the next 1-3 weeks.  Call your gastroenterologist if you have not heard about the biopsies in 3 weeks.  Our staff will call the home number listed on your records the next business day following your procedure to check on you and address any questions or concerns that you may have at that time regarding the information given to you following your procedure. This is a courtesy call and so if there is no answer at the home number and we have not heard from you through the emergency physician on  call, we will assume that you have returned to your regular daily activities without incident.  SIGNATURES/CONFIDENTIALITY: You and/or your care partner have signed paperwork which will be entered into your electronic medical record.  These signatures attest to the fact that that the information above on your After Visit Summary has been reviewed and is understood.  Full responsibility of the confidentiality of this discharge information lies with you and/or your  care-partner.

## 2014-06-03 LAB — CBC WITH DIFFERENTIAL/PLATELET
BASOS PCT: 1.3 % (ref 0.0–3.0)
Basophils Absolute: 0.1 10*3/uL (ref 0.0–0.1)
Eosinophils Absolute: 0.1 10*3/uL (ref 0.0–0.7)
Eosinophils Relative: 0.7 % (ref 0.0–5.0)
HCT: 41.6 % (ref 36.0–46.0)
Hemoglobin: 13.4 g/dL (ref 12.0–15.0)
Lymphocytes Relative: 12.8 % (ref 12.0–46.0)
Lymphs Abs: 1.2 10*3/uL (ref 0.7–4.0)
MCHC: 32.3 g/dL (ref 30.0–36.0)
MCV: 82.7 fl (ref 78.0–100.0)
MONO ABS: 1 10*3/uL (ref 0.1–1.0)
Monocytes Relative: 10.1 % (ref 3.0–12.0)
NEUTROS ABS: 7.3 10*3/uL (ref 1.4–7.7)
Neutrophils Relative %: 75.1 % (ref 43.0–77.0)
Platelets: 256 10*3/uL (ref 150.0–400.0)
RBC: 5.03 Mil/uL (ref 3.87–5.11)
RDW: 14.5 % (ref 11.5–15.5)
WBC: 9.7 10*3/uL (ref 4.0–10.5)

## 2014-06-03 LAB — SEDIMENTATION RATE: Sed Rate: 14 mm/hr (ref 0–22)

## 2014-06-05 ENCOUNTER — Telehealth: Payer: Self-pay | Admitting: *Deleted

## 2014-06-05 LAB — TSH: TSH: 3.95 u[IU]/mL (ref 0.35–4.50)

## 2014-06-05 LAB — VITAMIN B12: Vitamin B-12: 693 pg/mL (ref 211–911)

## 2014-06-05 NOTE — Telephone Encounter (Signed)
  Follow up Call-  Call back number 06/02/2014  Post procedure Call Back phone  # [phone]  Permission to leave phone message Yes     Patient questions:  Do you have a fever, pain , or abdominal swelling? No. Pain Score  0 *  Have you tolerated food without any problems? Yes.    Have you been able to return to your normal activities? Yes.    Do you have any questions about your discharge instructions: Diet   No. Medications  No. Follow up visit  No.  Do you have questions or concerns about your Care? No.  Actions: * If pain score is 4 or above: No action needed, pain <4.

## 2014-06-06 ENCOUNTER — Encounter: Payer: Self-pay | Admitting: Internal Medicine

## 2014-06-07 NOTE — Op Note (Addendum)
[place]  [address] [place], [address]   ENDOSCOPY PROCEDURE REPORT  PATIENT: Michelle, [name]  MR#: [id number] BIRTHDATE: 04/25/1946 , 39  yrs. old GENDER: female ENDOSCOPIST: Lafayette Dragon, MD REFERRED BY:  Bernerd Limbo, M.D. PROCEDURE DATE:  06/02/2014 PROCEDURE:  EGD w/ biopsy and Maloney dilation of esophagus ASA CLASS:     Class II INDICATIONS:  epigastric pain, dysphagia, and prior EGD 2006, new finding of gallstones on abd.  ultrasound. MEDICATIONS: Monitored anesthesia care and Propofol 150 mg IV TOPICAL ANESTHETIC: none  DESCRIPTION OF PROCEDURE: After the risks benefits and alternatives of the procedure were thoroughly explained, informed consent was obtained.  The LB PFC-H190 [id number] endoscope was introduced through the mouth and advanced to the second portion of the duodenum , Without limitations.  The instrument was slowly withdrawn as the mucosa was fully examined.      ESOPHAGUS: There was LA Class A esophagitis (One or more mucosal breaks < 5 mm in maximal length) noted.   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 The stricture was easily traversable.  The stricture was dilated using a 35m (48Fr) Maloney dilator.  STOMACH: A small non-bleeding, shallow and round ulcer was found. Biopsies were taken at edge of the ulcer.  DUODENUM: The duodenal mucosa showed no abnormalities in the bulb and 2nd part of the duodenum.Biopsies were obtained to r/o Barrett's esophagus.  Retroflexed views revealed no abnormalities. The scope was then withdrawn from the patient and the procedure completed.  COMPLICATIONS: There were no immediate complications.  ENDOSCOPIC IMPRESSION: 1.   There was LA Class A esophagitis noted , s/p biopsies to r/o Barrett's esophagus 2.   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   Small ulcer was found in prepyloric antrum; biopsies were taken  4.   The duodenal mucosa showed no abnormalities in the bulb and 2nd part of the duodenum  RECOMMENDATIONS: 1.  Await pathology results 2.  Anti-reflux regimen to be follow 3.  Continue PPI 4. No ASA, start Diclofenac 75 mg, #90 , 1 po qd 5..HMarland KitchenDA scan with CCK 6. CBC, C-met, sed rate  REPEAT EXAM: for EGD pending biopsy results.  eSigned:  DoLafayette DragonMD 06/05/2014 5:19 PM Revised: 06/05/2014 5:19 PM   CC: Photodocumentation not available today

## 2014-06-07 NOTE — Op Note (Addendum)
[place]  [address] [place], [address]   COLONOSCOPY PROCEDURE REPORT  PATIENT: Michelle [name], Michelle [name]  MR#: [id number] BIRTHDATE: February 16, 1946 , 63  yrs. old GENDER: female ENDOSCOPIST: Lafayette Dragon, MD REFERRED TR:VUYEB Bouska, M.D. PROCEDURE DATE:  06/02/2014 PROCEDURE:   Colonoscopy with snare polypectomy First Screening Colonoscopy - Avg.  risk and is 50 yrs.  old or older - No.  Prior Negative Screening - Now for repeat screening. 10 or more years since last screening  History of Adenoma - Now for follow-up colonoscopy & has been > or = to 3 yrs.  N/A  Polyps Removed Today? Yes. ASA CLASS:   Class II INDICATIONS:average risk for colon cancer and last colon 2006. MEDICATIONS: Propofol 150 mg IV  DESCRIPTION OF PROCEDURE:   After the risks benefits and alternatives of the procedure were thoroughly explained, informed consent was obtained.  The digital rectal exam revealed no abnormalities of the rectum.   The LB PFC-H190 [id number]  endoscope was introduced through the anus and advanced to the cecum, which was identified by both the appendix and ileocecal valve. No adverse events experienced.   The quality of the prep was good, using MoviPrep  The instrument was then slowly withdrawn as the colon was fully examined.      COLON FINDINGS: Two semi-pedunculated polyps measuring 5 mm in size were found in the sigmoid colon and ascending colon.  A polypectomy was performed with a cold snare.  The resection was complete, the polyp tissue was completely retrieved and sent to histology. There was mild diverticulosis noted.  Retroflexed views revealed no abnormalities. The time to cecum=7 minutes 12 seconds.  Withdrawal time=8 minutes 10 seconds.  The scope was withdrawn and the procedure completed. COMPLICATIONS: There were no immediate complications.  ENDOSCOPIC IMPRESSION: 1.   Two semi-pedunculated polyps were found in the sigmoid colon and ascending colon;  polypectomy was performed with a cold snare 2.   Mild diverticulosis was noted  RECOMMENDATIONS: 1.  Await pathology results 2.  High fiber diet 3.No ASA x 2 weeks 4.Recall colon pending Path report  eSigned:  Lafayette Dragon, MD 06/05/2014 5:20 PM Revised: 06/05/2014 5:20 PM  cc: photodocumentation not available today

## 2014-06-14 ENCOUNTER — Other Ambulatory Visit: Payer: Self-pay | Admitting: *Deleted

## 2014-06-26 ENCOUNTER — Ambulatory Visit (HOSPITAL_COMMUNITY)
Admission: RE | Admit: 2014-06-26 | Discharge: 2014-06-26 | Disposition: A | Discharge status: Home / Self Care | Payer: PRIVATE HEALTH INSURANCE | Source: Ambulatory Visit | Attending: Internal Medicine | Admitting: Internal Medicine

## 2014-06-26 DIAGNOSIS — R1013 Epigastric pain: Secondary | ICD-10-CM

## 2014-06-26 DIAGNOSIS — K8080 Other cholelithiasis without obstruction: Secondary | ICD-10-CM | POA: Diagnosis present

## 2014-06-26 MED ORDER — SINCALIDE 5 MCG IJ SOLR
0.0200 ug/kg | Freq: Once | INTRAMUSCULAR | Status: AC
  Administered 2014-06-26: 1.6 ug via INTRAVENOUS

## 2014-06-26 MED ORDER — TECHNETIUM TC 99M MEBROFENIN IV KIT
5.5000 | PACK | Freq: Once | INTRAVENOUS | Status: AC | PRN
  Administered 2014-06-26: 6 via INTRAVENOUS

## 2014-09-08 ENCOUNTER — Ambulatory Visit: Payer: Self-pay | Admitting: Obstetrics and Gynecology

## 2014-12-18 ENCOUNTER — Other Ambulatory Visit: Payer: Self-pay | Admitting: Internal Medicine

## 2015-05-02 ENCOUNTER — Other Ambulatory Visit (HOSPITAL_COMMUNITY): Payer: Self-pay | Admitting: Family Medicine

## 2015-05-02 DIAGNOSIS — I6523 Occlusion and stenosis of bilateral carotid arteries: Secondary | ICD-10-CM

## 2015-05-10 ENCOUNTER — Ambulatory Visit (HOSPITAL_COMMUNITY)
Admission: RE | Admit: 2015-05-10 | Discharge: 2015-05-10 | Disposition: A | Discharge status: Home / Self Care | Payer: PRIVATE HEALTH INSURANCE | Source: Ambulatory Visit | Attending: Cardiology | Admitting: Cardiology

## 2015-05-10 DIAGNOSIS — I6523 Occlusion and stenosis of bilateral carotid arteries: Secondary | ICD-10-CM | POA: Insufficient documentation

## 2015-05-10 DIAGNOSIS — I10 Essential (primary) hypertension: Secondary | ICD-10-CM | POA: Diagnosis not present

## 2015-05-10 DIAGNOSIS — E785 Hyperlipidemia, unspecified: Secondary | ICD-10-CM | POA: Insufficient documentation

## 2015-09-07 ENCOUNTER — Telehealth: Payer: Self-pay | Admitting: General Practice

## 2015-09-07 NOTE — Telephone Encounter (Signed)
Reviewed pts chart. There is no record of one being completed. Pt informed of same.

## 2015-09-07 NOTE — Telephone Encounter (Signed)
Patient is requesting call back.  Use to be [facility] patient.  Would like to know if she every had a renal artery duplex.  Current MD would is wanting to do this but patient wants to make sure that she has never had one.

## 2015-09-11 ENCOUNTER — Other Ambulatory Visit: Payer: Self-pay | Admitting: Obstetrics and Gynecology

## 2015-09-11 DIAGNOSIS — Z1231 Encounter for screening mammogram for malignant neoplasm of breast: Secondary | ICD-10-CM

## 2015-09-19 ENCOUNTER — Ambulatory Visit
Admission: RE | Admit: 2015-09-19 | Discharge: 2015-09-19 | Disposition: A | Discharge status: Home / Self Care | Payer: PRIVATE HEALTH INSURANCE | Source: Ambulatory Visit | Attending: Obstetrics and Gynecology | Admitting: Obstetrics and Gynecology

## 2015-09-19 DIAGNOSIS — Z1231 Encounter for screening mammogram for malignant neoplasm of breast: Secondary | ICD-10-CM | POA: Diagnosis present

## 2015-09-25 ENCOUNTER — Other Ambulatory Visit: Payer: Self-pay | Admitting: Obstetrics and Gynecology

## 2015-09-25 DIAGNOSIS — N63 Unspecified lump in unspecified breast: Secondary | ICD-10-CM

## 2015-09-27 ENCOUNTER — Ambulatory Visit
Admission: RE | Admit: 2015-09-27 | Discharge: 2015-09-27 | Disposition: A | Discharge status: Home / Self Care | Payer: PRIVATE HEALTH INSURANCE | Source: Ambulatory Visit | Attending: Obstetrics and Gynecology | Admitting: Obstetrics and Gynecology

## 2015-09-27 ENCOUNTER — Other Ambulatory Visit: Payer: Self-pay | Admitting: Obstetrics and Gynecology

## 2015-09-27 DIAGNOSIS — N63 Unspecified lump in unspecified breast: Secondary | ICD-10-CM

## 2015-09-27 DIAGNOSIS — R921 Mammographic calcification found on diagnostic imaging of breast: Secondary | ICD-10-CM | POA: Diagnosis not present

## 2016-08-22 ENCOUNTER — Other Ambulatory Visit: Payer: Self-pay | Admitting: Obstetrics and Gynecology

## 2016-09-16 ENCOUNTER — Other Ambulatory Visit: Payer: Self-pay | Admitting: Obstetrics and Gynecology

## 2016-09-16 DIAGNOSIS — Z1231 Encounter for screening mammogram for malignant neoplasm of breast: Secondary | ICD-10-CM

## 2016-10-14 ENCOUNTER — Ambulatory Visit
Admission: RE | Admit: 2016-10-14 | Discharge: 2016-10-14 | Disposition: A | Discharge status: Home / Self Care | Payer: PRIVATE HEALTH INSURANCE | Source: Ambulatory Visit | Attending: Obstetrics and Gynecology | Admitting: Obstetrics and Gynecology

## 2016-10-14 DIAGNOSIS — Z1231 Encounter for screening mammogram for malignant neoplasm of breast: Secondary | ICD-10-CM | POA: Diagnosis not present

## 2017-09-22 ENCOUNTER — Other Ambulatory Visit: Payer: Self-pay | Admitting: Obstetrics and Gynecology

## 2017-09-22 DIAGNOSIS — Z1231 Encounter for screening mammogram for malignant neoplasm of breast: Secondary | ICD-10-CM

## 2017-10-15 ENCOUNTER — Ambulatory Visit
Admission: RE | Admit: 2017-10-15 | Discharge: 2017-10-15 | Disposition: A | Discharge status: Home / Self Care | Payer: 59 | Source: Ambulatory Visit | Attending: Obstetrics and Gynecology | Admitting: Obstetrics and Gynecology

## 2017-10-15 DIAGNOSIS — Z1231 Encounter for screening mammogram for malignant neoplasm of breast: Secondary | ICD-10-CM

## 2017-10-21 ENCOUNTER — Other Ambulatory Visit: Payer: Self-pay | Admitting: Obstetrics and Gynecology

## 2017-10-21 DIAGNOSIS — R928 Other abnormal and inconclusive findings on diagnostic imaging of breast: Secondary | ICD-10-CM

## 2017-10-21 DIAGNOSIS — N6489 Other specified disorders of breast: Secondary | ICD-10-CM

## 2017-10-28 ENCOUNTER — Ambulatory Visit
Admission: RE | Admit: 2017-10-28 | Discharge: 2017-10-28 | Disposition: A | Discharge status: Home / Self Care | Payer: 59 | Source: Ambulatory Visit | Attending: Obstetrics and Gynecology | Admitting: Obstetrics and Gynecology

## 2017-10-28 DIAGNOSIS — R922 Inconclusive mammogram: Secondary | ICD-10-CM | POA: Diagnosis not present

## 2017-10-28 DIAGNOSIS — N6489 Other specified disorders of breast: Secondary | ICD-10-CM

## 2017-10-28 DIAGNOSIS — R928 Other abnormal and inconclusive findings on diagnostic imaging of breast: Secondary | ICD-10-CM

## 2018-01-13 DIAGNOSIS — Z23 Encounter for immunization: Secondary | ICD-10-CM | POA: Diagnosis not present

## 2018-01-13 DIAGNOSIS — I10 Essential (primary) hypertension: Secondary | ICD-10-CM | POA: Diagnosis not present

## 2018-01-13 DIAGNOSIS — R5383 Other fatigue: Secondary | ICD-10-CM | POA: Diagnosis not present

## 2018-01-13 DIAGNOSIS — E785 Hyperlipidemia, unspecified: Secondary | ICD-10-CM | POA: Diagnosis not present

## 2018-01-13 DIAGNOSIS — Z1231 Encounter for screening mammogram for malignant neoplasm of breast: Secondary | ICD-10-CM | POA: Diagnosis not present

## 2018-04-05 DIAGNOSIS — Z23 Encounter for immunization: Secondary | ICD-10-CM | POA: Diagnosis not present

## 2018-04-27 DIAGNOSIS — J209 Acute bronchitis, unspecified: Secondary | ICD-10-CM | POA: Diagnosis not present

## 2018-04-27 DIAGNOSIS — J0100 Acute maxillary sinusitis, unspecified: Secondary | ICD-10-CM | POA: Diagnosis not present

## 2018-05-06 ENCOUNTER — Observation Stay (HOSPITAL_COMMUNITY)
Admission: EM | Admit: 2018-05-06 | Discharge: 2018-05-07 | Disposition: A | Discharge status: Home / Self Care | Payer: 59 | Attending: Student in an Organized Health Care Education/Training Program | Admitting: Student in an Organized Health Care Education/Training Program

## 2018-05-06 ENCOUNTER — Emergency Department (HOSPITAL_COMMUNITY): Payer: 59

## 2018-05-06 ENCOUNTER — Other Ambulatory Visit: Payer: Self-pay

## 2018-05-06 ENCOUNTER — Inpatient Hospital Stay (HOSPITAL_COMMUNITY): Payer: 59

## 2018-05-06 ENCOUNTER — Encounter (HOSPITAL_COMMUNITY): Payer: Self-pay | Admitting: Emergency Medicine

## 2018-05-06 DIAGNOSIS — E785 Hyperlipidemia, unspecified: Secondary | ICD-10-CM | POA: Insufficient documentation

## 2018-05-06 DIAGNOSIS — Z7983 Long term (current) use of bisphosphonates: Secondary | ICD-10-CM | POA: Diagnosis not present

## 2018-05-06 DIAGNOSIS — E559 Vitamin D deficiency, unspecified: Secondary | ICD-10-CM | POA: Insufficient documentation

## 2018-05-06 DIAGNOSIS — R600 Localized edema: Secondary | ICD-10-CM | POA: Insufficient documentation

## 2018-05-06 DIAGNOSIS — I3139 Other pericardial effusion (noninflammatory): Secondary | ICD-10-CM

## 2018-05-06 DIAGNOSIS — M62838 Other muscle spasm: Secondary | ICD-10-CM | POA: Diagnosis not present

## 2018-05-06 DIAGNOSIS — Z85820 Personal history of malignant melanoma of skin: Secondary | ICD-10-CM | POA: Diagnosis not present

## 2018-05-06 DIAGNOSIS — R0682 Tachypnea, not elsewhere classified: Secondary | ICD-10-CM | POA: Insufficient documentation

## 2018-05-06 DIAGNOSIS — M159 Polyosteoarthritis, unspecified: Secondary | ICD-10-CM

## 2018-05-06 DIAGNOSIS — G4700 Insomnia, unspecified: Secondary | ICD-10-CM | POA: Insufficient documentation

## 2018-05-06 DIAGNOSIS — R5382 Chronic fatigue, unspecified: Secondary | ICD-10-CM | POA: Diagnosis not present

## 2018-05-06 DIAGNOSIS — R0602 Shortness of breath: Secondary | ICD-10-CM | POA: Diagnosis not present

## 2018-05-06 DIAGNOSIS — R0600 Dyspnea, unspecified: Secondary | ICD-10-CM

## 2018-05-06 DIAGNOSIS — Z8701 Personal history of pneumonia (recurrent): Secondary | ICD-10-CM

## 2018-05-06 DIAGNOSIS — R011 Cardiac murmur, unspecified: Secondary | ICD-10-CM

## 2018-05-06 DIAGNOSIS — D649 Anemia, unspecified: Secondary | ICD-10-CM | POA: Diagnosis not present

## 2018-05-06 DIAGNOSIS — M810 Age-related osteoporosis without current pathological fracture: Secondary | ICD-10-CM | POA: Diagnosis not present

## 2018-05-06 DIAGNOSIS — Z79899 Other long term (current) drug therapy: Secondary | ICD-10-CM

## 2018-05-06 DIAGNOSIS — I10 Essential (primary) hypertension: Secondary | ICD-10-CM | POA: Insufficient documentation

## 2018-05-06 DIAGNOSIS — I313 Pericardial effusion (noninflammatory): Secondary | ICD-10-CM

## 2018-05-06 DIAGNOSIS — Z96651 Presence of right artificial knee joint: Secondary | ICD-10-CM | POA: Diagnosis not present

## 2018-05-06 DIAGNOSIS — R509 Fever, unspecified: Secondary | ICD-10-CM | POA: Diagnosis not present

## 2018-05-06 DIAGNOSIS — R9431 Abnormal electrocardiogram [ECG] [EKG]: Secondary | ICD-10-CM | POA: Diagnosis not present

## 2018-05-06 DIAGNOSIS — K219 Gastro-esophageal reflux disease without esophagitis: Secondary | ICD-10-CM | POA: Insufficient documentation

## 2018-05-06 DIAGNOSIS — G8929 Other chronic pain: Secondary | ICD-10-CM

## 2018-05-06 DIAGNOSIS — R05 Cough: Secondary | ICD-10-CM | POA: Diagnosis not present

## 2018-05-06 DIAGNOSIS — M25512 Pain in left shoulder: Secondary | ICD-10-CM

## 2018-05-06 HISTORY — DX: Pericardial effusion (noninflammatory): I31.3

## 2018-05-06 HISTORY — DX: Other pericardial effusion (noninflammatory): I31.39

## 2018-05-06 LAB — CBC WITH DIFFERENTIAL/PLATELET
ABS IMMATURE GRANULOCYTES: 0.13 10*3/uL — AB (ref 0.00–0.07)
BASOS ABS: 0 10*3/uL (ref 0.0–0.1)
Basophils Relative: 0 %
EOS ABS: 0.1 10*3/uL (ref 0.0–0.5)
Eosinophils Relative: 0 %
HEMATOCRIT: 39.7 % (ref 36.0–46.0)
HEMOGLOBIN: 11.7 g/dL — AB (ref 12.0–15.0)
Immature Granulocytes: 1 %
LYMPHS PCT: 8 %
Lymphs Abs: 1.3 10*3/uL (ref 0.7–4.0)
MCH: 24.8 pg — ABNORMAL LOW (ref 26.0–34.0)
MCHC: 29.5 g/dL — ABNORMAL LOW (ref 30.0–36.0)
MCV: 84.3 fL (ref 80.0–100.0)
MONOS PCT: 10 %
Monocytes Absolute: 1.6 10*3/uL — ABNORMAL HIGH (ref 0.1–1.0)
NEUTROS ABS: 13 10*3/uL — AB (ref 1.7–7.7)
NEUTROS PCT: 81 %
NRBC: 0 % (ref 0.0–0.2)
Platelets: 392 10*3/uL (ref 150–400)
RBC: 4.71 MIL/uL (ref 3.87–5.11)
RDW: 14.5 % (ref 11.5–15.5)
WBC: 16.1 10*3/uL — ABNORMAL HIGH (ref 4.0–10.5)

## 2018-05-06 LAB — URINALYSIS, COMPLETE (UACMP) WITH MICROSCOPIC
BILIRUBIN URINE: NEGATIVE
GLUCOSE, UA: NEGATIVE mg/dL
Hgb urine dipstick: NEGATIVE
KETONES UR: NEGATIVE mg/dL
LEUKOCYTES UA: NEGATIVE
Nitrite: NEGATIVE
PROTEIN: NEGATIVE mg/dL
Specific Gravity, Urine: >1.046 — ABNORMAL HIGH (ref 1.005–1.030)
pH: 5 (ref 5.0–8.0)

## 2018-05-06 LAB — SEDIMENTATION RATE
SED RATE: 57 mm/h — AB (ref 0–22)
SED RATE: 72 mm/h — AB (ref 0–22)

## 2018-05-06 LAB — COMPREHENSIVE METABOLIC PANEL
ALBUMIN: 3 g/dL — AB (ref 3.5–5.0)
ALT: 12 U/L (ref 0–44)
ANION GAP: 10 (ref 5–15)
AST: 14 U/L — ABNORMAL LOW (ref 15–41)
Alkaline Phosphatase: 81 U/L (ref 38–126)
BUN: 21 mg/dL (ref 8–23)
CALCIUM: 9.2 mg/dL (ref 8.9–10.3)
CHLORIDE: 106 mmol/L (ref 98–111)
CO2: 23 mmol/L (ref 22–32)
Creatinine, Ser: 1.03 mg/dL — ABNORMAL HIGH (ref 0.44–1.00)
GFR calc non Af Amer: 52 mL/min — ABNORMAL LOW (ref >60)
GLUCOSE: 146 mg/dL — AB (ref 70–99)
Potassium: 4.2 mmol/L (ref 3.5–5.1)
SODIUM: 139 mmol/L (ref 135–145)
Total Bilirubin: 0.5 mg/dL (ref 0.3–1.2)
Total Protein: 7.2 g/dL (ref 6.5–8.1)

## 2018-05-06 LAB — I-STAT CG4 LACTIC ACID, ED: LACTIC ACID, VENOUS: 0.94 mmol/L (ref 0.5–1.9)

## 2018-05-06 LAB — I-STAT TROPONIN, ED
TROPONIN I, POC: 0.01 ng/mL (ref 0.00–0.08)
Troponin i, poc: 0 ng/mL (ref 0.00–0.08)

## 2018-05-06 LAB — RESPIRATORY PANEL BY PCR
ADENOVIRUS-RVPPCR: NOT DETECTED
Bordetella pertussis: NOT DETECTED
CORONAVIRUS NL63-RVPPCR: NOT DETECTED
CORONAVIRUS OC43-RVPPCR: NOT DETECTED
Chlamydophila pneumoniae: NOT DETECTED
Coronavirus 229E: NOT DETECTED
Coronavirus HKU1: NOT DETECTED
Influenza A: NOT DETECTED
Influenza B: NOT DETECTED
METAPNEUMOVIRUS-RVPPCR: NOT DETECTED
Mycoplasma pneumoniae: NOT DETECTED
PARAINFLUENZA VIRUS 1-RVPPCR: NOT DETECTED
PARAINFLUENZA VIRUS 2-RVPPCR: NOT DETECTED
Parainfluenza Virus 3: NOT DETECTED
Parainfluenza Virus 4: NOT DETECTED
Respiratory Syncytial Virus: NOT DETECTED
Rhinovirus / Enterovirus: NOT DETECTED

## 2018-05-06 LAB — I-STAT VENOUS BLOOD GAS, ED
BICARBONATE: 24.9 mmol/L (ref 20.0–28.0)
O2 Saturation: 89 %
PCO2 VEN: 40.8 mmHg — AB (ref 44.0–60.0)
PH VEN: 7.394 (ref 7.250–7.430)
TCO2: 26 mmol/L (ref 22–32)
pO2, Ven: 57 mmHg — ABNORMAL HIGH (ref 32.0–45.0)

## 2018-05-06 LAB — C-REACTIVE PROTEIN: CRP: 14.7 mg/dL — AB (ref <1.0)

## 2018-05-06 LAB — ECHOCARDIOGRAM LIMITED
Height: 59 in
Weight: 2874.8 oz

## 2018-05-06 LAB — D-DIMER, QUANTITATIVE: D-Dimer, Quant: 4.58 ug/mL-FEU — ABNORMAL HIGH (ref 0.00–0.50)

## 2018-05-06 LAB — TSH: TSH: 1.088 u[IU]/mL (ref 0.350–4.500)

## 2018-05-06 MED ORDER — COLCHICINE 0.6 MG PO TABS
0.6000 mg | ORAL_TABLET | Freq: Two times a day (BID) | ORAL | Status: DC
  Administered 2018-05-06 – 2018-05-07 (×2): 0.6 mg via ORAL
  Filled 2018-05-06 (×2): qty 1

## 2018-05-06 MED ORDER — SODIUM CHLORIDE 0.9% FLUSH
3.0000 mL | Freq: Two times a day (BID) | INTRAVENOUS | Status: DC
  Administered 2018-05-06 – 2018-05-07 (×2): 3 mL via INTRAVENOUS

## 2018-05-06 MED ORDER — IOPAMIDOL (ISOVUE-370) INJECTION 76%
100.0000 mL | Freq: Once | INTRAVENOUS | Status: AC | PRN
  Administered 2018-05-06: 75 mL via INTRAVENOUS

## 2018-05-06 MED ORDER — ACETAMINOPHEN 650 MG RE SUPP: 650.0000 mg | Freq: Four times a day (QID) | RECTAL | Status: DC | PRN

## 2018-05-06 MED ORDER — TEMAZEPAM 7.5 MG PO CAPS
30.0000 mg | ORAL_CAPSULE | Freq: Every evening | ORAL | Status: DC | PRN
  Administered 2018-05-06: 30 mg via ORAL
  Filled 2018-05-06: qty 4

## 2018-05-06 MED ORDER — PANTOPRAZOLE SODIUM 40 MG PO TBEC
40.0000 mg | DELAYED_RELEASE_TABLET | Freq: Every day | ORAL | Status: DC
  Administered 2018-05-07: 40 mg via ORAL
  Filled 2018-05-06: qty 1

## 2018-05-06 MED ORDER — NEBIVOLOL HCL 10 MG PO TABS
20.0000 mg | ORAL_TABLET | Freq: Every day | ORAL | Status: DC
  Administered 2018-05-06 – 2018-05-07 (×2): 20 mg via ORAL
  Filled 2018-05-06 (×2): qty 2

## 2018-05-06 MED ORDER — ENOXAPARIN SODIUM 40 MG/0.4ML ~~LOC~~ SOLN
40.0000 mg | SUBCUTANEOUS | Status: DC
  Administered 2018-05-06: 40 mg via SUBCUTANEOUS
  Filled 2018-05-06: qty 0.4

## 2018-05-06 MED ORDER — VANCOMYCIN HCL IN DEXTROSE 1-5 GM/200ML-% IV SOLN: 1000.0000 mg | Freq: Once | INTRAVENOUS | Status: DC

## 2018-05-06 MED ORDER — ALBUTEROL SULFATE (2.5 MG/3ML) 0.083% IN NEBU
5.0000 mg | INHALATION_SOLUTION | Freq: Once | RESPIRATORY_TRACT | Status: AC
  Administered 2018-05-06: 5 mg via RESPIRATORY_TRACT
  Filled 2018-05-06: qty 6

## 2018-05-06 MED ORDER — VANCOMYCIN HCL IN DEXTROSE 750-5 MG/150ML-% IV SOLN
750.0000 mg | Freq: Two times a day (BID) | INTRAVENOUS | Status: DC
  Administered 2018-05-07: 750 mg via INTRAVENOUS
  Filled 2018-05-06 (×2): qty 150

## 2018-05-06 MED ORDER — TEMAZEPAM 30 MG PO CAPS: 30.0000 mg | ORAL_CAPSULE | Freq: Every evening | ORAL | Status: DC | PRN

## 2018-05-06 MED ORDER — PRAVASTATIN SODIUM 40 MG PO TABS
40.0000 mg | ORAL_TABLET | Freq: Every day | ORAL | Status: DC
  Administered 2018-05-06: 40 mg via ORAL
  Filled 2018-05-06: qty 1

## 2018-05-06 MED ORDER — ACETAMINOPHEN 325 MG PO TABS: 650.0000 mg | ORAL_TABLET | Freq: Four times a day (QID) | ORAL | Status: DC | PRN

## 2018-05-06 MED ORDER — VANCOMYCIN HCL 10 G IV SOLR
1500.0000 mg | Freq: Once | INTRAVENOUS | Status: AC
  Administered 2018-05-06: 1500 mg via INTRAVENOUS
  Filled 2018-05-06: qty 1500

## 2018-05-06 MED ORDER — NAPROXEN 375 MG PO TABS
375.0000 mg | ORAL_TABLET | Freq: Three times a day (TID) | ORAL | Status: DC
  Administered 2018-05-06 – 2018-05-07 (×2): 375 mg via ORAL
  Filled 2018-05-06 (×4): qty 1

## 2018-05-06 MED ORDER — IOPAMIDOL (ISOVUE-370) INJECTION 76%
INTRAVENOUS | Status: AC
  Filled 2018-05-06: qty 100

## 2018-05-06 MED ORDER — SODIUM CHLORIDE 0.9 % IV SOLN
1.0000 g | INTRAVENOUS | Status: DC
  Filled 2018-05-06: qty 1

## 2018-05-06 MED ORDER — SODIUM CHLORIDE 0.9 % IV SOLN
2.0000 g | Freq: Once | INTRAVENOUS | Status: AC
  Administered 2018-05-06: 2 g via INTRAVENOUS
  Filled 2018-05-06: qty 2

## 2018-05-06 MED ORDER — METRONIDAZOLE IN NACL 5-0.79 MG/ML-% IV SOLN
500.0000 mg | Freq: Three times a day (TID) | INTRAVENOUS | Status: DC
  Administered 2018-05-06: 500 mg via INTRAVENOUS
  Filled 2018-05-06: qty 100

## 2018-05-06 MED ORDER — LOSARTAN POTASSIUM 50 MG PO TABS
100.0000 mg | ORAL_TABLET | Freq: Every day | ORAL | Status: DC
  Administered 2018-05-07: 100 mg via ORAL
  Filled 2018-05-06: qty 2

## 2018-05-06 NOTE — Consult Note (Addendum)
Cardiology Consultation:   Patient ID: MAVIS GRAVELLE; [id number]; 02/25/1946   Admit date: 05/06/2018 Date of Consult: 05/06/2018  Primary Care Provider: Bernerd Limbo, MD Primary Cardiologist: Dr Geraldo Pitter 2017 Primary Electrophysiologist:  none   Patient Profile:   Michelle [name] is a 72 y.o. female with a hx of OA, HTN, HLD, GERD, carotid dz, atypical CP, SEM, melanoma, who is being seen today for the evaluation of pericardial effusion at the request of Dr Jeanell Sparrow.  History of Present Illness:   Ms. Behan was evaluated by Dr Geraldo Pitter in 2017 for atypical CP, SEM, HTN w/ MV and echo. Performed, reportedly normal.  HTN has been a problem for years.  Dr Coletta Memos has tried multiple medications, the current regimen has her SBP 160 or so, DBP 80-90.   She was able to walk a mile a day, stopped doing this about a month ago. She has significant DOE all the time. She gets very SOB going up hills. She denies LE edema, no orthopnea or PND.   She has had a runny nose x 6 months or more.   In September, she was at the beach, she woke up in the night w/ pain in her back, chest, arms.   She gets occasional chest pain, with coughing or laying on her L side.   She was otherwise fine until the 27th.  Then developed general malaise, pain in her back, whole chest, neck.  Similar symptoms to what she had at the beach, but they did not resolve.  She also had night sweats.   She went to the urgent care in [facility] on 10/29.  She was told her x-ray showed pneumonia.  She was given an injection of steroids as well as an injection of antibiotics.  She was put on oral antibiotics and some other medication for symptom control.  She initially improved, but then got worse.  She went to the doctor today, and was asked to go to the emergency room.  In the ER, her temperature is 100.9.  She feels her breathing is about at baseline, but just feels very bad and is having a lot of musculoskeletal aches and  pains.  She is not diaphoretic at this time.  She has not had exertional chest pain.  She has not had palpitations.  She has not had presyncope or syncope.  In the ER, her d-dimer was significantly elevated.  A CT angiogram of the chest was performed which was negative for PE, but did show a moderate-large pericardial effusion.  It did not show CHF, pulmonary edema, or pneumonia.   Past Medical History:  Diagnosis Date  . Arthritis    R knee and Left  foot  . Asthma   . Cancer ([place])    Melanoma on back- Stage 1  . Disorders of bursae and tendons in shoulder region, unspecified   . Endometrial hyperplasia, unspecified   . Family history of malignant neoplasm of gastrointestinal tract   . GERD (gastroesophageal reflux disease)    takes Protonix daily  . History of bronchitis 06/2012  . Hyperlipidemia    takes Pravastatin daily  . Hypertension    takes Enalapril daily  . Insomnia    takes Restoril nightly  . Melanoma of skin, site unspecified   . Muscle spasms of head and/or neck    takes Zanaflex daily prn  . Nocturia   . Obesity   . Osteoporosis    takes Fosamax weekly on Sundays  . Other  malaise and fatigue   . Pericardial effusion 05/06/2018  . Peripheral edema    takes Lasix daily  . Pneumonia   . Unspecified hypothyroidism   . Unspecified vitamin D deficiency   . Weakness    numbness,tingling,pain left arm    Past Surgical History:  Procedure Laterality Date  . COLONOSCOPY    . ESOPHAGOGASTRODUODENOSCOPY    . KNEE ARTHROSCOPY  2012  . MELANOMA EXCISION  2006   near scapula  . POSTERIOR CERVICAL FUSION/FORAMINOTOMY Left 12/08/2012   Procedure: Left C/7-T/1 Posterior Cervicothoracic laminotomy/foraminotomy; C/7-T/1 Posterior Cervical Arthrodesis ;  Surgeon: Hosie Spangle, MD;  Location: [facility];  Service: Neurosurgery;  Laterality: Left;  Left cervical seven-thoracic one posterior cervicothoracic laminectomy and foraminotomy, cervical seven-thoracic one  posterior arthrodesis   . ROTATOR CUFF REPAIR  2010   Left  . [place] SURGERY  June/2014  . TOTAL KNEE ARTHROPLASTY  07/07/2011   Procedure: TOTAL KNEE ARTHROPLASTY;  Surgeon: Rudean Haskell, MD;  Location: [facility];  Service: Orthopedics;  Laterality: Right;  . TUBAL LIGATION    . TUMOR EXCISION  2008   endometrial  . TUMOR REMOVAL  2009   From uterus     Prior to Admission medications   Medication Sig Start Date End Date Taking? Authorizing Provider  Ascorbic Acid (VITAMIN C PO) Take 1 tablet by mouth daily.   Yes [provider]  Aspirin (ANACIN PO) Take 2 tablets by mouth daily as needed (for pain).   Yes [provider]  CALCIUM PO Take 1 tablet by mouth daily.   Yes [provider]  cholecalciferol (VITAMIN D) 1000 UNITS tablet Take 1,000 Units by mouth daily.   Yes [provider]  furosemide (LASIX) 40 MG tablet TAKE 1/2 TABLET BY MOUTH DAILY 08/09/13  Yes Norins, Heinz Knuckles, MD  losartan (COZAAR) 100 MG tablet Take 100 mg by mouth daily.   Yes [provider]  MAGNESIUM PO Take 1 capsule by mouth daily.   Yes [provider]  Nebivolol HCl (BYSTOLIC) 20 MG TABS Take 20 mg by mouth daily.   Yes [provider]  omeprazole (PRILOSEC) 40 MG capsule Take 40 mg by mouth daily.   Yes [provider]  pravastatin (PRAVACHOL) 40 MG tablet Take 40 mg by mouth at bedtime.    Yes [provider]  temazepam (RESTORIL) 30 MG capsule Take 1 capsule (30 mg total) by mouth at bedtime as needed for sleep. 10/17/13  Yes Rowe Clack, MD  VITAMIN E PO Take 1 capsule by mouth daily.   Yes [provider]  alendronate (FOSAMAX) 70 MG tablet TAKE 1 TABLET EVERY 7 DAYS WITH A FULL GLASS OF WATER ON AN EMPTY STOMACH. TAKE ON SUNDAY Patient not taking: Reported on 05/06/2018 06/25/13   Neena Rhymes, MD  enalapril (VASOTEC) 20 MG tablet TAKE 1 TABLET (20 MG TOTAL) BY MOUTH DAILY. Patient not taking: Reported on  05/06/2018 08/09/13   Neena Rhymes, MD  oxyCODONE-acetaminophen (PERCOCET/ROXICET) 5-325 MG per tablet Take 1 tablet by mouth daily as needed. Patient not taking: Reported on 05/06/2018 07/21/13   Neena Rhymes, MD  pantoprazole (PROTONIX) 40 MG tablet Take 1 tablet (40 mg total) by mouth daily. Patient not taking: Reported on 05/06/2018 07/24/13 07/24/14  Neena Rhymes, MD    Inpatient Medications: Scheduled Meds: . colchicine  0.6 mg Oral BID  . iopamidol      . naproxen  375 mg Oral TID WC  Continuous Infusions: . [START ON 05/07/2018] ceFEPime (MAXIPIME) IV    . metronidazole 500 mg (05/06/18 1336)  . vancomycin 1,500 mg (05/06/18 1338)  . [START ON 05/07/2018] vancomycin     PRN Meds:   Allergies:   No Known Allergies  Social History:   Social History   Socioeconomic History  . Marital status: Married    Spouse name: Not on file  . Number of children: 4  . Years of education: Not on file  . Highest education level: Not on file  Occupational History  . Occupation: Producer, television/film/video: [employer]  . Financial resource strain: Not on file  . Food insecurity:    Worry: Not on file    Inability: Not on file  . Transportation needs:    Medical: Not on file    Non-medical: Not on file  Tobacco Use  . Smoking status: Never Smoker  Substance and Sexual Activity  . Alcohol use: Yes    Alcohol/week: 4.0 standard drinks    Types: 2 Glasses of wine, 2 Cans of beer per week    Comment: Occassionally  . Drug use: No  . Sexual activity: Yes  Lifestyle  . Physical activity:    Days per week: Not on file    Minutes per session: Not on file  . Stress: Not on file  Relationships  . Social connections:    Talks on phone: Not on file    Gets together: Not on file    Attends religious service: Not on file    Active member of club or organization: Not on file    Attends meetings of clubs or organizations: Not on file    Relationship status: Not on  file  . Intimate partner violence:    Fear of current or ex partner: Not on file    Emotionally abused: Not on file    Physically abused: Not on file    Forced sexual activity: Not on file  Other Topics Concern  . Not on file  Social History Narrative   Married '63; 1 son '63, 3 daughter-'66, '79, '81; grandchildren 7 [18 to 1 year]   Work: did work in [employer]. [employer] - had discussed with spouse and they will go to [email or web address].     Family History:   Family History  Problem Relation Age of Onset  . Emphysema Father        deceased at 61  . Heart failure Father        CHF  . Heart disease Father   . Hypertension Mother        deceased at 22  . Other Mother        DJD  . Heart disease Mother   . Heart disease Sister        deceased  . Arthritis Sister        x2  . Drug abuse Sister        deceased; drug overdose  . Hypertension Sister   . Thyroid disease Sister   . Hypertension Brother   . Hyperlipidemia Brother   . Heart attack Paternal Grandmother   . Heart attack Paternal Uncle   . Coronary artery disease Other        Strong Hx paternal kinship  . Pancreatitis Brother        Died in July 26, 2013  . Breast cancer Neg Hx   . Ovarian cancer Neg Hx   . Colon cancer  Neg Hx   . Colon polyps Neg Hx   . Esophageal cancer Neg Hx    Family Status:  Family Status  Relation Name Status  . Father  (Not Specified)  . Mother  (Not Specified)  . Sister  (Not Specified)  . Sister  (Not Specified)  . Sister  (Not Specified)  . Sister  (Not Specified)  . Sister  (Not Specified)  . Brother  (Not Specified)  . Brother  (Not Specified)  . PGM  (Not Specified)  . Annamarie Major  (Not Specified)  . Other  (Not Specified)  . Brother  (Not Specified)  . Neg Hx  (Not Specified)    ROS:  Please see the history of present illness.  All other ROS reviewed and negative.     Physical Exam/Data:   Vitals:   05/06/18 1345 05/06/18 1400 05/06/18 1415 05/06/18  1445  BP: 135/63 (!) 150/77 137/89 138/87  Pulse: 74 81 84 80  Resp: (!) 21 19 (!) 24 (!) 23  Temp:      TempSrc:      SpO2: 96% 99% 97% 98%  Weight:      Height:       No intake or output data in the 24 hours ending 05/06/18 1520 Filed Weights   05/06/18 1200  Weight: 81.5 kg   Body mass index is 36.29 kg/m.  General:  Well nourished, well developed, in no acute distress HEENT: normal Lymph: no adenopathy Neck: no JVD Endocrine:  No thryomegaly Vascular: No carotid bruits; 4/4 extremity pulses 2+, without bruits  Cardiac:  normal S1, S2; RRR; 2/6 murmur  Lungs: decreased BS bases bilaterally, few scattered rales  Abd: soft, nontender, no hepatomegaly  Ext: no edema Musculoskeletal:  No deformities, BUE and BLE strength normal and equal Skin: warm and dry  Neuro:  CNs 2-12 intact, no focal abnormalities noted Psych:  Normal affect   EKG:  The EKG was personally reviewed and demonstrates: Sinus rhythm, heart rate 77, diffuse ST/T wave changes in multiple leads, different from 2014. Telemetry:  Telemetry was personally reviewed and demonstrates: Sinus rhythm  Relevant CV Studies:  ECHO: Ordered  Laboratory Data:  Chemistry Recent Labs  Lab 05/06/18 1102  NA 139  K 4.2  CL 106  CO2 23  GLUCOSE 146*  BUN 21  CREATININE 1.03*  CALCIUM 9.2  GFRNONAA 52*  GFRAA >60  ANIONGAP 10    Lab Results  Component Value Date   ALT 12 05/06/2018   AST 14 (L) 05/06/2018   ALKPHOS 81 05/06/2018   BILITOT 0.5 05/06/2018   Hematology Recent Labs  Lab 05/06/18 1102  WBC 16.1*  RBC 4.71  HGB 11.7*  HCT 39.7  MCV 84.3  MCH 24.8*  MCHC 29.5*  RDW 14.5  PLT 392   Cardiac EnzymesNo results for input(s): TROPONINI in the last 168 hours.  Recent Labs  Lab 05/06/18 1118 05/06/18 1120  TROPIPOC 0.01 0.00    BNPNo results for input(s): BNP, PROBNP in the last 168 hours.  DDimer  Recent Labs  Lab 05/06/18 1102  DDIMER 4.58*   TSH:  Lab Results  Component  Value Date   TSH 3.95 06/02/2014   Lipids: Lab Results  Component Value Date   CHOL 237 (H) 07/21/2013   HDL 50.00 07/21/2013   LDLCALC 116 (H) 07/07/2012   LDLDIRECT 178.5 07/21/2013   TRIG 82.0 07/21/2013   CHOLHDL 5 07/21/2013     Radiology/Studies:  Ct Angio Chest Pe W  And/or Wo Contrast  Result Date: 05/06/2018 CLINICAL DATA:  Recent pneumonia, now with shortness of breath and weakness EXAM: CT ANGIOGRAPHY CHEST WITH CONTRAST TECHNIQUE: Multidetector CT imaging of the chest was performed using the standard protocol during bolus administration of intravenous contrast. Multiplanar CT image reconstructions and MIPs were obtained to evaluate the vascular anatomy. CONTRAST:  81m ISOVUE-370 IOPAMIDOL (ISOVUE-370) INJECTION 76% COMPARISON:  Portable chest x-ray of 05/06/2018 FINDINGS: Cardiovascular: The pulmonary arteries are well opacified. There is no evidence of acute pulmonary embolism. The thoracic aorta also opacifies with no acute abnormality. Moderate thoracic aortic atherosclerosis is present. The heart is mildly enlarged and there is a moderate-sized pericardial effusion present. Coronary artery calcifications are noted. The mid ascending thoracic aorta measures 33 mm in diameter. Mediastinum/Nodes: No enlarged mediastinal or hilar adenopathy is seen with only small nodes present. The thyroid gland is unremarkable no abnormality of the esophagus is evident by CT. Lungs/Pleura: There is linear atelectasis or scarring in the lingula. However no pneumonia is seen and no pleural effusion is noted. The central airway is patent. Upper Abdomen: On images through the upper abdomen, no significant abnormality is seen. The liver appears slightly prominent perhaps. Musculoskeletal: There is mild thoracic curvature convex to the right. There are degenerative changes in the mid to lower thoracic spine. Review of the MIP images confirms the above findings. IMPRESSION: 1. No evidence of acute pulmonary  embolism. 2. However, there is a moderately large pericardial effusion present and cardiomegaly is noted. Coronary artery calcifications also are present. 3. Mild linear atelectasis in the lingula. No pneumonia or effusion is seen. Electronically Signed   By: PIvar DrapeM.D.   On: 05/06/2018 13:16   Dg Chest Port 1 View  Result Date: 05/06/2018 CLINICAL DATA:  Cough, shortness of breath EXAM: PORTABLE CHEST 1 VIEW COMPARISON:  Chest x-ray 12/06/2012 and 06/26/2011 FINDINGS: On this portable erect view, no pneumonia or effusion is seen. There is a vague nodular opacity at the right lung base which may represent anterior extent of the right fifth rib but attention to this area on follow-up chest x-ray is recommended. Moderate cardiomegaly is stable. Hardware for posterior fusion of the lower cervical spine is present. IMPRESSION: 1. No pneumonia or effusion. 2. Nodular opacity at the right lung base may be due to the anterior right fifth rib, but attention to this area on follow-up chest x-ray is recommended. 3. Stable moderate cardiomegaly Electronically Signed   By: PIvar DrapeM.D.   On: 05/06/2018 11:14    Assessment and Plan:    Active Problems: 1.  Pericardial effusion  - get limited echo today, ordered and called; to make sure no tamponade - start NSAIDS, naprosyn and colchicine - ck for rheum dz, ck TSH will discuss w/ IM. - may be post-viral/infectious  2. HTN - per pt and daughter, hard to control - not sure that has anything to do w/ the effusion - will leave to IM  3. Abnl ECG - ECG reviewed and has diffuse ST/T changes, different from 2019 - may all be due to pericardial effusion, follow - cardiac ez checked and are negative - f/u on echo  Otherwise, per IM   For questions or updates, please contact CCanovanasPlease consult [email or web address] for contact info under Cardiology/STEMI.   Signed, RRosaria Ferries PA-C  05/06/2018 3:20 PM   Patient examined chart reviewed  discussed care with patient and PA. Obese female in no distress Hemodynamics stable Clear lungs no rub trace  edema obese with soft abdomen  JVP hard to evaluate. CT shows moderate pericardial effusion. No clinical signs of tamponade Suspect she has an ongoing viral or rheumatologic issue with the pericardial effusion being a secondary finding Check appropriate labs including ESR, RV, ANA ? Viral titers TSH/T4 Will order echo to further assess effusion. Aorta looks fine on CT and no PE. Start NSAI and colchicine   Jenkins Rouge

## 2018-05-06 NOTE — ED Notes (Signed)
Lab orders pulled off lt a/c by RN, labeled and sent to lab. [employer]

## 2018-05-06 NOTE — H&P (Signed)
Date: 05/06/2018               Patient Name:  Michelle [name] MRN: [id number]  DOB: [birth year] Age / Sex: 72 y.o., female   PCP: Bernerd Limbo, MD         Medical Service: Internal Medicine Teaching Service         Attending Physician: Dr. Evette Doffing, Mallie Mussel, *    First Contact: Dr. Laural Golden Pager: [phone]  Second Contact: Dr. Frederico Hamman Pager: [phone]       After Hours (After 5p/  First Contact Pager: [phone]  weekends / holidays): Second Contact Pager: [phone]   Chief Complaint: Shortness of breath  History of Present Illness: Michelle [name] is a 72 y.o female with a PMHx of HTN, stage one melanoma s/p surgery, and recently diagnosed pneumonia presenting with shortness of breath. She reports a 6 month history of intermittent dyspnea on exertion. She states that during this period of time she had good days where she was able to complete her ADLs and work, but other days she was not able to move around the house without getting dyspneic and diaphoretic. On 10/26 she noticed progressive worsening of dyspnea and went to urgent care on 10/29 for further evaluation. CXR at the time revealed a pneumonia (imaging not in chart) and she was sent home with a course of antibiotic for 1 week. She does not recall which antibiotic. Last dose is scheduled for today. She started feeling better after initiation of antibiotics, but this only lasted 1-2 days. Two days ago, she noticed progressive worsening of dyspnea once again as well as malaise, chills, and night sweats. She denies fevers, chest pain, weight loss, and recent illness or trauma. No sick contacts.  She has a history of Stage I melanoma s/p surgery in 2006, no chemo or radiation. She follows up regularly with dermatology. She endorses back pain at the site of surgery but this is not new. She also has chronic left shoulder pain due to rotator cuff surgery. She also had a right knee replacement many years ago. 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 Lives  at home with husband and is able to drive herself and complete ADLs independently. Social drinking. Never smoker.   In the ED, she was febrile at 100.9 and tachypneic. D-dimer was 4.58. Sed rate 57. CXR showed no pneumonia or effusion and a nodular opacity at the right lung base. CTA showed no evidence of PE, moderately large pericardial effusion and cardiomegaly noted, mild linear atelectasis in the lingula, no pneumonia or effusion. She was given vancomycin, metronidazole and cefepime.   Meds:  Current Meds  Medication Sig  . Ascorbic Acid (VITAMIN C PO) Take 1 tablet by mouth daily.  . Aspirin (ANACIN PO) Take 2 tablets by mouth daily as needed (for pain).  . CALCIUM PO Take 1 tablet by mouth daily.  . cholecalciferol (VITAMIN D) 1000 UNITS tablet Take 1,000 Units by mouth daily.  . furosemide (LASIX) 40 MG tablet TAKE 1/2 TABLET BY MOUTH DAILY  . losartan (COZAAR) 100 MG tablet Take 100 mg by mouth daily.  [name] MAGNESIUM PO Take 1 capsule by mouth daily.  . Nebivolol HCl (BYSTOLIC) 20 MG TABS Take 20 mg by mouth daily.  [name] omeprazole (PRILOSEC) 40 MG capsule Take 40 mg by mouth daily.  . pravastatin (PRAVACHOL) 40 MG tablet Take 40 mg by mouth at bedtime.   . temazepam (RESTORIL) 30 MG capsule Take 1 capsule (30 mg  total) by mouth at bedtime as needed for sleep.  [name] VITAMIN E PO Take 1 capsule by mouth daily.     Allergies: Allergies as of 05/06/2018  . (No Known Allergies)   Past Medical History:  Diagnosis Date  . Arthritis    R knee and Left  foot  . Asthma   . Cancer ([place])    Melanoma on back- Stage 1  . Disorders of bursae and tendons in shoulder region, unspecified   . Endometrial hyperplasia, unspecified   . Family history of malignant neoplasm of gastrointestinal tract   . GERD (gastroesophageal reflux disease)    takes Protonix daily  . History of bronchitis 2012-08-02  . Hyperlipidemia    takes Pravastatin daily  . Hypertension    takes Enalapril daily  . Insomnia     takes Restoril nightly  . Melanoma of skin, site unspecified   . Muscle spasms of head and/or neck    takes Zanaflex daily prn  . Nocturia   . Obesity   . Osteoporosis    takes Fosamax weekly on Sundays  . Other malaise and fatigue   . Pericardial effusion 05/06/2018  . Peripheral edema    takes Lasix daily  . Pneumonia   . Unspecified hypothyroidism   . Unspecified vitamin D deficiency   . Weakness    numbness,tingling,pain left arm    Family History:   sister - MI deceased  Paternal uncle - MI   Family History  Problem Relation Age of Onset  . Emphysema Father        deceased at 64  . Heart failure Father        CHF  . Heart disease Father   . Hypertension Mother        deceased at 20  . Other Mother        DJD  . Heart disease Mother   . Heart disease Sister        deceased  . Arthritis Sister        x2  . Drug abuse Sister        deceased; drug overdose  . Hypertension Sister   . Thyroid disease Sister   . Hypertension Brother   . Hyperlipidemia Brother   . Heart attack Paternal Grandmother   . Heart attack Paternal Uncle   . Coronary artery disease Other        Strong Hx paternal kinship  . Pancreatitis Brother        Died in Aug 02, 2013  . Breast cancer Neg Hx   . Ovarian cancer Neg Hx   . Colon cancer Neg Hx   . Colon polyps Neg Hx   . Esophageal cancer Neg Hx    Social History:   She has a history of Stage I melanoma s/p surgery in 2006, no chemo or radiation. She follows up regularly with dermatology. She endorses back pain at the site of surgery but this is not new. She also has chronic left shoulder pain due to rotator cuff surgery. She also had a right knee replacement many years ago. 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 Lives at home with husband and is able to drive herself and complete ADLs independently. Social drinking. Never smoker.   Review of Systems: A complete ROS was negative except as per HPI.   Physical Exam: Blood pressure (!)  149/79, pulse 82, temperature 99.1 F (37.3 C), temperature source Oral, resp. rate (!) 28, height 4\' 11"  (1.499 m), weight  81.5 kg, SpO2 96 %.  Physical Exam  Constitutional: She is oriented to person, place, and time and well-developed, well-nourished, and in no distress.  Cardiovascular: Normal rate.  Murmur heard. Systolic murmur  Pulmonary/Chest: Effort normal and breath sounds normal. No respiratory distress. She has no wheezes.  Abdominal: Soft. Bowel sounds are normal. She exhibits no distension. There is no tenderness.  Musculoskeletal: She exhibits no edema.  Neurological: She is alert and oriented to person, place, and time.  Skin: Skin is warm and dry.    EKG: personally reviewed my interpretation is sinus rhythm, prolonged QT  CXR: personally reviewed my interpretation is nodular opacity at right lung base, cardiomegaly   CT Angio Chest IMPRESSION: 1. No evidence of acute pulmonary embolism. 2. However, there is a moderately large pericardial effusion present and cardiomegaly is noted. Coronary artery calcifications also are present. 3. Mild linear atelectasis in the lingula. No pneumonia or effusion is seen.  Assessment & Plan by Problem: Active Problems:   Pericardial effusion   Pericardial effusion without cardiac tamponade  Ms. Colquhoun is a 72 y.o female with a PMHx of HTN, stage one melanoma s/p surgery, and recently diagnosed pneumonia presenting with shortness of breath x 6 months that acutely worsened 1 week ago and found to have a moderate to severe pericardial effusion on CTA chest.   Pericardial effusion  Suspect this is chronic as patient is hemodynamically stable without signs of tamponade on exam or echocardiogram. Unclear etiology, possibly infectious in the setting of recent pneumonia and ongoing fever + leukocytosis. Started on vancomycin and cefepime.  She has a history of stage I melanoma s/p excision in 2006, low suspicion for malignant effusion at  this time but would need cytology to rule this out.  Cardiology consulted in the ED and recommending autoimmune workup. No need for immediate drainage at this time. However, may require sampling of effusion if no clear etiology identified.   HTN - continue losartan 100 mg and nebivolol 20 qd  HLD - continue pravastatin 40 mg   Dispo: Admit patient to Inpatient with expected length of stay greater than 2 midnights.  SignedMike Craze, DO 05/06/2018, 5:36 PM  Pager: [phone]

## 2018-05-06 NOTE — Progress Notes (Signed)
Pharmacy Antibiotic Note  Michelle [name] is a 72 y.o. female admitted on 05/06/2018 with sepsis.  Pharmacy has been consulted for vancomycin and cefepime dosing. Pt with Tmax 100.9 and WBC is elevated at 16.1. SCr is WNL at 1.03.   Plan: Vancomycin 1500mg  IV x 1 then 750mg  IV Q12H Cefepime 2gm IV x 1 then 1gm IV Q24H F/u renal fxn, C&S, clinical status and trough at SS  Height: 4\' 11"  (149.9 cm) Weight: 179 lb 10.8 oz (81.5 kg) IBW/kg (Calculated) : 43.2  Temp (24hrs), Avg:100.9 F (38.3 C), Min:100.9 F (38.3 C), Max:100.9 F (38.3 C)  Recent Labs  Lab 05/06/18 1102  WBC 16.1*  CREATININE 1.03*    Estimated Creatinine Clearance: 44.3 mL/min (A) (by C-G formula based on SCr of 1.03 mg/dL (H)).    No Known Allergies  Antimicrobials this admission: Vanc 11/7>> Cefepime 11/7>> Flagyl 11/7>>  Dose adjustments this admission: N/A  Microbiology results: Pending  Thank you for allowing pharmacy to be a part of this patient's care.  Kynzli Rease, Rande Lawman 05/06/2018 12:34 PM

## 2018-05-06 NOTE — ED Notes (Signed)
Pt placed on 2L [place] for comfort per instruction by MD Ray

## 2018-05-06 NOTE — ED Notes (Signed)
Pt ambulatory to bathroom

## 2018-05-06 NOTE — ED Provider Notes (Signed)
[facility] EMERGENCY DEPARTMENT Provider Note   CSN: [id number] Arrival date & time: 05/06/18  1038     History   Chief Complaint Chief Complaint  Patient presents with  . Shortness of Breath    HPI Michelle [name] is a 72 y.o. female.  HPI   72 year old female history of melanoma, cancer, asthma, GERD, hyperlipidemia, hypertension presents today with increasing cough and dyspnea.  She reports that she was seen her primary care office on October 29 and diagnosed with pneumonia Michelle [name].  She reports that she had an x-Michelle [name] performed and received 2 antibiotic shots.  She reports that she was treated with outpatient antibiotics and is on the last day of these today.  She went to her primary care doctor today for recheck with increasing cough and dyspnea.  She reports they took an x-Michelle [name] and said that her pneumonia looked better and she needed to come to the ED.  She had a CBC done there that showed a white blood cell count of 17,000.  She has not had definitive fever or chills.  She reports she has not been taking her temperature at home.  She initially had some runny nose that preceded the cough.  She has had her flu shot this year year.  She has had some decreased appetite but reports no vomiting or diarrhea. Past Medical History:  Diagnosis Date  . Arthritis    R knee and Left  foot  . Asthma   . Cancer ([place])    Melanoma on back- Stage 1  . Disorders of bursae and tendons in shoulder region, unspecified   . Endometrial hyperplasia, unspecified   . Family history of malignant neoplasm of gastrointestinal tract   . GERD (gastroesophageal reflux disease)    takes Protonix daily  . History of bronchitis 06/2012  . Hyperlipidemia    takes Pravastatin daily  . Hypertension    takes Enalapril daily  . Insomnia    takes Restoril nightly  . Melanoma of skin, site unspecified   . Muscle spasms of head and/or neck    takes Zanaflex daily prn  . Nocturia   . Obesity     . Osteoporosis    takes Fosamax weekly on Sundays  . Other and unspecified hyperlipidemia   . Other malaise and fatigue   . Peripheral edema    takes Lasix daily  . Pneumonia   . Unspecified hypothyroidism   . Unspecified vitamin D deficiency   . Weakness    numbness,tingling,pain left arm    Patient Active Problem List   Diagnosis Date Noted  . Obesity, Class I, BMI 30-34.9 07/24/2013  . Left cervical radiculopathy 11/11/2012  . Anemia 08/05/2011  . Routine health maintenance 08/05/2011  . CAROTID ARTERY DISEASE 01/08/2010  . MYALGIA 08/07/2009  . MELANOMA 05/21/2009  . VITAMIN D DEFICIENCY 05/21/2009  . ENDOMETRIAL HYPERPLASIA UNSPECIFIED 05/21/2009  . ROTATOR CUFF INJURY, LEFT SHOULDER 05/21/2009  . HYPOTHYROIDISM 07/27/2007  . HYPERLIPIDEMIA 07/27/2007  . HYPERTENSION 07/27/2007  . GERD 07/27/2007    Past Surgical History:  Procedure Laterality Date  . COLONOSCOPY    . ESOPHAGOGASTRODUODENOSCOPY    . KNEE ARTHROSCOPY  2012  . MELANOMA EXCISION  2006   near scapula  . POSTERIOR CERVICAL FUSION/FORAMINOTOMY Left 12/08/2012   Procedure: Left C/7-T/1 Posterior Cervicothoracic laminotomy/foraminotomy; C/7-T/1 Posterior Cervical Arthrodesis ;  Surgeon: Hosie Spangle, MD;  Location: [facility];  Service: Neurosurgery;  Laterality: Left;  Left cervical seven-thoracic one  posterior cervicothoracic laminectomy and foraminotomy, cervical seven-thoracic one posterior arthrodesis   . ROTATOR CUFF REPAIR  2010   Left  . [place] SURGERY  June/2014  . TOTAL KNEE ARTHROPLASTY  07/07/2011   Procedure: TOTAL KNEE ARTHROPLASTY;  Surgeon: Rudean Haskell, MD;  Location: [facility];  Service: Orthopedics;  Laterality: Right;  . TUBAL LIGATION    . TUMOR EXCISION  2008   endometrial  . TUMOR REMOVAL  2009   From uterus     OB History   None      Home Medications    Prior to Admission medications   Medication Sig Start Date End Date Taking? Authorizing Provider  alendronate  (FOSAMAX) 70 MG tablet TAKE 1 TABLET EVERY 7 DAYS WITH A FULL GLASS OF WATER ON AN EMPTY STOMACH. TAKE ON SUNDAY 06/25/13   Norins, Heinz Knuckles, MD  Ascorbic Acid (VITAMIN C PO) Take 1 tablet by mouth daily.    [provider]  Aspirin (ANACIN PO) Take 2 tablets by mouth daily as needed (for pain).    [provider]  Calcium Citrate-Vitamin D (CALCIUM CITRATE + D PO) Take 1 tablet by mouth daily.      [provider]  cholecalciferol (VITAMIN D) 1000 UNITS tablet Take 1,000 Units by mouth daily.    [provider]  enalapril (VASOTEC) 20 MG tablet TAKE 1 TABLET (20 MG TOTAL) BY MOUTH DAILY. 08/09/13   Norins, Heinz Knuckles, MD  furosemide (LASIX) 40 MG tablet TAKE 1/2 TABLET BY MOUTH DAILY 08/09/13   Norins, Heinz Knuckles, MD  Magnesium-Zinc (MAGNESIUM-CHELATED ZINC PO) Take 1 tablet by mouth 3 (three) times daily.    [provider]  Multiple Vitamin (MULTIVITAMIN WITH MINERALS) TABS Take 1 tablet by mouth daily.    [provider]  oxyCODONE-acetaminophen (PERCOCET/ROXICET) 5-325 MG per tablet Take 1 tablet by mouth daily as needed. Patient taking differently: Take 1 tablet by mouth daily as needed. Pt takes 1/2 tablet, by mouth, as needed 07/21/13   Norins, Heinz Knuckles, MD  pantoprazole (PROTONIX) 40 MG tablet Take 1 tablet (40 mg total) by mouth daily. 07/24/13 07/24/14  Norins, Heinz Knuckles, MD  pravastatin (PRAVACHOL) 40 MG tablet Take 40 mg by mouth daily.    [provider]  temazepam (RESTORIL) 30 MG capsule Take 1 capsule (30 mg total) by mouth at bedtime as needed for sleep. 10/17/13   Rowe Clack, MD    Family History Family History  Problem Relation Age of Onset  . Emphysema Father        deceased at 51  . Heart failure Father        CHF  . Heart disease Father   . Hypertension Mother        deceased at 19  . Other Mother        DJD  . Heart disease Mother   . Heart disease Sister        deceased  . Arthritis Sister         x2  . Drug abuse Sister        deceased; drug overdose  . Hypertension Sister   . Thyroid disease Sister   . Hypertension Brother   . Hyperlipidemia Brother   . Heart attack Paternal Grandmother   . Heart attack Paternal Uncle   . Coronary artery disease Other        Strong Hx paternal kinship  . Pancreatitis Brother        Died in  Jan. 2015  . Breast cancer Neg Hx   . Ovarian cancer Neg Hx   . Colon cancer Neg Hx   . Colon polyps Neg Hx   . Esophageal cancer Neg Hx     Social History Social History   Tobacco Use  . Smoking status: Never Smoker  Substance Use Topics  . Alcohol use: Yes    Alcohol/week: 4.0 standard drinks    Types: 2 Glasses of wine, 2 Cans of beer per week    Comment: Occassionally  . Drug use: No     Allergies   Patient has no known allergies.   Review of Systems Review of Systems  All other systems reviewed and are negative.    Physical Exam Updated Vital Signs There were no vitals taken for this visit.  Physical Exam  Constitutional: She is oriented to person, place, and time. She appears well-developed and well-nourished.  HENT:  Head: Normocephalic and atraumatic.  Mouth/Throat: Oropharynx is clear and moist.  Eyes: Pupils are equal, round, and reactive to light. EOM are normal.  Neck: Normal range of motion. Neck supple.  Cardiovascular: Normal rate and regular rhythm.  Pulmonary/Chest: Effort normal. No respiratory distress. She has rhonchi in the right lower field and the left lower field.  Scattered rhonchi  Abdominal: Soft. Bowel sounds are normal.  Musculoskeletal: Normal range of motion.       Right lower leg: Normal.       Left lower leg: Normal.  Neurological: She is alert and oriented to person, place, and time.  Skin: Skin is warm and dry. Capillary refill takes less than 2 seconds.  Nursing note and vitals reviewed.    ED Treatments / Results  Labs (all labs ordered are listed, but only abnormal results are  displayed) Labs Reviewed - No data to display  EKG EKG Interpretation  Date/Time:  Thursday May 06 2018 10:54:13 EST Ventricular Rate:  77 PR Interval:    QRS Duration: 77 QT Interval:  472 QTC Calculation: 535 R Axis:   60 Text Interpretation:  Normal sinus rhythm T wave inversion v4-v6 and III and avF New since previous tracing Confirmed by Pattricia Boss [id number]) on 05/06/2018 1:37:40 PM   Radiology Ct Angio Chest Pe W And/or Wo Contrast  Result Date: 05/06/2018 CLINICAL DATA:  Recent pneumonia, now with shortness of breath and weakness EXAM: CT ANGIOGRAPHY CHEST WITH CONTRAST TECHNIQUE: Multidetector CT imaging of the chest was performed using the standard protocol during bolus administration of intravenous contrast. Multiplanar CT image reconstructions and MIPs were obtained to evaluate the vascular anatomy. CONTRAST:  33mL ISOVUE-370 IOPAMIDOL (ISOVUE-370) INJECTION 76% COMPARISON:  Portable chest x-Cynai Skeens of 05/06/2018 FINDINGS: Cardiovascular: The pulmonary arteries are well opacified. There is no evidence of acute pulmonary embolism. The thoracic aorta also opacifies with no acute abnormality. Moderate thoracic aortic atherosclerosis is present. The heart is mildly enlarged and there is a moderate-sized pericardial effusion present. Coronary artery calcifications are noted. The mid ascending thoracic aorta measures 33 mm in diameter. Mediastinum/Nodes: No enlarged mediastinal or hilar adenopathy is seen with only small nodes present. The thyroid gland is unremarkable no abnormality of the esophagus is evident by CT. Lungs/Pleura: There is linear atelectasis or scarring in the lingula. However no pneumonia is seen and no pleural effusion is noted. The central airway is patent. Upper Abdomen: On images through the upper abdomen, no significant abnormality is seen. The liver appears slightly prominent perhaps. Musculoskeletal: There is mild thoracic curvature convex to the right.  There are  degenerative changes in the mid to lower thoracic spine. Review of the MIP images confirms the above findings. IMPRESSION: 1. No evidence of acute pulmonary embolism. 2. However, there is a moderately large pericardial effusion present and cardiomegaly is noted. Coronary artery calcifications also are present. 3. Mild linear atelectasis in the lingula. No pneumonia or effusion is seen. Electronically Signed   By: Ivar Drape M.D.   On: 05/06/2018 13:16   Dg Chest Port 1 View  Result Date: 05/06/2018 CLINICAL DATA:  Cough, shortness of breath EXAM: PORTABLE CHEST 1 VIEW COMPARISON:  Chest x-Izadora Roehr 12/06/2012 and 06/26/2011 FINDINGS: On this portable erect view, no pneumonia or effusion is seen. There is a vague nodular opacity at the right lung base which may represent anterior extent of the right fifth rib but attention to this area on follow-up chest x-Gregrey Bloyd is recommended. Moderate cardiomegaly is stable. Hardware for posterior fusion of the lower cervical spine is present. IMPRESSION: 1. No pneumonia or effusion. 2. Nodular opacity at the right lung base may be due to the anterior right fifth rib, but attention to this area on follow-up chest x-Kayhan Boardley is recommended. 3. Stable moderate cardiomegaly Electronically Signed   By: Ivar Drape M.D.   On: 05/06/2018 11:14    Procedures Procedures (including critical care time)  Medications Ordered in ED Medications - No data to display   Initial Impression / Assessment and Plan / ED Course  I have reviewed the triage vital signs and the nursing notes.  Pertinent labs & imaging results that were available during my care of the patient were reviewed by me and considered in my medical decision making (see chart for details).    72 year old female presents today with cough, fever, and generalized weakness.  She has been treated for pneumonia and is on the final day of treatment. Evaluation here revealed tachypnea with elevated d-dimer.  CT angios of lungs  reveals no evidence of infiltrate or pulmonary embolism, but does reveal pericardial effusion.  Vitals:   05/06/18 1215 05/06/18 1245  BP: 118/77 (!) 141/73  Pulse: 73 86  Resp: (!) 25 (!) 30  Temp:    SpO2: 98% 99%    Discussed with cardiology and will see in evaluation Discussed with Dr. Heber [place], on-call for internal medicine and they will see patient for evaluation and admission Final Clinical Impressions(s) / ED Diagnoses   Final diagnoses:  Pericardial effusion  Dyspnea, unspecified type  Febrile illness    ED Discharge Orders    None       Pattricia Boss, MD 05/06/18 1617

## 2018-05-06 NOTE — Progress Notes (Signed)
  Echocardiogram 2D Echocardiogram has been performed.  Uchechukwu Dhawan G Joriel Streety 05/06/2018, 4:56 PM

## 2018-05-06 NOTE — Plan of Care (Signed)
  Problem: Education: Goal: Knowledge of General Education information will improve Description Including pain rating scale, medication(s)/side effects and non-pharmacologic comfort measures Outcome: Progressing   Problem: Health Behavior/Discharge Planning: Goal: Ability to manage health-related needs will improve Outcome: Progressing   Problem: Clinical Measurements: Goal: Will remain free from infection Outcome: Progressing   Problem: Nutrition: Goal: Adequate nutrition will be maintained Outcome: Progressing   Problem: Elimination: Goal: Will not experience complications related to bowel motility Outcome: Progressing Goal: Will not experience complications related to urinary retention Outcome: Progressing

## 2018-05-07 ENCOUNTER — Other Ambulatory Visit: Payer: Self-pay | Admitting: Internal Medicine

## 2018-05-07 ENCOUNTER — Other Ambulatory Visit: Payer: Self-pay

## 2018-05-07 ENCOUNTER — Encounter (HOSPITAL_COMMUNITY): Payer: Self-pay

## 2018-05-07 DIAGNOSIS — M159 Polyosteoarthritis, unspecified: Secondary | ICD-10-CM | POA: Diagnosis not present

## 2018-05-07 DIAGNOSIS — D649 Anemia, unspecified: Secondary | ICD-10-CM | POA: Diagnosis not present

## 2018-05-07 DIAGNOSIS — R509 Fever, unspecified: Secondary | ICD-10-CM | POA: Diagnosis not present

## 2018-05-07 DIAGNOSIS — I313 Pericardial effusion (noninflammatory): Secondary | ICD-10-CM | POA: Diagnosis not present

## 2018-05-07 DIAGNOSIS — R0682 Tachypnea, not elsewhere classified: Secondary | ICD-10-CM | POA: Diagnosis not present

## 2018-05-07 DIAGNOSIS — I10 Essential (primary) hypertension: Secondary | ICD-10-CM | POA: Diagnosis not present

## 2018-05-07 LAB — CBC
HCT: 33.2 % — ABNORMAL LOW (ref 36.0–46.0)
Hemoglobin: 9.9 g/dL — ABNORMAL LOW (ref 12.0–15.0)
MCH: 25.4 pg — ABNORMAL LOW (ref 26.0–34.0)
MCHC: 29.8 g/dL — ABNORMAL LOW (ref 30.0–36.0)
MCV: 85.1 fL (ref 80.0–100.0)
NRBC: 0 % (ref 0.0–0.2)
PLATELETS: 338 10*3/uL (ref 150–400)
RBC: 3.9 MIL/uL (ref 3.87–5.11)
RDW: 14.6 % (ref 11.5–15.5)
WBC: 12.8 10*3/uL — ABNORMAL HIGH (ref 4.0–10.5)

## 2018-05-07 LAB — BASIC METABOLIC PANEL
ANION GAP: 4 — AB (ref 5–15)
BUN: 17 mg/dL (ref 8–23)
CALCIUM: 8.8 mg/dL — AB (ref 8.9–10.3)
CO2: 26 mmol/L (ref 22–32)
Chloride: 109 mmol/L (ref 98–111)
Creatinine, Ser: 0.95 mg/dL (ref 0.44–1.00)
GFR calc Af Amer: >60 mL/min (ref >60)
GFR, EST NON AFRICAN AMERICAN: 58 mL/min — AB (ref >60)
GLUCOSE: 128 mg/dL — AB (ref 70–99)
Potassium: 4.1 mmol/L (ref 3.5–5.1)
Sodium: 139 mmol/L (ref 135–145)

## 2018-05-07 LAB — ANA W/REFLEX IF POSITIVE: Anti Nuclear Antibody(ANA): NEGATIVE

## 2018-05-07 LAB — MRSA PCR SCREENING: MRSA by PCR: NEGATIVE

## 2018-05-07 MED ORDER — COLCHICINE 0.6 MG PO TABS: 0.6000 mg | ORAL_TABLET | Freq: Two times a day (BID) | ORAL | 0 refills | Status: DC

## 2018-05-07 MED ORDER — NAPROXEN 375 MG PO TABS: 375.0000 mg | ORAL_TABLET | Freq: Three times a day (TID) | ORAL | 0 refills | Status: AC

## 2018-05-07 NOTE — Discharge Summary (Addendum)
Name: Michelle [name] MRN: [id number] DOB: 12/29/1945 72 y.o. PCP: Bernerd Limbo, MD  Date of Admission: 05/06/2018 10:39 AM Date of Discharge: 05/07/2018 Attending Physician: Axel Filler, *  Discharge Diagnosis: 1. Pericardial Effusion  Discharge Medications: Allergies as of 05/07/2018   No Known Allergies     Medication List    TAKE these medications   alendronate 70 MG tablet Commonly known as:  FOSAMAX TAKE 1 TABLET EVERY 7 DAYS WITH A FULL GLASS OF WATER ON AN EMPTY STOMACH. TAKE ON SUNDAY   ANACIN PO Take 2 tablets by mouth daily as needed (for pain).   BYSTOLIC 20 MG Tabs Generic drug:  Nebivolol HCl Take 20 mg by mouth daily.   CALCIUM PO Take 1 tablet by mouth daily.   cholecalciferol 1000 units tablet Commonly known as:  VITAMIN D Take 1,000 Units by mouth daily.   colchicine 0.6 MG tablet Take 1 tablet (0.6 mg total) by mouth 2 (two) times daily for 7 days.   enalapril 20 MG tablet Commonly known as:  VASOTEC TAKE 1 TABLET (20 MG TOTAL) BY MOUTH DAILY.   furosemide 40 MG tablet Commonly known as:  LASIX TAKE 1/2 TABLET BY MOUTH DAILY   losartan 100 MG tablet Commonly known as:  COZAAR Take 100 mg by mouth daily.   MAGNESIUM PO Take 1 capsule by mouth daily.   naproxen 375 MG tablet Commonly known as:  NAPROSYN Take 1 tablet (375 mg total) by mouth 3 (three) times daily with meals for 7 days.   omeprazole 40 MG capsule Commonly known as:  PRILOSEC Take 40 mg by mouth daily.   oxyCODONE-acetaminophen 5-325 MG tablet Commonly known as:  PERCOCET/ROXICET Take 1 tablet by mouth daily as needed.   pantoprazole 40 MG tablet Commonly known as:  PROTONIX Take 1 tablet (40 mg total) by mouth daily.   pravastatin 40 MG tablet Commonly known as:  PRAVACHOL Take 40 mg by mouth at bedtime.   temazepam 30 MG capsule Commonly known as:  RESTORIL Take 1 capsule (30 mg total) by mouth at bedtime as needed for sleep.   VITAMIN C PO Take  1 tablet by mouth daily.   VITAMIN E PO Take 1 capsule by mouth daily.       Disposition and follow-up:   Ms.Michelle [name] was discharged from [facility] in Stable condition.  At the hospital follow up visit please address:  1.  Pericardial Effusion- patient will need a follow up TTE in ~6 weeks, prescribed 1 week of colchicine and naprosyn  2. Anemia- patient's Hgb noted to drop from 11.7 to 9.9 during hospital stay with no s/sx of bleeding, recommend repeating CBC  3.  Labs / imaging needed at time of follow-up: cbc  4.  Pending labs/ test needing follow-up: ANA   Follow-up Appointments:   Patient to follow up with cardiology in approximately 6 weeks. Recommended she follow up with PCP in 1 week.  Hospital Course by problem list: 1. Pericardial Effusion- patient presented with a 6 month history of shortness of breath that progressed over the past week. She reports a 6 month history of intermittent dyspnea on exertion. She states that during this period of time she had good days where she was able to complete her ADLs and work, but other days she was not able to move around the house without getting dyspneic and diaphoretic. On 10/26 she noticed progressive worsening of dyspnea and went to urgent care on  10/29 for further evaluation. CXR at the time revealed a pneumonia (imaging not in chart) and she was sent home with a course of antibiotic for 1 week. She started feeling better initially then she noticed progressive worsening of dyspnea once again as well as malaise, chills, and night sweats. On arrival to the ED, she was febrile at 100.9 and tachypneic. D-dimer was 4.58. Sed rate 57. CXR showed no pneumonia or effusion and a nodular opacity at the right lung base. CTA showed no evidence of PE, moderately large pericardial effusion and cardiomegaly noted, mild linear atelectasis in the lingula, no pneumonia or effusion. She was given vancomycin, metronidazole and cefepime.  Cardiology evaluated the patient and did not recommend any surgical intervention at this time. Most likely due to post-viral inflammation. She was treated with colchicine and naprosyn, with plans to continue those for 1 week and follow up with cardiology for a follow up TTE in ~6 weeks. She improved significantly overnight and was able to ambulate longer distances with no SOB. Discharged with 1 week of colchicine 0.6 mg bid and naprosyn 375 mg TID.    Discharge Vitals:   BP 114/75 (BP Location: Left Arm)   Pulse 78   Temp 97.7 F (36.5 C) (Oral)   Resp 16   Ht 4\' 11"  (1.499 m)   Wt 79.9 kg   SpO2 98%   BMI 35.59 kg/m   Pertinent Labs, Studies, and Procedures:   CBC Latest Ref Rng & Units 05/07/2018 05/06/2018 06/02/2014  WBC 4.0 - 10.5 K/uL 12.8(H) 16.1(H) 9.7  Hemoglobin 12.0 - 15.0 g/dL 9.9(L) 11.7(L) 13.4  Hematocrit 36.0 - 46.0 % 33.2(L) 39.7 41.6  Platelets 150 - 400 K/uL 338 392 256.0   CMP Latest Ref Rng & Units 05/07/2018 05/06/2018 07/21/2013  Glucose 70 - 99 mg/dL 128(H) 146(H) -  BUN 8 - 23 mg/dL 17 21 -  Creatinine 0.44 - 1.00 mg/dL 0.95 1.03(H) -  Sodium 135 - 145 mmol/L 139 139 -  Potassium 3.5 - 5.1 mmol/L 4.1 4.2 -  Chloride 98 - 111 mmol/L 109 106 -  CO2 22 - 32 mmol/L 26 23 -  Calcium 8.9 - 10.3 mg/dL 8.8(L) 9.2 -  Total Protein 6.5 - 8.1 g/dL - 7.2 7.9  Total Bilirubin 0.3 - 1.2 mg/dL - 0.5 0.8  Alkaline Phos 38 - 126 U/L - 81 74  AST 15 - 41 U/L - 14(L) 19  ALT 0 - 44 U/L - 12 26    Ct Angio Chest Pe W And/or Wo Contrast  Result Date: 05/06/2018 CLINICAL DATA:  Recent pneumonia, now with shortness of breath and weakness EXAM: CT ANGIOGRAPHY CHEST WITH CONTRAST TECHNIQUE: Multidetector CT imaging of the chest was performed using the standard protocol during bolus administration of intravenous contrast. Multiplanar CT image reconstructions and MIPs were obtained to evaluate the vascular anatomy. CONTRAST:  36mL ISOVUE-370 IOPAMIDOL (ISOVUE-370) INJECTION 76%  COMPARISON:  Portable chest x-ray of 05/06/2018 FINDINGS: Cardiovascular: The pulmonary arteries are well opacified. There is no evidence of acute pulmonary embolism. The thoracic aorta also opacifies with no acute abnormality. Moderate thoracic aortic atherosclerosis is present. The heart is mildly enlarged and there is a moderate-sized pericardial effusion present. Coronary artery calcifications are noted. The mid ascending thoracic aorta measures 33 mm in diameter. Mediastinum/Nodes: No enlarged mediastinal or hilar adenopathy is seen with only small nodes present. The thyroid gland is unremarkable no abnormality of the esophagus is evident by CT. Lungs/Pleura: There is linear atelectasis or scarring  in the lingula. However no pneumonia is seen and no pleural effusion is noted. The central airway is patent. Upper Abdomen: On images through the upper abdomen, no significant abnormality is seen. The liver appears slightly prominent perhaps. Musculoskeletal: There is mild thoracic curvature convex to the right. There are degenerative changes in the mid to lower thoracic spine. Review of the MIP images confirms the above findings. IMPRESSION: 1. No evidence of acute pulmonary embolism. 2. However, there is a moderately large pericardial effusion present and cardiomegaly is noted. Coronary artery calcifications also are present. 3. Mild linear atelectasis in the lingula. No pneumonia or effusion is seen. Electronically Signed   By: Ivar Drape M.D.   On: 05/06/2018 13:16   Dg Chest Port 1 View  Result Date: 05/06/2018 CLINICAL DATA:  Cough, shortness of breath EXAM: PORTABLE CHEST 1 VIEW COMPARISON:  Chest x-ray 12/06/2012 and 06/26/2011 FINDINGS: On this portable erect view, no pneumonia or effusion is seen. There is a vague nodular opacity at the right lung base which may represent anterior extent of the right fifth rib but attention to this area on follow-up chest x-ray is recommended. Moderate cardiomegaly is  stable. Hardware for posterior fusion of the lower cervical spine is present. IMPRESSION: 1. No pneumonia or effusion. 2. Nodular opacity at the right lung base may be due to the anterior right fifth rib, but attention to this area on follow-up chest x-ray is recommended. 3. Stable moderate cardiomegaly Electronically Signed   By: Ivar Drape M.D.   On: 05/06/2018 11:14   ECHO Impressions:  - Normal LV EF. A moderate pericardial effusion is noted both   anteriorly and posteriorly, with maximal dimensions measuring <1   cm. There is borderline respiratory flow variation at the mitral   valve. There is no evidence of RV diastolic collapse. While the   IVC is mildly dilated, it does partially collapse with   inspiration. Patient is also not tachycardic during study. These   findings suggest no tamponade, but recommend clinical   correlation.  Discharge Instructions: Discharge Instructions    Diet - low sodium heart healthy   Complete by:  As directed    Discharge instructions   Complete by:  As directed    Ms. Garcialopez,  You were hospitalized due to a pericardial effusion, which is a fluid collection around your heart. It is most likely due to a viral infection. We want you to continue taking Colchicine (0.6 mg) 1 tablet twice a day for one week and Naprosyn (375 mg) 1 tablet three times a day with meals for one week. These medications will help decrease the fluid around your heart.  Please follow up with Cardiology in six weeks for a repeat Ultrasound to see if the fluid has decreased. Also, make sure to follow up with your PCP within a week.  Thank you for allowing [place] to be a part of your care!   Increase activity slowly   Complete by:  As directed       Signed: Mike Craze, DO 05/07/2018, 12:37 PM   Pager: [phone]

## 2018-05-07 NOTE — Progress Notes (Signed)
Progress Note  Patient Name: Michelle [name] Date of Encounter: 05/07/2018  Primary Cardiologist:  Revankar  Subjective   Weak no chest pain no real dyspnea this am   Inpatient Medications    Scheduled Meds: . colchicine  0.6 mg Oral BID  . enoxaparin (LOVENOX) injection  40 mg Subcutaneous Q24H  . losartan  100 mg Oral Daily  . naproxen  375 mg Oral TID WC  . nebivolol  20 mg Oral Daily  . pantoprazole  40 mg Oral Daily  . pravastatin  40 mg Oral QHS  . sodium chloride flush  3 mL Intravenous Q12H   Continuous Infusions: . ceFEPime (MAXIPIME) IV    . vancomycin 750 mg (05/07/18 0057)   PRN Meds: acetaminophen **OR** acetaminophen, temazepam   Vital Signs    Vitals:   05/06/18 1705 05/06/18 2013 05/07/18 0318 05/07/18 0336  BP: (!) 149/79 131/67 114/75   Pulse: 82 78    Resp: (!) 28 16    Temp: 99.1 F (37.3 C) 97.9 F (36.6 C) 97.7 F (36.5 C)   TempSrc: Oral Oral Oral   SpO2: 96% 98%    Weight:    79.9 kg  Height:        Intake/Output Summary (Last 24 hours) at 05/07/2018 0941 Last data filed at 05/07/2018 0357 Gross per 24 hour  Intake 510 ml  Output -  Net 510 ml   Filed Weights   05/06/18 1200 05/07/18 0336  Weight: 81.5 kg 79.9 kg    Telemetry    NSR 05/07/2018  - Personally Reviewed  ECG    NSR nonspecific ST/T wave changes no acute pericarditis  - Personally Reviewed  Physical Exam  Obese white female  GEN: No acute distress.   Neck: No JVD Cardiac: RRR, no murmurs, rubs, or gallops.  Respiratory: Clear to auscultation bilaterally. GI: Soft, nontender, non-distended  MS: No edema; No deformity. Neuro:  Nonfocal  Psych: Normal affect   Labs    Chemistry Recent Labs  Lab 05/06/18 1102 05/07/18 0428  NA 139 139  K 4.2 4.1  CL 106 109  CO2 23 26  GLUCOSE 146* 128*  BUN 21 17  CREATININE 1.03* 0.95  CALCIUM 9.2 8.8*  PROT 7.2  --   ALBUMIN 3.0*  --   AST 14*  --   ALT 12  --   ALKPHOS 81  --   BILITOT 0.5  --     GFRNONAA 52* 58*  GFRAA >60 >60  ANIONGAP 10 4*     Hematology Recent Labs  Lab 05/06/18 1102 05/07/18 0428  WBC 16.1* 12.8*  RBC 4.71 3.90  HGB 11.7* 9.9*  HCT 39.7 33.2*  MCV 84.3 85.1  MCH 24.8* 25.4*  MCHC 29.5* 29.8*  RDW 14.5 14.6  PLT 392 338    Cardiac EnzymesNo results for input(s): TROPONINI in the last 168 hours.  Recent Labs  Lab 05/06/18 1118 05/06/18 1120  TROPIPOC 0.01 0.00     BNPNo results for input(s): BNP, PROBNP in the last 168 hours.   DDimer  Recent Labs  Lab 05/06/18 1102  DDIMER 4.58*     Radiology    Ct Angio Chest Pe W And/or Wo Contrast  Result Date: 05/06/2018 CLINICAL DATA:  Recent pneumonia, now with shortness of breath and weakness EXAM: CT ANGIOGRAPHY CHEST WITH CONTRAST TECHNIQUE: Multidetector CT imaging of the chest was performed using the standard protocol during bolus administration of intravenous contrast. Multiplanar CT image reconstructions and MIPs  were obtained to evaluate the vascular anatomy. CONTRAST:  11m ISOVUE-370 IOPAMIDOL (ISOVUE-370) INJECTION 76% COMPARISON:  Portable chest x-ray of 05/06/2018 FINDINGS: Cardiovascular: The pulmonary arteries are well opacified. There is no evidence of acute pulmonary embolism. The thoracic aorta also opacifies with no acute abnormality. Moderate thoracic aortic atherosclerosis is present. The heart is mildly enlarged and there is a moderate-sized pericardial effusion present. Coronary artery calcifications are noted. The mid ascending thoracic aorta measures 33 mm in diameter. Mediastinum/Nodes: No enlarged mediastinal or hilar adenopathy is seen with only small nodes present. The thyroid gland is unremarkable no abnormality of the esophagus is evident by CT. Lungs/Pleura: There is linear atelectasis or scarring in the lingula. However no pneumonia is seen and no pleural effusion is noted. The central airway is patent. Upper Abdomen: On images through the upper abdomen, no significant  abnormality is seen. The liver appears slightly prominent perhaps. Musculoskeletal: There is mild thoracic curvature convex to the right. There are degenerative changes in the mid to lower thoracic spine. Review of the MIP images confirms the above findings. IMPRESSION: 1. No evidence of acute pulmonary embolism. 2. However, there is a moderately large pericardial effusion present and cardiomegaly is noted. Coronary artery calcifications also are present. 3. Mild linear atelectasis in the lingula. No pneumonia or effusion is seen. Electronically Signed   By: PIvar DrapeM.D.   On: 05/06/2018 13:16   Dg Chest Port 1 View  Result Date: 05/06/2018 CLINICAL DATA:  Cough, shortness of breath EXAM: PORTABLE CHEST 1 VIEW COMPARISON:  Chest x-ray 12/06/2012 and 06/26/2011 FINDINGS: On this portable erect view, no pneumonia or effusion is seen. There is a vague nodular opacity at the right lung base which may represent anterior extent of the right fifth rib but attention to this area on follow-up chest x-ray is recommended. Moderate cardiomegaly is stable. Hardware for posterior fusion of the lower cervical spine is present. IMPRESSION: 1. No pneumonia or effusion. 2. Nodular opacity at the right lung base may be due to the anterior right fifth rib, but attention to this area on follow-up chest x-ray is recommended. 3. Stable moderate cardiomegaly Electronically Signed   By: PIvar DrapeM.D.   On: 05/06/2018 11:14    Cardiac Studies   TTE normal EF moderate pericardial effusion no tamponade  Patient Profile     DGESENIA BANTZis a 72y.o. female with a hx of OA, HTN, HLD, GERD, carotid dz, atypical CP, SEM, melanoma, who is being seen today for the evaluation of pericardial effusion at the request of Dr RJeanell Sparrow  Assessment & Plan    Pericardial Effusion: likely secondary to acute viral infection or rheumatologic problem TSH is normal  ESR elevated 72 C reactive 14 From our perspective continue colchicine and  NSAI's will repeat TTE in about 6 weeks   HTN:  Well controlled.  Continue current medications and low sodium Dash type diet.        For questions or updates, please contact CNorrisPlease consult [email or web address] for contact info under        Signed, PJenkins Rouge MD  05/07/2018, 9:41 AM

## 2018-05-07 NOTE — Evaluation (Signed)
Physical Therapy Evaluation Patient Details Name: Michelle [name] MRN: [id number] DOB: Dec 10, 1945 Today's Date: 05/07/2018   History of Present Illness  72yo female with worsening DOE, diaphoresis. Went to urgent care and was diagnosed with pneumonia, given antibiotics and symptoms improved but then returned; she went back to the same urgent care and they sent her to the ED. CT negative for PE. Admitted for cardiac effusion without tamponade. PMH hypothyroidism, obesity, melanoma, HTN, CA, OA, TKR, spine surgery, RCR, posterior cervical fusion, knee scope   Clinical Impression   Patient received sitting at EOB, very pleasant and willing to participate, family present during session. She is able to complete transfers with Mod(I), and gait approximately 838ft with S without assistive device. SpO2 remained between 95-97% on room air with gait, HR WNL during activity, and patient able to maintain conversation with PT during gait period with mild SOB. Gait mechanics appear WNL, balance appears good but she does demonstrate mild  Unsteadiness with head turns when ambulating, able to self correct well without additional assistance. Educated that she may ambulate in hallway with family or staff present due to high level of mobility/very low fall risk. She will benefit from skilled PT services in the acute setting but do not anticipate need for skilled PT services following DC.     Follow Up Recommendations No PT follow up    Equipment Recommendations  None recommended by PT    Recommendations for Other Services       Precautions / Restrictions Precautions Precautions: None Restrictions Weight Bearing Restrictions: No      Mobility  Bed Mobility               General bed mobility comments: sitting at EOB   Transfers Overall transfer level: Modified independent Equipment used: None             General transfer comment: no cues or physical assist required    Ambulation/Gait Ambulation/Gait assistance: Supervision Gait Distance (Feet): 800 Feet Assistive device: None Gait Pattern/deviations: WFL(Within Functional Limits);Step-through pattern     General Gait Details: gait pattern generally WNL, no concerns for unsteadiness or gait deviation  Stairs            Wheelchair Mobility    Modified Rankin (Stroke Patients Only)       Balance Overall balance assessment: Mild deficits observed, not formally tested                                           Pertinent Vitals/Pain Pain Assessment: No/denies pain    Home Living Family/patient expects to be discharged to:: Private residence Living Arrangements: Spouse/significant other Available Help at Discharge: Family;Available 24 hours/day Type of Home: House Home Access: Stairs to enter Entrance Stairs-Rails: None Entrance Stairs-Number of Steps: 1 Home Layout: Two level;Able to live on main level with bedroom/bathroom Home Equipment: None      Prior Function Level of Independence: Independent               Hand Dominance        Extremity/Trunk Assessment   Upper Extremity Assessment Upper Extremity Assessment: Overall WFL for tasks assessed    Lower Extremity Assessment Lower Extremity Assessment: Overall WFL for tasks assessed    Cervical / Trunk Assessment Cervical / Trunk Assessment: Kyphotic  Communication   Communication: No difficulties  Cognition Arousal/Alertness: Awake/alert Behavior During Therapy: [facility]  for tasks assessed/performed Overall Cognitive Status: Within Functional Limits for tasks assessed                                        General Comments      Exercises     Assessment/Plan    PT Assessment Patient needs continued PT services  PT Problem List Decreased strength;Decreased activity tolerance;Decreased balance;Decreased coordination       PT Treatment Interventions DME  instruction;Balance training;Gait training;Neuromuscular re-education;Stair training;Functional mobility training;Patient/family education;Therapeutic activities;Therapeutic exercise;Manual techniques    PT Goals (Current goals can be found in the Care Plan section)  Acute Rehab PT Goals Patient Stated Goal: to go home, feel better  PT Goal Formulation: With patient/family Time For Goal Achievement: 05/21/18 Potential to Achieve Goals: Good    Frequency Min 3X/week   Barriers to discharge        Co-evaluation               AM-PAC PT "6 Clicks" Daily Activity  Outcome Measure Difficulty turning over in bed (including adjusting bedclothes, sheets and blankets)?: None Difficulty moving from lying on back to sitting on the side of the bed? : None Difficulty sitting down on and standing up from a chair with arms (e.g., wheelchair, bedside commode, etc,.)?: None Help needed moving to and from a bed to chair (including a wheelchair)?: None Help needed walking in hospital room?: None Help needed climbing 3-5 steps with a railing? : A Little 6 Click Score: 23    End of Session   Activity Tolerance: Patient tolerated treatment well Patient left: Other (comment);with family/visitor present;with call bell/phone within reach(standing at sink brushing teeth )   PT Visit Diagnosis: Unsteadiness on feet (R26.81);Other (comment)(reduced functional activity tolerance )    Time: 5374-8270 PT Time Calculation (min) (ACUTE ONLY): 11 min   Charges:   PT Evaluation $PT Eval Low Complexity: 1 Low         Deniece Ree PT, DPT, CBIS  Supplemental Physical Therapist [place]    Pager [phone] Acute Rehab Office [phone]

## 2018-05-07 NOTE — Progress Notes (Signed)
   Subjective: Michelle [name] reported feeling well this morning. She felt her SOB had improved. She walked up and down the hallway with no trouble. She said her cough has increased but denied any phlegm or mucus. She said she was told she had arthritis in the left shoulder and right knee. No history of arthritis when she was young or hand stiffness. She said her whole body aches and feels stiff when she wakes up.    Objective:  Vital signs in last 24 hours: Vitals:   05/06/18 1705 05/06/18 2013 05/07/18 0318 05/07/18 0336  BP: (!) 149/79 131/67 114/75   Pulse: 82 78    Resp: (!) 28 16    Temp: 99.1 F (37.3 C) 97.9 F (36.6 C) 97.7 F (36.5 C)   TempSrc: Oral Oral Oral   SpO2: 96% 98%    Weight:    79.9 kg  Height:       Physical Exam  Constitutional: She is oriented to person, place, and time and well-developed, well-nourished, and in no distress.  Musculoskeletal: She exhibits no edema, tenderness or deformity.  Neurological: She is alert and oriented to person, place, and time.  Skin: Skin is warm and dry.   POCT Cardiac Ultrasound- small pericardial effusion appreciated   Assessment/Plan:  Principal Problem:   Pericardial effusion without cardiac tamponade Active Problems:   Pericardial effusion  Michelle [name] is a 72 y.o female with a PMHx of HTN, stage one melanoma s/p surgery, and recently diagnosed pneumonia presenting with shortness of breath x 6 months that acutely worsened 1 week ago and found to have a moderate to severe pericardial effusion on CTA chest.   Pericardial effusion  Patient is hemodynamically stable. WBC improved to 12.8 from 16.1 on admission. Most likely post-viral etiology, plans to continue colchicine and naprosyn for 1 week and follow up with cardiology for a repeat TTE in ~6 weeks. May require sampling of effusion if this persists.  - Discontinued vancomycin and cefepime - TSH wnl - ANA pending  - Resp panel negative   Anemia Patient's Hgb dropped  from 11.7 to 9.9. No s/sx of bleeding. Would recommend follow up cbc outpatient with pcp.  Dispo: Patient is medically stable for discharge.   Mike Craze, DO 05/07/2018, 12:37 PM Pager: [phone]

## 2018-05-11 LAB — CULTURE, BLOOD (ROUTINE X 2)
CULTURE: NO GROWTH
CULTURE: NO GROWTH
SPECIAL REQUESTS: ADEQUATE

## 2018-05-12 DIAGNOSIS — I313 Pericardial effusion (noninflammatory): Secondary | ICD-10-CM | POA: Diagnosis not present

## 2018-06-04 ENCOUNTER — Telehealth: Payer: Self-pay

## 2018-06-04 NOTE — Telephone Encounter (Signed)
SENT REFERRAL TO SCHEDULING AND FILED NOTES 

## 2018-07-07 NOTE — Progress Notes (Signed)
Cardiology Office Note   Date:  07/12/2018   ID:  Michelle [name], DOB [birth year], MRN [id number]  PCP:  Bernerd Limbo, MD  Cardiologist:   Jenkins Rouge, MD   No chief complaint on file.     History of Present Illness: Michelle [name] is a 73 y.o. female who presents for post hospital f/u pericardial effusion. Seen in hospital consult 05/06/18  History of HTN, OSA, HLD, and m urmur.  Presented with URI , fever with myalgias. CTA no PE but moderate pericardial effusion TTE reviewed from 05/06/18 with EF 60-65% moderate PE with no tamponade ESR elevated 72  C reactive 14.7 ANA negative TSH normal Rx with colchicine and NSAI's  Feels better no dyspnea Is off NSAI's and colchicine     Past Medical History:  Diagnosis Date  . Arthritis    R knee and Left  foot  . Asthma   . Cancer ([place])    Melanoma on back- Stage 1  . Disorders of bursae and tendons in shoulder region, unspecified   . Endometrial hyperplasia, unspecified   . Family history of malignant neoplasm of gastrointestinal tract   . GERD (gastroesophageal reflux disease)    takes Protonix daily  . History of bronchitis 06/2012  . Hyperlipidemia    takes Pravastatin daily  . Hypertension    takes Enalapril daily  . Insomnia    takes Restoril nightly  . Melanoma of skin, site unspecified   . Muscle spasms of head and/or neck    takes Zanaflex daily prn  . Nocturia   . Obesity   . Osteoporosis    takes Fosamax weekly on Sundays  . Other malaise and fatigue   . Pericardial effusion 05/06/2018  . Peripheral edema    takes Lasix daily  . Pneumonia   . Unspecified hypothyroidism   . Unspecified vitamin D deficiency   . Weakness    numbness,tingling,pain left arm    Past Surgical History:  Procedure Laterality Date  . COLONOSCOPY    . ESOPHAGOGASTRODUODENOSCOPY    . KNEE ARTHROSCOPY  2012  . MELANOMA EXCISION  2006   near scapula  . POSTERIOR CERVICAL FUSION/FORAMINOTOMY Left 12/08/2012   Procedure: Left C/7-T/1  Posterior Cervicothoracic laminotomy/foraminotomy; C/7-T/1 Posterior Cervical Arthrodesis ;  Surgeon: Hosie Spangle, MD;  Location: [facility];  Service: Neurosurgery;  Laterality: Left;  Left cervical seven-thoracic one posterior cervicothoracic laminectomy and foraminotomy, cervical seven-thoracic one posterior arthrodesis   . ROTATOR CUFF REPAIR  2010   Left  . [place] SURGERY  June/2014  . TOTAL KNEE ARTHROPLASTY  07/07/2011   Procedure: TOTAL KNEE ARTHROPLASTY;  Surgeon: Rudean Haskell, MD;  Location: [facility];  Service: Orthopedics;  Laterality: Right;  . TUBAL LIGATION    . TUMOR EXCISION  2008   endometrial  . TUMOR REMOVAL  2009   From uterus     Current Outpatient Medications  Medication Sig Dispense Refill  . alendronate (FOSAMAX) 70 MG tablet TAKE 1 TABLET EVERY 7 DAYS WITH A FULL GLASS OF WATER ON AN EMPTY STOMACH. TAKE ON SUNDAY 4 tablet 11  . Ascorbic Acid (VITAMIN C PO) Take 1 tablet by mouth daily.    . Aspirin (ANACIN PO) Take 2 tablets by mouth daily as needed (for pain).    . CALCIUM PO Take 1 tablet by mouth daily.    . cholecalciferol (VITAMIN D) 1000 UNITS tablet Take 1,000 Units by mouth daily.    . furosemide (LASIX) 40 MG  tablet TAKE 1/2 TABLET BY MOUTH DAILY 30 tablet 10  . losartan (COZAAR) 100 MG tablet Take 100 mg by mouth daily.    [name] MAGNESIUM PO Take 1 capsule by mouth daily.    . montelukast (SINGULAIR) 10 MG tablet Take 10 mg by mouth at bedtime.    . Nebivolol HCl (BYSTOLIC) 20 MG TABS Take 20 mg by mouth daily.    [name] omeprazole (PRILOSEC) 40 MG capsule Take 40 mg by mouth daily.    . pravastatin (PRAVACHOL) 40 MG tablet Take 40 mg by mouth at bedtime.     . temazepam (RESTORIL) 30 MG capsule Take 1 capsule (30 mg total) by mouth at bedtime as needed for sleep. 30 capsule 3  . VITAMIN E PO Take 1 capsule by mouth daily.     No current facility-administered medications for this visit.     Allergies:   Patient has no known allergies.    Social  History:  The patient  reports that she has never smoked. She has never used smokeless tobacco. She reports current alcohol use of about 4.0 standard drinks of alcohol per week. She reports that she does not use drugs.   Family History:  The patient's family history includes Arthritis in her sister; Coronary artery disease in an other family member; Drug abuse in her sister; Emphysema in her father; Heart attack in her paternal grandmother and paternal uncle; Heart disease in her father, mother, and sister; Heart failure in her father; Hyperlipidemia in her brother; Hypertension in her brother, mother, and sister; Other in her mother; Pancreatitis in her brother; Thyroid disease in her sister.    ROS:  Please see the history of present illness.   Otherwise, review of systems are positive for none.   All other systems are reviewed and negative.    PHYSICAL EXAM: VS:  BP (!) 150/84   Pulse (!) 58   Ht '4\' 11"'  (1.499 m)   Wt 178 lb (80.7 kg)   BMI 35.95 kg/m  , BMI Body mass index is 35.95 kg/m. Affect appropriate Healthy:  appears stated age 27: normal Neck supple with no adenopathy JVP normal no bruits no thyromegaly Lungs clear with no wheezing and good diaphragmatic motion Heart:  S1/S2 no murmur, no rub, gallop or click PMI normal Abdomen: benighn, BS positve, no tenderness, no AAA no bruit.  No HSM or HJR Distal pulses intact with no bruits No edema Neuro non-focal Skin warm and dry No muscular weakness    EKG:  05/07/18 SR low voltage nonspecific ST changes    Recent Labs: 05/06/2018: ALT 12; TSH 1.088 05/07/2018: BUN 17; Creatinine, Ser 0.95; Hemoglobin 9.9; Platelets 338; Potassium 4.1; Sodium 139    Lipid Panel    Component Value Date/Time   CHOL 237 (H) 07/21/2013 1433   TRIG 82.0 07/21/2013 1433   TRIG 114 07/08/2006 0918   HDL 50.00 07/21/2013 1433   CHOLHDL 5 07/21/2013 1433   VLDL 16.4 07/21/2013 1433   LDLCALC 116 (H) 07/07/2012 1448   LDLDIRECT 178.5  07/21/2013 1433      Wt Readings from Last 3 Encounters:  07/12/18 178 lb (80.7 kg)  05/07/18 176 lb 3.2 oz (79.9 kg)  06/26/14 172 lb (78 kg)      Other studies Reviewed: Additional studies/ records that were reviewed today include: Notes from hospital November, CXR, CTA, TTE labs and consult note.    ASSESSMENT AND PLAN:  1.  Pericardial Effusion:  Doing well repeat TTE ordered  If effusion still present will need to go back on colchicine and NSAI's 2. HTN:  Well controlled.  Continue current medications and low sodium Dash type diet.   3. HLD: on statin labs with primary    Current medicines are reviewed at length with the patient today.  The patient does not have concerns regarding medicines.  The following changes have been made:  None   Labs/ tests ordered today include: TTE  No orders of the defined types were placed in this encounter.    Disposition:   FU with cardiology PRN if effusion gone on TTE    Signed, Jenkins Rouge, MD  07/12/2018 8:39 AM    [address], [place], [place]  [address] Phone: [phone]; Fax: [phone]

## 2018-07-12 ENCOUNTER — Ambulatory Visit: Payer: 59 | Admitting: Cardiovascular Disease

## 2018-07-12 VITALS — BP 150/84 | HR 58 | Ht 59.0 in | Wt 178.0 lb

## 2018-07-12 DIAGNOSIS — E785 Hyperlipidemia, unspecified: Secondary | ICD-10-CM | POA: Diagnosis not present

## 2018-07-12 DIAGNOSIS — I3139 Other pericardial effusion (noninflammatory): Secondary | ICD-10-CM

## 2018-07-12 DIAGNOSIS — I10 Essential (primary) hypertension: Secondary | ICD-10-CM | POA: Diagnosis not present

## 2018-07-12 DIAGNOSIS — I313 Pericardial effusion (noninflammatory): Secondary | ICD-10-CM | POA: Diagnosis not present

## 2018-07-12 NOTE — Patient Instructions (Addendum)
Medication Instructions:   If you need a refill on your cardiac medications before your next appointment, please call your pharmacy.   Lab work:  If you have labs (blood work) drawn today and your tests are completely normal, you will receive your results only by: . MyChart Message (if you have MyChart) OR . A paper copy in the mail If you have any lab test that is abnormal or we need to change your treatment, we will call you to review the results.  Testing/Procedures: Your physician has requested that you have an echocardiogram. Echocardiography is a painless test that uses sound waves to create images of your heart. It provides your doctor with information about the size and shape of your heart and how well your heart's chambers and valves are working. This procedure takes approximately one hour. There are no restrictions for this procedure.  Follow-Up: At [facility], you and your health needs are our priority.  As part of our continuing mission to provide you with exceptional heart care, we have created designated Provider Care Teams.  These Care Teams include your primary Cardiologist (physician) and Advanced Practice Providers (APPs -  Physician Assistants and Nurse Practitioners) who all work together to provide you with the care you need, when you need it. Your physician recommends that you schedule a follow-up appointment as needed with Dr. Nishan.     

## 2018-07-19 ENCOUNTER — Ambulatory Visit (HOSPITAL_COMMUNITY): Payer: 59 | Attending: Cardiovascular Disease

## 2018-07-19 ENCOUNTER — Other Ambulatory Visit: Payer: Self-pay

## 2018-07-19 DIAGNOSIS — I3139 Other pericardial effusion (noninflammatory): Secondary | ICD-10-CM

## 2018-07-19 DIAGNOSIS — I313 Pericardial effusion (noninflammatory): Secondary | ICD-10-CM | POA: Diagnosis present

## 2018-07-20 ENCOUNTER — Other Ambulatory Visit (HOSPITAL_COMMUNITY): Payer: 59

## 2018-07-20 ENCOUNTER — Telehealth: Payer: Self-pay | Admitting: Cardiovascular Disease

## 2018-07-20 NOTE — Telephone Encounter (Signed)
Call came into the Triage CMA Pool. Howie Ill, RN is off today. Per new CMA protocol I will route this to the Triage Pool.

## 2018-07-20 NOTE — Telephone Encounter (Signed)
Informed pt of results. Pt verbalized understanding. 

## 2018-07-20 NOTE — Telephone Encounter (Signed)
New message ° ° °Patient is returning call for echo results.  °

## 2018-07-21 ENCOUNTER — Other Ambulatory Visit (HOSPITAL_COMMUNITY): Payer: 59

## 2018-09-29 ENCOUNTER — Other Ambulatory Visit: Payer: Self-pay | Admitting: Obstetrics and Gynecology

## 2018-09-29 DIAGNOSIS — Z1231 Encounter for screening mammogram for malignant neoplasm of breast: Secondary | ICD-10-CM

## 2018-12-01 ENCOUNTER — Ambulatory Visit
Admission: RE | Admit: 2018-12-01 | Discharge: 2018-12-01 | Disposition: A | Discharge status: Home / Self Care | Payer: 59 | Source: Ambulatory Visit | Attending: Obstetrics and Gynecology | Admitting: Obstetrics and Gynecology

## 2018-12-01 ENCOUNTER — Other Ambulatory Visit: Payer: Self-pay

## 2018-12-01 DIAGNOSIS — Z1231 Encounter for screening mammogram for malignant neoplasm of breast: Secondary | ICD-10-CM | POA: Diagnosis not present

## 2019-05-17 ENCOUNTER — Encounter: Payer: Self-pay | Admitting: Gastroenterology

## 2019-10-06 ENCOUNTER — Other Ambulatory Visit: Payer: Self-pay | Admitting: Obstetrics and Gynecology

## 2019-10-06 DIAGNOSIS — Z1231 Encounter for screening mammogram for malignant neoplasm of breast: Secondary | ICD-10-CM

## 2019-12-02 ENCOUNTER — Ambulatory Visit
Admission: RE | Admit: 2019-12-02 | Discharge: 2019-12-02 | Disposition: A | Discharge status: Home / Self Care | Payer: 59 | Source: Ambulatory Visit | Attending: Obstetrics and Gynecology | Admitting: Obstetrics and Gynecology

## 2019-12-02 DIAGNOSIS — Z1231 Encounter for screening mammogram for malignant neoplasm of breast: Secondary | ICD-10-CM

## 2019-12-09 IMAGING — CT CT ANGIO CHEST
2 of 6 series · 18 of 36 positions shown · IV contrast (iopamidol)
Comparison: Portable chest x-ray of 05/06/2018

CLINICAL DATA: Recent pneumonia, now with shortness of breath and
weakness

EXAM:
CT ANGIOGRAPHY CHEST WITH CONTRAST
TECHNIQUE: Multidetector CT imaging of the chest was performed using the
standard protocol during bolus administration of intravenous
contrast. Multiplanar CT image reconstructions and MIPs were
obtained to evaluate the vascular anatomy.
CONTRAST:  75mL IIKL4R-9IT IOPAMIDOL (IIKL4R-9IT) INJECTION 76%

[Series 6: pe thins · axial · 0.60mm/px · z∈[+1073,+1278]mm · 17 of 327 slices shown]
[im 17/327  lung]
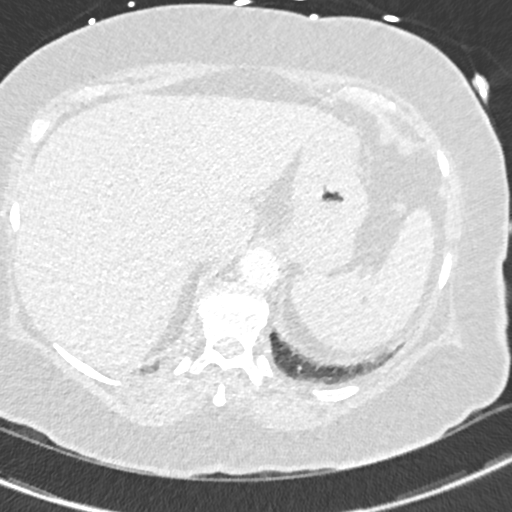
[im 33/327  mediastinal]
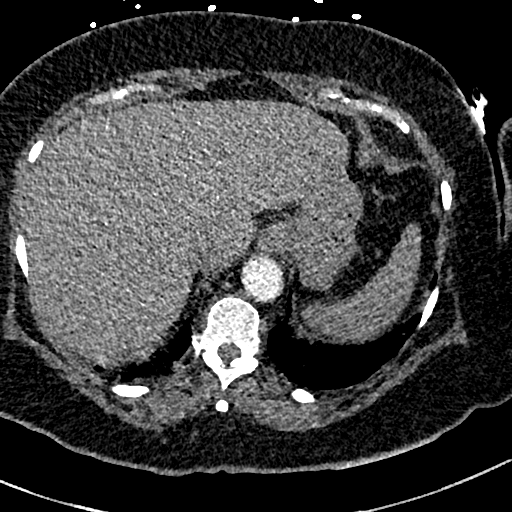
[im 49/327  lung]
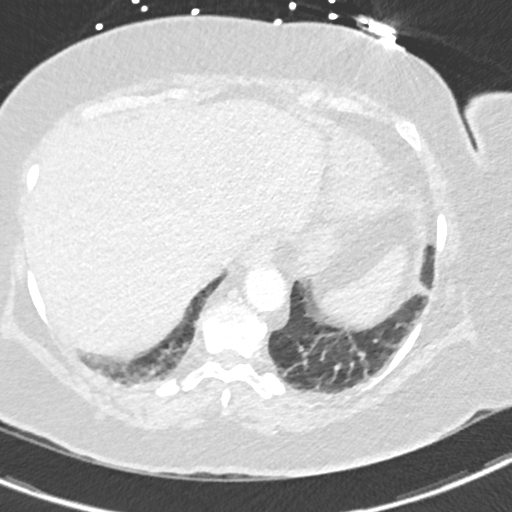
[im 66/327  mediastinal]
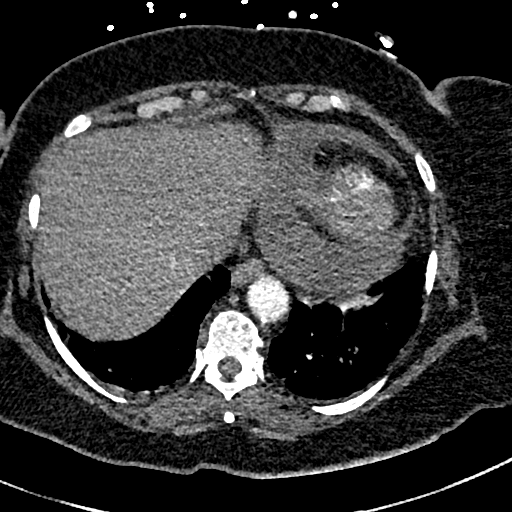
[im 98/327  lung]
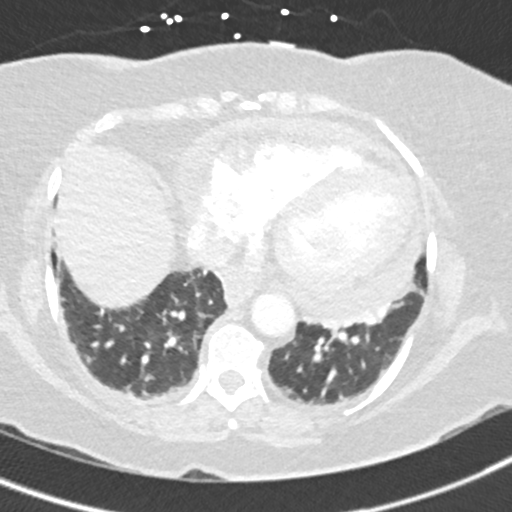
[im 115/327  mediastinal]
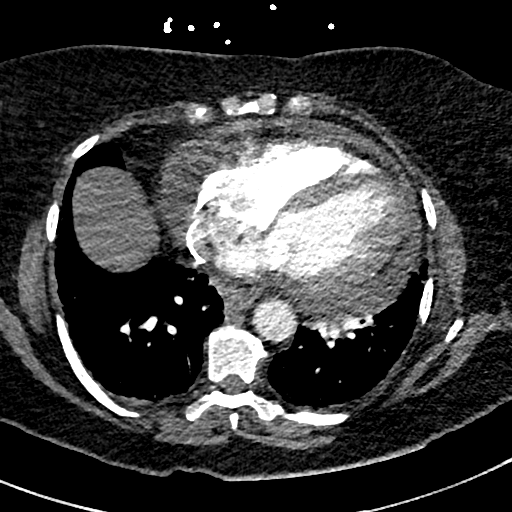
[im 131/327  lung]
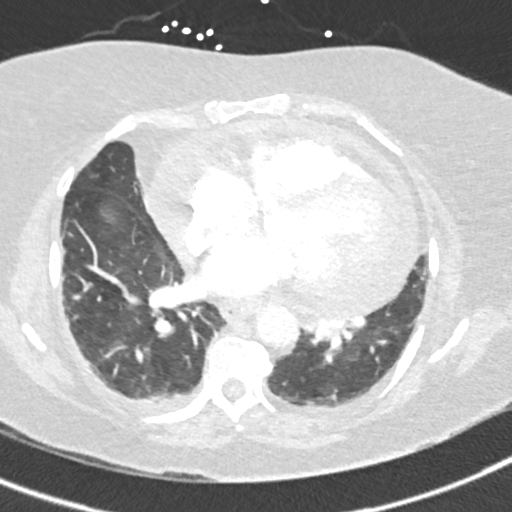
[im 147/327  mediastinal]
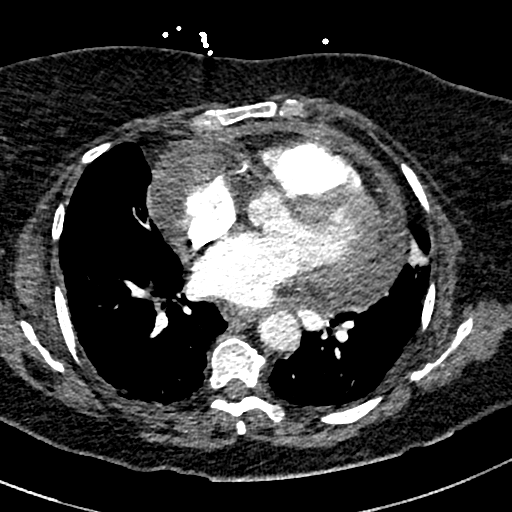
[im 164/327  lung]
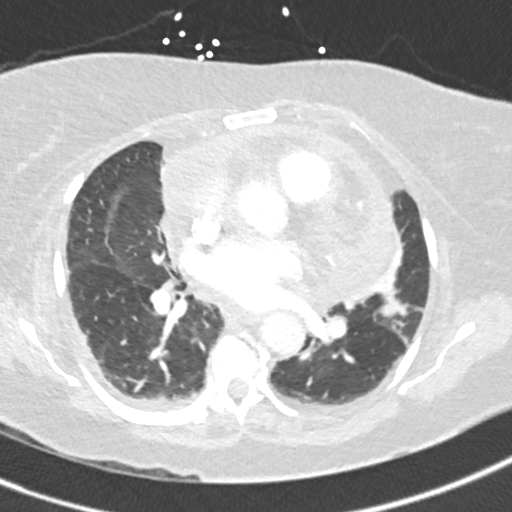
[im 180/327  mediastinal]
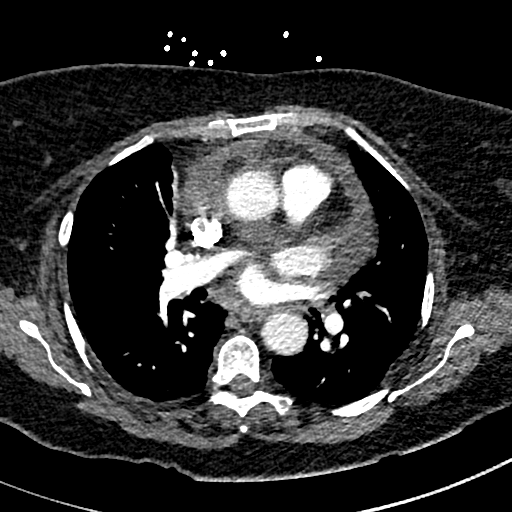
[im 196/327  lung]
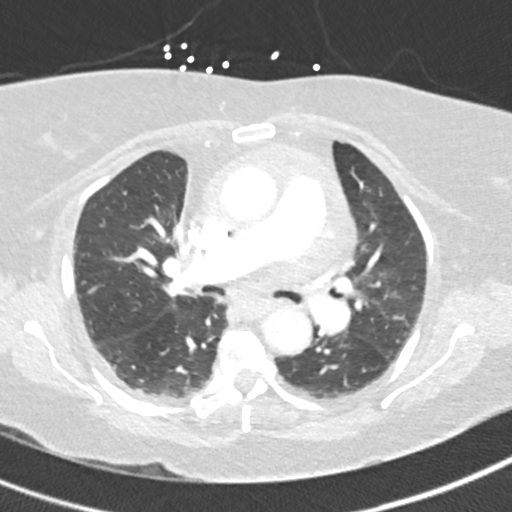
[im 212/327  mediastinal]
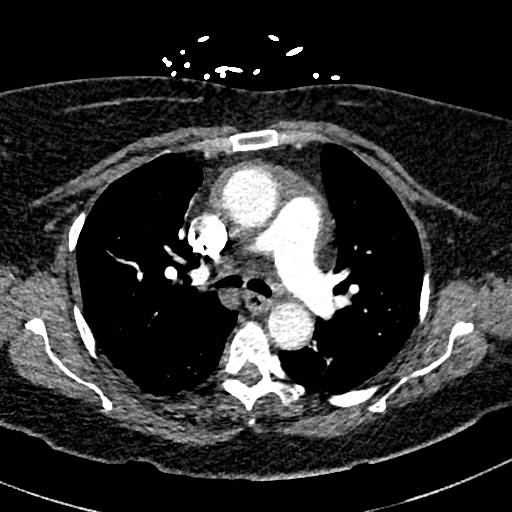
[im 229/327  lung]
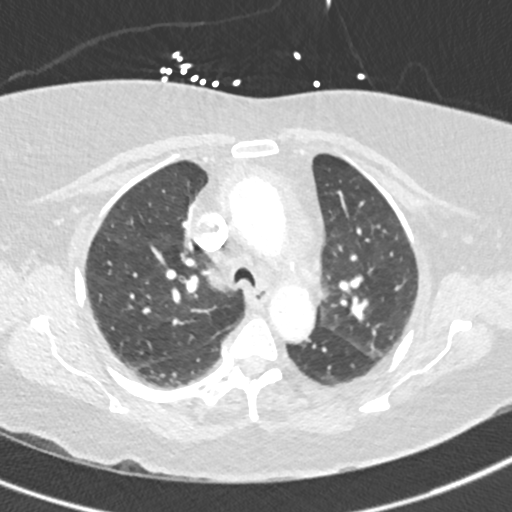
[im 261/327  mediastinal]
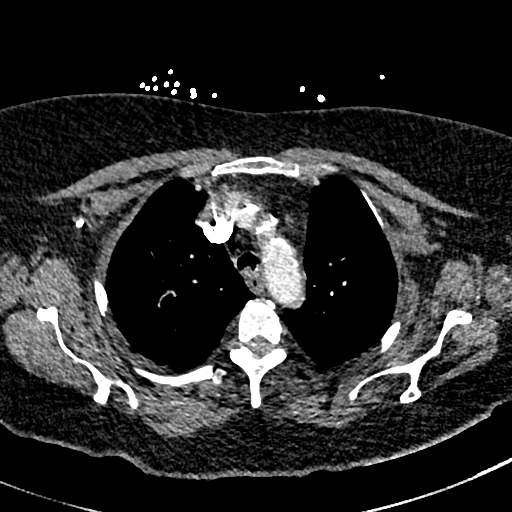
[im 278/327  lung]
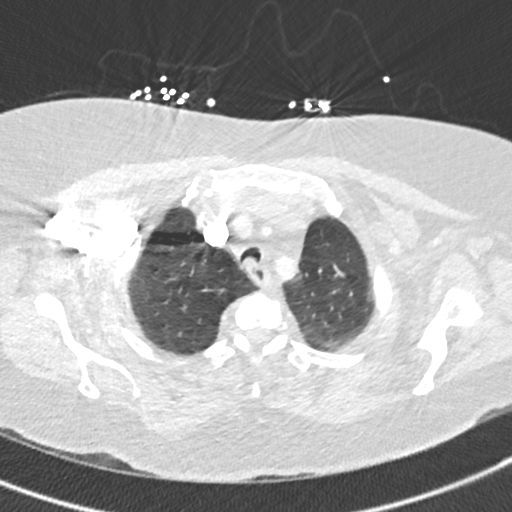
[im 294/327  mediastinal]
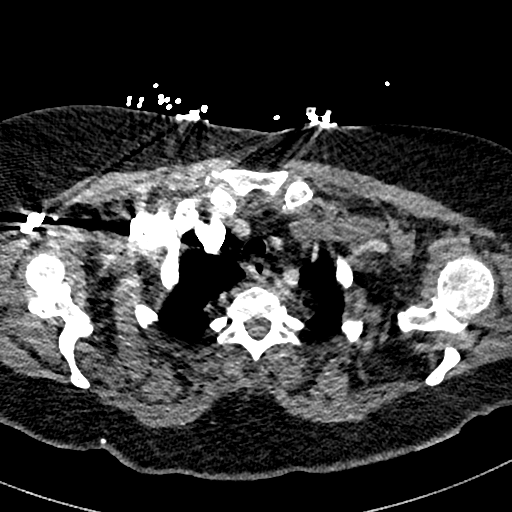
[im 310/327  lung]
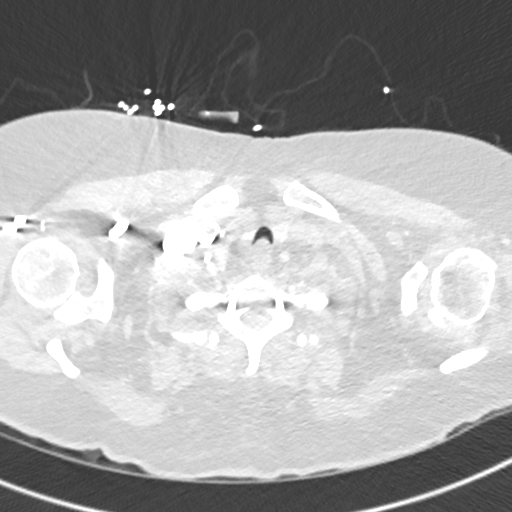

[Series 8: pe 2mm cor · coronal · 0.46mm/px · 1 of 139 slices shown]
[im 70/139  mediastinal]
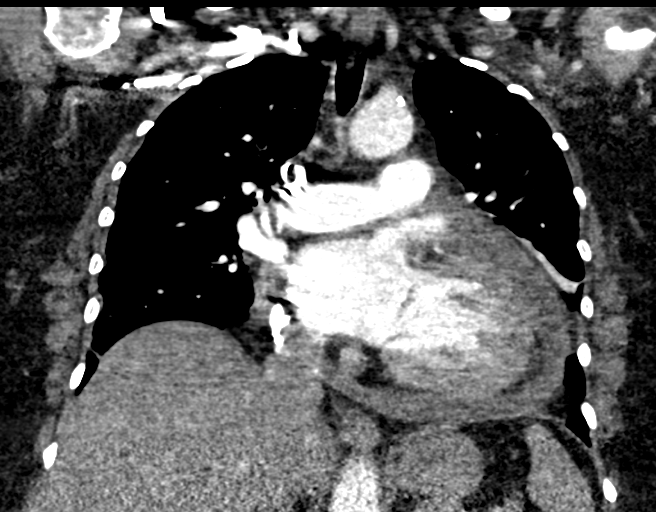

[18 of 36 positions shown; findings below may reference images not displayed]

FINDINGS: Cardiovascular: The pulmonary arteries are well opacified. There is
no evidence of acute pulmonary embolism. The thoracic aorta also
opacifies with no acute abnormality. Moderate thoracic aortic
atherosclerosis is present. The heart is mildly enlarged and there
is a moderate-sized pericardial effusion present. Coronary artery
calcifications are noted. The mid ascending thoracic aorta measures
33 mm in diameter.

Mediastinum/Nodes: No enlarged mediastinal or hilar adenopathy is
seen with only small nodes present. The thyroid gland is
unremarkable no abnormality of the esophagus is evident by CT.

Lungs/Pleura: There is linear atelectasis or scarring in the
lingula. However no pneumonia is seen and no pleural effusion is
noted. The central airway is patent.

Upper Abdomen: On images through the upper abdomen, no significant
abnormality is seen. The liver appears slightly prominent perhaps.

Musculoskeletal: There is mild thoracic curvature convex to the
right. There are degenerative changes in the mid to lower thoracic
spine.

Review of the MIP images confirms the above findings.
IMPRESSION: 1. No evidence of acute pulmonary embolism.
2. However, there is a moderately large pericardial effusion present
and cardiomegaly is noted. Coronary artery calcifications also are
present.
3. Mild linear atelectasis in the lingula. No pneumonia or effusion
is seen.

## 2020-01-26 ENCOUNTER — Other Ambulatory Visit: Payer: Self-pay

## 2020-01-26 ENCOUNTER — Other Ambulatory Visit
Admission: RE | Admit: 2020-01-26 | Discharge: 2020-01-26 | Disposition: A | Discharge status: Home / Self Care | Payer: 59 | Source: Ambulatory Visit | Attending: Gastroenterology | Admitting: Gastroenterology

## 2020-01-26 DIAGNOSIS — Z01812 Encounter for preprocedural laboratory examination: Secondary | ICD-10-CM | POA: Insufficient documentation

## 2020-01-26 DIAGNOSIS — Z20822 Contact with and (suspected) exposure to covid-19: Secondary | ICD-10-CM | POA: Diagnosis not present

## 2020-01-26 LAB — SARS CORONAVIRUS 2 (TAT 6-24 HRS): SARS Coronavirus 2: NEGATIVE

## 2020-01-27 ENCOUNTER — Encounter: Payer: Self-pay | Admitting: *Deleted

## 2020-01-30 ENCOUNTER — Encounter
Admission: RE | Disposition: A | Discharge status: Home / Self Care | Payer: Self-pay | Source: Home / Self Care | Attending: Gastroenterology

## 2020-01-30 ENCOUNTER — Encounter: Payer: Self-pay | Admitting: *Deleted

## 2020-01-30 ENCOUNTER — Other Ambulatory Visit: Payer: Self-pay

## 2020-01-30 ENCOUNTER — Ambulatory Visit: Payer: 59 | Admitting: Registered Nurse

## 2020-01-30 ENCOUNTER — Ambulatory Visit
Admission: RE | Admit: 2020-01-30 | Discharge: 2020-01-30 | Disposition: A | Discharge status: Home / Self Care | Payer: 59 | Attending: Gastroenterology | Admitting: Gastroenterology

## 2020-01-30 DIAGNOSIS — J45909 Unspecified asthma, uncomplicated: Secondary | ICD-10-CM | POA: Insufficient documentation

## 2020-01-30 DIAGNOSIS — Z7982 Long term (current) use of aspirin: Secondary | ICD-10-CM | POA: Diagnosis not present

## 2020-01-30 DIAGNOSIS — Z981 Arthrodesis status: Secondary | ICD-10-CM | POA: Diagnosis not present

## 2020-01-30 DIAGNOSIS — M810 Age-related osteoporosis without current pathological fracture: Secondary | ICD-10-CM | POA: Insufficient documentation

## 2020-01-30 DIAGNOSIS — K2970 Gastritis, unspecified, without bleeding: Secondary | ICD-10-CM | POA: Insufficient documentation

## 2020-01-30 DIAGNOSIS — D122 Benign neoplasm of ascending colon: Secondary | ICD-10-CM | POA: Diagnosis not present

## 2020-01-30 DIAGNOSIS — E785 Hyperlipidemia, unspecified: Secondary | ICD-10-CM | POA: Diagnosis not present

## 2020-01-30 DIAGNOSIS — D175 Benign lipomatous neoplasm of intra-abdominal organs: Secondary | ICD-10-CM | POA: Insufficient documentation

## 2020-01-30 DIAGNOSIS — E559 Vitamin D deficiency, unspecified: Secondary | ICD-10-CM | POA: Diagnosis not present

## 2020-01-30 DIAGNOSIS — I081 Rheumatic disorders of both mitral and tricuspid valves: Secondary | ICD-10-CM | POA: Diagnosis not present

## 2020-01-30 DIAGNOSIS — E039 Hypothyroidism, unspecified: Secondary | ICD-10-CM | POA: Insufficient documentation

## 2020-01-30 DIAGNOSIS — Z6835 Body mass index (BMI) 35.0-35.9, adult: Secondary | ICD-10-CM | POA: Diagnosis not present

## 2020-01-30 DIAGNOSIS — Z79899 Other long term (current) drug therapy: Secondary | ICD-10-CM | POA: Diagnosis not present

## 2020-01-30 DIAGNOSIS — R195 Other fecal abnormalities: Secondary | ICD-10-CM | POA: Diagnosis present

## 2020-01-30 DIAGNOSIS — D120 Benign neoplasm of cecum: Secondary | ICD-10-CM | POA: Diagnosis not present

## 2020-01-30 DIAGNOSIS — D649 Anemia, unspecified: Secondary | ICD-10-CM | POA: Insufficient documentation

## 2020-01-30 DIAGNOSIS — G4700 Insomnia, unspecified: Secondary | ICD-10-CM | POA: Diagnosis not present

## 2020-01-30 DIAGNOSIS — I10 Essential (primary) hypertension: Secondary | ICD-10-CM | POA: Insufficient documentation

## 2020-01-30 DIAGNOSIS — K648 Other hemorrhoids: Secondary | ICD-10-CM | POA: Diagnosis not present

## 2020-01-30 DIAGNOSIS — Z85820 Personal history of malignant melanoma of skin: Secondary | ICD-10-CM | POA: Diagnosis not present

## 2020-01-30 DIAGNOSIS — I272 Pulmonary hypertension, unspecified: Secondary | ICD-10-CM | POA: Insufficient documentation

## 2020-01-30 DIAGNOSIS — K219 Gastro-esophageal reflux disease without esophagitis: Secondary | ICD-10-CM | POA: Insufficient documentation

## 2020-01-30 DIAGNOSIS — K5730 Diverticulosis of large intestine without perforation or abscess without bleeding: Secondary | ICD-10-CM | POA: Diagnosis not present

## 2020-01-30 DIAGNOSIS — E669 Obesity, unspecified: Secondary | ICD-10-CM | POA: Insufficient documentation

## 2020-01-30 HISTORY — PX: COLONOSCOPY: SHX5424

## 2020-01-30 HISTORY — PX: ESOPHAGOGASTRODUODENOSCOPY: SHX5428

## 2020-01-30 SURGERY — EGD (ESOPHAGOGASTRODUODENOSCOPY)
Anesthesia: General

## 2020-01-30 MED ORDER — DEXMEDETOMIDINE HCL 200 MCG/2ML IV SOLN
INTRAVENOUS | Status: DC | PRN
  Administered 2020-01-30: 16 ug via INTRAVENOUS

## 2020-01-30 MED ORDER — SODIUM CHLORIDE 0.9 % IV SOLN: INTRAVENOUS | Status: DC

## 2020-01-30 MED ORDER — PROPOFOL 10 MG/ML IV BOLUS
INTRAVENOUS | Status: DC | PRN
  Administered 2020-01-30: 70 mg via INTRAVENOUS

## 2020-01-30 MED ORDER — LIDOCAINE HCL (CARDIAC) PF 100 MG/5ML IV SOSY
PREFILLED_SYRINGE | INTRAVENOUS | Status: DC | PRN
  Administered 2020-01-30: 80 mg via INTRAVENOUS

## 2020-01-30 MED ORDER — LIDOCAINE HCL (PF) 2 % IJ SOLN
INTRAMUSCULAR | Status: AC
  Filled 2020-01-30: qty 15

## 2020-01-30 MED ORDER — GLYCOPYRROLATE 0.2 MG/ML IJ SOLN
INTRAMUSCULAR | Status: AC
  Filled 2020-01-30: qty 1

## 2020-01-30 MED ORDER — PROPOFOL 500 MG/50ML IV EMUL
INTRAVENOUS | Status: DC | PRN
  Administered 2020-01-30: 175 ug/kg/min via INTRAVENOUS

## 2020-01-30 MED ORDER — GLYCOPYRROLATE 0.2 MG/ML IJ SOLN
INTRAMUSCULAR | Status: DC | PRN
  Administered 2020-01-30: .2 mg via INTRAVENOUS

## 2020-01-30 NOTE — Anesthesia Postprocedure Evaluation (Signed)
Anesthesia Post Note  Patient: Michelle [name]  Procedure(s) Performed: ESOPHAGOGASTRODUODENOSCOPY (EGD) (N/A ) COLONOSCOPY (N/A )  Patient location during evaluation: Endoscopy Anesthesia Type: General Level of consciousness: awake and alert and oriented Pain management: pain level controlled Vital Signs Assessment: post-procedure vital signs reviewed and stable Respiratory status: spontaneous breathing, nonlabored ventilation and respiratory function stable Cardiovascular status: blood pressure returned to baseline and stable Postop Assessment: no signs of nausea or vomiting Anesthetic complications: no   No complications documented.   Last Vitals:  Vitals:   01/30/20 0946 01/30/20 0956  BP: 125/76 (!) 143/77  Pulse: 67 60  Resp: 18 15  Temp:    SpO2: 95% 98%    Last Pain:  Vitals:   01/30/20 0956  TempSrc:   PainSc: 0-No pain                 Emiah Pellicano

## 2020-01-30 NOTE — Op Note (Signed)
[facility]astroenterology Patient Name: Michelle [name] Procedure Date: 01/30/2020 8:48 AM MRN: [id number] Account #: [id number] Date of Birth: Nov 17, 1945 Admit Type: Outpatient Age: 74 Room: [facility] ENDO ROOM 1 Gender: Female Note Status: Finalized Procedure:             Upper GI endoscopy Indications:           Suspected gastro-esophageal reflux disease Providers:             Andrey Farmer MD, MD Referring MD:          Bernerd Limbo (Referring MD) Medicines:             Monitored Anesthesia Care Complications:         No immediate complications. Estimated blood loss:                         Minimal. Procedure:             Pre-Anesthesia Assessment:                        - Prior to the procedure, a History and Physical was                         performed, and patient medications and allergies were                         reviewed. The patient is competent. The risks and                         benefits of the procedure and the sedation options and                         risks were discussed with the patient. All questions                         were answered and informed consent was obtained.                         Patient identification and proposed procedure were                         verified by the physician, the nurse, the anesthetist                         and the technician in the endoscopy suite. Mental                         Status Examination: alert and oriented. Airway                         Examination: normal oropharyngeal airway and neck                         mobility. Respiratory Examination: clear to                         auscultation. CV Examination: normal. Prophylactic  Antibiotics: The patient does not require prophylactic                         antibiotics. Prior Anticoagulants: The patient has                         taken no previous anticoagulant or antiplatelet                         agents. ASA Grade  Assessment: II - A patient with mild                         systemic disease. After reviewing the risks and                         benefits, the patient was deemed in satisfactory                         condition to undergo the procedure. The anesthesia                         plan was to use monitored anesthesia care (MAC).                         Immediately prior to administration of medications,                         the patient was re-assessed for adequacy to receive                         sedatives. The heart rate, respiratory rate, oxygen                         saturations, blood pressure, adequacy of pulmonary                         ventilation, and response to care were monitored                         throughout the procedure. The physical status of the                         patient was re-assessed after the procedure.                        After obtaining informed consent, the endoscope was                         passed under direct vision. Throughout the procedure,                         the patient's blood pressure, pulse, and oxygen                         saturations were monitored continuously. The Endoscope                         was introduced through the mouth, and advanced to the  second part of duodenum. The upper GI endoscopy was                         accomplished without difficulty. The patient tolerated                         the procedure well. Findings:      The examined esophagus was normal.      Patchy mild inflammation was found in the gastric antrum. Biopsies were       taken with a cold forceps for Helicobacter pylori testing. Estimated       blood loss was minimal.      The examined duodenum was normal. Impression:            - Normal esophagus.                        - Gastritis. Biopsied.                        - Normal examined duodenum. Recommendation:        - Discharge patient to home.                         - Resume previous diet.                        - Continue present medications.                        - Await pathology results.                        - Perform a colonoscopy today. Procedure Code(s):     --- Professional ---                        [phone], Esophagogastroduodenoscopy, flexible,                         transoral; with biopsy, single or multiple Diagnosis Code(s):     --- Professional ---                        K29.70, Gastritis, unspecified, without bleeding CPT copyright 2019 [employer]. All rights reserved. The codes documented in this report are preliminary and upon coder review may  be revised to meet current compliance requirements. Andrey Farmer, MD Andrey Farmer MD, MD 01/30/2020 9:29:32 AM Number of Addenda: 0 Note Initiated On: 01/30/2020 8:48 AM Estimated Blood Loss:  Estimated blood loss was minimal.      [facility]

## 2020-01-30 NOTE — Transfer of Care (Signed)
Immediate Anesthesia Transfer of Care Note  Patient: Michelle [name]  Procedure(s) Performed: ESOPHAGOGASTRODUODENOSCOPY (EGD) (N/A ) COLONOSCOPY (N/A )  Patient Location: PACU and Endoscopy Unit  Anesthesia Type:General  Level of Consciousness: drowsy  Airway & Oxygen Therapy: Patient Spontanous Breathing  Post-op Assessment: Report given to RN and Post -op Vital signs reviewed and stable  Post vital signs: Reviewed and stable  Last Vitals:  Vitals Value Taken Time  BP 89/51 01/30/20 0926  Temp    Pulse 56 01/30/20 0929  Resp 16 01/30/20 0929  SpO2 98 % 01/30/20 0929  Vitals shown include unvalidated device data.  Last Pain:  Vitals:   01/30/20 0820  TempSrc: Temporal         Complications: No complications documented.

## 2020-01-30 NOTE — Op Note (Signed)
[facility] Gastroenterology Patient Name: Michelle [name] Procedure Date: 01/30/2020 8:46 AM MRN: [id number] Account #: [id number] Date of Birth: May 15, 1946 Admit Type: Outpatient Age: 74 Room: [facility] ENDO ROOM 1 Gender: Female Note Status: Finalized Procedure:             Colonoscopy Indications:           Positive fecal immunochemical test Providers:             Andrey Farmer MD, MD Referring MD:          Bernerd Limbo (Referring MD) Medicines:             Monitored Anesthesia Care Complications:         No immediate complications. Estimated blood loss:                         Minimal. Procedure:             Pre-Anesthesia Assessment:                        - Prior to the procedure, a History and Physical was                         performed, and patient medications and allergies were                         reviewed. The patient is competent. The risks and                         benefits of the procedure and the sedation options and                         risks were discussed with the patient. All questions                         were answered and informed consent was obtained.                         Patient identification and proposed procedure were                         verified by the physician, the nurse, the anesthetist                         and the technician in the endoscopy suite. Mental                         Status Examination: alert and oriented. Airway                         Examination: normal oropharyngeal airway and neck                         mobility. Respiratory Examination: clear to                         auscultation. CV Examination: normal. Prophylactic                         Antibiotics: The  patient does not require prophylactic                         antibiotics. Prior Anticoagulants: The patient has                         taken no previous anticoagulant or antiplatelet                         agents. ASA Grade Assessment: II  - A patient with mild                         systemic disease. After reviewing the risks and                         benefits, the patient was deemed in satisfactory                         condition to undergo the procedure. The anesthesia                         plan was to use monitored anesthesia care (MAC).                         Immediately prior to administration of medications,                         the patient was re-assessed for adequacy to receive                         sedatives. The heart rate, respiratory rate, oxygen                         saturations, blood pressure, adequacy of pulmonary                         ventilation, and response to care were monitored                         throughout the procedure. The physical status of the                         patient was re-assessed after the procedure.                        After obtaining informed consent, the colonoscope was                         passed under direct vision. Throughout the procedure,                         the patient's blood pressure, pulse, and oxygen                         saturations were monitored continuously. The                         Colonoscope was introduced through the anus and  advanced to the the cecum, identified by appendiceal                         orifice and ileocecal valve. The colonoscopy was                         performed without difficulty. The patient tolerated                         the procedure well. The quality of the bowel                         preparation was good. Findings:      The perianal and digital rectal examinations were normal.      A 8 mm polyp was found in the proximal ascending colon. The polyp was       sessile. The polyp was removed with a cold snare. Resection and       retrieval were complete. Estimated blood loss was minimal.      A 4 mm polyp was found in the cecum. The polyp was sessile. The polyp       was removed  with a cold snare. Resection and retrieval were complete.       Estimated blood loss was minimal.      The ileocecal valve was moderately lipomatous. Biopsies were taken with       a cold forceps for histology. Estimated blood loss was minimal.      A few small-mouthed diverticula were found in the sigmoid colon and       ascending colon.      Internal hemorrhoids were found during retroflexion. The hemorrhoids       were small.      The exam was otherwise without abnormality on direct and retroflexion       views. Impression:            - One 8 mm polyp in the proximal ascending colon,                         removed with a cold snare. Resected and retrieved.                        - One 4 mm polyp in the cecum, removed with a cold                         snare. Resected and retrieved.                        - Lipomatous ileocecal valve. Biopsied.                        - Diverticulosis in the sigmoid colon and in the                         ascending colon.                        - Internal hemorrhoids.                        - The examination was otherwise normal on direct and  retroflexion views. Recommendation:        - Discharge patient to home.                        - Resume previous diet.                        - Continue present medications.                        - Await pathology results.                        - Repeat colonoscopy in 7 years if benefits outweigh                         risks for surveillance.                        - Return to referring physician as previously                         scheduled. Procedure Code(s):     --- Professional ---                        [phone], Colonoscopy, flexible; with removal of                         tumor(s), polyp(s), or other lesion(s) by snare                         technique                        45380, 64, Colonoscopy, flexible; with biopsy, single                         or multiple Diagnosis  Code(s):     --- Professional ---                        K63.5, Polyp of colon                        K63.89, Other specified diseases of intestine                        K64.8, Other hemorrhoids                        R19.5, Other fecal abnormalities                        K57.30, Diverticulosis of large intestine without                         perforation or abscess without bleeding CPT copyright 2019 American [employer] Association. All rights reserved. The codes documented in this report are preliminary and upon coder review may  be revised to meet current compliance requirements. Andrey Farmer, MD Andrey Farmer MD, MD 01/30/2020 9:35:09 AM Number of Addenda: 0 Note Initiated On: 01/30/2020 8:46 AM Scope Withdrawal Time: 0 hours 13 minutes 20 seconds  Total Procedure Duration:  0 hours 20 minutes 15 seconds  Estimated Blood Loss:  Estimated blood loss was minimal.      [facility]

## 2020-01-30 NOTE — Anesthesia Preprocedure Evaluation (Addendum)
Anesthesia Evaluation  Patient identified by MRN, date of birth, ID band Patient awake    Reviewed: Allergy & Precautions, H&P , NPO status , Patient's Chart, lab work & pertinent test results  History of Anesthesia Complications Negative for: history of anesthetic complications  Airway Mallampati: II  TM Distance: >3 FB     Dental  (+) Teeth Intact   Pulmonary asthma ,    breath sounds clear to auscultation       Cardiovascular Exercise Tolerance: Poor hypertension, (-) angina(-) Past MI and (-) Cardiac Stents (-) dysrhythmias  Rhythm:regular Rate:Normal  Echo 2020: Normal LV systolic function; mild diastolic dysfunction; mild LVH; trace AI; mild to moderate MR; mild TR with mild pulmonary hypertension.    Neuro/Psych negative neurological ROS  negative psych ROS   GI/Hepatic Neg liver ROS, GERD  ,  Endo/Other  Hypothyroidism   Renal/GU negative Renal ROS  negative genitourinary   Musculoskeletal   Abdominal   Peds  Hematology  (+) Blood dyscrasia, anemia ,   Anesthesia Other Findings Past Medical History: No date: Arthritis     Comment:  R knee and Left  foot No date: Asthma No date: Cancer ([place])     Comment:  Melanoma on back- Stage 1 No date: Disorders of bursae and tendons in shoulder region,  unspecified No date: Endometrial hyperplasia, unspecified No date: Family history of malignant neoplasm of gastrointestinal  tract No date: GERD (gastroesophageal reflux disease)     Comment:  takes Protonix daily 06/2012: History of bronchitis No date: Hyperlipidemia     Comment:  takes Pravastatin daily No date: Hypertension     Comment:  takes Enalapril daily No date: Insomnia     Comment:  takes Restoril nightly No date: Melanoma of skin, site unspecified No date: Muscle spasms of head and/or neck     Comment:  takes Zanaflex daily prn No date: Nocturia No date: Obesity No date: Osteoporosis      Comment:  takes Fosamax weekly on Sundays No date: Other malaise and fatigue 05/06/2018: Pericardial effusion No date: Peripheral edema     Comment:  takes Lasix daily No date: Pneumonia No date: Unspecified hypothyroidism No date: Unspecified vitamin D deficiency No date: Weakness     Comment:  numbness,tingling,pain left arm  Past Surgical History: No date: COLONOSCOPY No date: ESOPHAGOGASTRODUODENOSCOPY No date: JOINT REPLACEMENT 2012: KNEE ARTHROSCOPY 2006: MELANOMA EXCISION     Comment:  near scapula 12/08/2012: POSTERIOR CERVICAL FUSION/FORAMINOTOMY; Left     Comment:  Procedure: Left C/7-T/1 Posterior Cervicothoracic               laminotomy/foraminotomy; C/7-T/1 Posterior Cervical               Arthrodesis ;  Surgeon: Hosie Spangle, MD;  Location:              [place] [facility];  Service: Neurosurgery;  Laterality: Left;               Left cervical seven-thoracic one posterior               cervicothoracic laminectomy and foraminotomy, cervical               seven-thoracic one posterior arthrodesis  2010: ROTATOR CUFF REPAIR     Comment:  Left June/2014: SPINE SURGERY 07/07/2011: TOTAL KNEE ARTHROPLASTY     Comment:  Procedure: TOTAL KNEE ARTHROPLASTY;  Surgeon: Estill Bamberg  Ronnie Derby, MD;  Location: [place];  Service: Orthopedics;                Laterality: Right; No date: TUBAL LIGATION 2008: TUMOR EXCISION     Comment:  endometrial 2009: TUMOR REMOVAL     Comment:  From uterus  BMI    Body Mass Index: 35.35 kg/m      Reproductive/Obstetrics negative OB ROS                           Anesthesia Physical Anesthesia Plan  ASA: III  Anesthesia Plan: General   Post-op Pain Management:    Induction:   PONV Risk Score and Plan: Propofol infusion and TIVA  Airway Management Planned: Nasal Cannula  Additional Equipment:   Intra-op Plan:   Post-operative Plan:   Informed Consent: I have reviewed the patients History and  Physical, chart, labs and discussed the procedure including the risks, benefits and alternatives for the proposed anesthesia with the patient or authorized representative who has indicated his/her understanding and acceptance.     Dental Advisory Given  Plan Discussed with: Anesthesiologist, CRNA and Surgeon  Anesthesia Plan Comments:        Anesthesia Quick Evaluation

## 2020-01-30 NOTE — H&P (Signed)
Outpatient short stay form Pre-procedure 01/30/2020 8:34 AM Michelle Miyamoto MD, MPH  Primary Physician: Dr. Coletta Memos  Reason for visit:  FIT positive/GERD  History of present illness:   74 y/o lady with history of hypertension and heartburn and presumably polyps but no records here for EGD/Colonoscopy for GERD and FIT positive. No blood thinners. No family history of GI malignancies.    Current Facility-Administered Medications:  .  0.9 %  sodium chloride infusion, , Intravenous, Continuous, [name], [name], MD  Medications Prior to Admission  Medication Sig Dispense Refill Last Dose  . Aspirin (ANACIN PO) Take 2 tablets by mouth daily as needed (for pain).   Past Week at Unknown time  . furosemide (LASIX) 40 MG tablet TAKE 1/2 TABLET BY MOUTH DAILY 30 tablet 10 01/29/2020 at Unknown time  . Nebivolol HCl (BYSTOLIC) 20 MG TABS Take 20 mg by mouth daily.   01/30/2020 at Unknown time  . olmesartan (BENICAR) 40 MG tablet Take 40 mg by mouth daily.     [name] omeprazole (PRILOSEC) 40 MG capsule Take 40 mg by mouth daily.   01/29/2020 at Unknown time  . oxyCODONE-acetaminophen (PERCOCET/ROXICET) 5-325 MG tablet Take 1 tablet by mouth at bedtime as needed for severe pain.     [name] temazepam (RESTORIL) 30 MG capsule Take 1 capsule (30 mg total) by mouth at bedtime as needed for sleep. 30 capsule 3 01/29/2020 at Unknown time  . alendronate (FOSAMAX) 70 MG tablet TAKE 1 TABLET EVERY 7 DAYS WITH A FULL GLASS OF WATER ON AN EMPTY STOMACH. TAKE ON SUNDAY 4 tablet 11   . Ascorbic Acid (VITAMIN C PO) Take 1 tablet by mouth daily.     [name] CALCIUM PO Take 1 tablet by mouth daily.     . cholecalciferol (VITAMIN D) 1000 UNITS tablet Take 1,000 Units by mouth daily.     [name] losartan (COZAAR) 100 MG tablet Take 100 mg by mouth daily. (Patient not taking: Reported on 01/30/2020)   Not Taking at Unknown time  . MAGNESIUM PO Take 1 capsule by mouth daily.     . montelukast (SINGULAIR) 10 MG tablet Take 10 mg by mouth at bedtime.      . pravastatin (PRAVACHOL) 40 MG tablet Take 40 mg by mouth at bedtime.      [name] VITAMIN E PO Take 1 capsule by mouth daily.        No Known Allergies   Past Medical History:  Diagnosis Date  . Arthritis    R knee and Left  foot  . Asthma   . Cancer ([place])    Melanoma on back- Stage 1  . Disorders of bursae and tendons in shoulder region, unspecified   . Endometrial hyperplasia, unspecified   . Family history of malignant neoplasm of gastrointestinal tract   . GERD (gastroesophageal reflux disease)    takes Protonix daily  . History of bronchitis 06/2012  . Hyperlipidemia    takes Pravastatin daily  . Hypertension    takes Enalapril daily  . Insomnia    takes Restoril nightly  . Melanoma of skin, site unspecified   . Muscle spasms of head and/or neck    takes Zanaflex daily prn  . Nocturia   . Obesity   . Osteoporosis    takes Fosamax weekly on Sundays  . Other malaise and fatigue   . Pericardial effusion 05/06/2018  . Peripheral edema    takes Lasix daily  . Pneumonia   . Unspecified hypothyroidism   .  Unspecified vitamin D deficiency   . Weakness    numbness,tingling,pain left arm    Review of systems:  Otherwise negative.    Physical Exam  Gen: Alert, oriented. Appears stated age.  HEENT: [place]/AT. PERRLA. Lungs: No respiratory distress Abd: soft, benign, no masses. BS+ Ext: No edema. Pulses 2+    Planned procedures: Proceed with EGD/colonoscopy. The patient understands the nature of the planned procedure, indications, risks, alternatives and potential complications including but not limited to bleeding, infection, perforation, damage to internal organs and possible oversedation/side effects from anesthesia. The patient agrees and gives consent to proceed.  Please refer to procedure notes for findings, recommendations and patient disposition/instructions.     Michelle Miyamoto MD, MPH Gastroenterology 01/30/2020  8:34 AM

## 2020-01-30 NOTE — Interval H&P Note (Signed)
History and Physical Interval Note:  01/30/2020 8:41 AM  Michelle [name]  has presented today for surgery, with the diagnosis of GERD,HEME+STOOLS.  The various methods of treatment have been discussed with the patient and family. After consideration of risks, benefits and other options for treatment, the patient has consented to  Procedure(s): ESOPHAGOGASTRODUODENOSCOPY (EGD) (N/A) COLONOSCOPY (N/A) as a surgical intervention.  The patient's history has been reviewed, patient examined, no change in status, stable for surgery.  I have reviewed the patient's chart and labs.  Questions were answered to the patient's satisfaction.     Lesly Rubenstein  Ok to proceed with EGD/Colonoscopy

## 2020-01-31 LAB — SURGICAL PATHOLOGY

## 2020-10-17 ENCOUNTER — Other Ambulatory Visit: Payer: Self-pay | Admitting: Obstetrics and Gynecology

## 2020-10-17 ENCOUNTER — Ambulatory Visit
Admission: RE | Admit: 2020-10-17 | Discharge: 2020-10-17 | Disposition: A | Discharge status: Home / Self Care | Payer: Medicare Other | Source: Ambulatory Visit | Attending: Obstetrics and Gynecology | Admitting: Obstetrics and Gynecology

## 2020-10-17 ENCOUNTER — Other Ambulatory Visit: Payer: Self-pay

## 2020-10-17 ENCOUNTER — Other Ambulatory Visit (HOSPITAL_COMMUNITY): Payer: Self-pay | Admitting: Obstetrics and Gynecology

## 2020-10-17 DIAGNOSIS — M79662 Pain in left lower leg: Secondary | ICD-10-CM | POA: Diagnosis not present

## 2020-10-22 ENCOUNTER — Other Ambulatory Visit: Payer: Self-pay | Admitting: Obstetrics and Gynecology

## 2020-10-22 DIAGNOSIS — Z1231 Encounter for screening mammogram for malignant neoplasm of breast: Secondary | ICD-10-CM

## 2020-12-03 ENCOUNTER — Ambulatory Visit
Admission: RE | Admit: 2020-12-03 | Discharge: 2020-12-03 | Disposition: A | Discharge status: Home / Self Care | Payer: Medicare Other | Source: Ambulatory Visit | Attending: Obstetrics and Gynecology | Admitting: Obstetrics and Gynecology

## 2020-12-03 ENCOUNTER — Other Ambulatory Visit: Payer: Self-pay

## 2020-12-03 DIAGNOSIS — Z1231 Encounter for screening mammogram for malignant neoplasm of breast: Secondary | ICD-10-CM | POA: Diagnosis present

## 2020-12-19 ENCOUNTER — Other Ambulatory Visit: Payer: Self-pay

## 2020-12-19 ENCOUNTER — Emergency Department (HOSPITAL_COMMUNITY)
Admission: EM | Admit: 2020-12-19 | Discharge: 2020-12-20 | Disposition: A | Discharge status: Home / Self Care | Payer: Medicare Other | Attending: Emergency Medicine | Admitting: Emergency Medicine

## 2020-12-19 ENCOUNTER — Emergency Department (HOSPITAL_COMMUNITY): Payer: Medicare Other

## 2020-12-19 DIAGNOSIS — I5020 Unspecified systolic (congestive) heart failure: Secondary | ICD-10-CM | POA: Diagnosis not present

## 2020-12-19 DIAGNOSIS — Z85828 Personal history of other malignant neoplasm of skin: Secondary | ICD-10-CM | POA: Diagnosis not present

## 2020-12-19 DIAGNOSIS — E039 Hypothyroidism, unspecified: Secondary | ICD-10-CM | POA: Diagnosis not present

## 2020-12-19 DIAGNOSIS — I10 Essential (primary) hypertension: Secondary | ICD-10-CM

## 2020-12-19 DIAGNOSIS — Z96651 Presence of right artificial knee joint: Secondary | ICD-10-CM | POA: Insufficient documentation

## 2020-12-19 DIAGNOSIS — Z79899 Other long term (current) drug therapy: Secondary | ICD-10-CM | POA: Diagnosis not present

## 2020-12-19 DIAGNOSIS — I110 Hypertensive heart disease with heart failure: Secondary | ICD-10-CM | POA: Insufficient documentation

## 2020-12-19 DIAGNOSIS — J45909 Unspecified asthma, uncomplicated: Secondary | ICD-10-CM | POA: Insufficient documentation

## 2020-12-19 DIAGNOSIS — R0602 Shortness of breath: Secondary | ICD-10-CM | POA: Diagnosis present

## 2020-12-19 LAB — CBC WITH DIFFERENTIAL/PLATELET
Abs Immature Granulocytes: 0.04 10*3/uL (ref 0.00–0.07)
Basophils Absolute: 0.1 10*3/uL (ref 0.0–0.1)
Basophils Relative: 1 %
Eosinophils Absolute: 0.1 10*3/uL (ref 0.0–0.5)
Eosinophils Relative: 1 %
HCT: 43.7 % (ref 36.0–46.0)
Hemoglobin: 13.7 g/dL (ref 12.0–15.0)
Immature Granulocytes: 1 %
Lymphocytes Relative: 18 %
Lymphs Abs: 1.3 10*3/uL (ref 0.7–4.0)
MCH: 26.8 pg (ref 26.0–34.0)
MCHC: 31.4 g/dL (ref 30.0–36.0)
MCV: 85.4 fL (ref 80.0–100.0)
Monocytes Absolute: 0.7 10*3/uL (ref 0.1–1.0)
Monocytes Relative: 9 %
Neutro Abs: 5.3 10*3/uL (ref 1.7–7.7)
Neutrophils Relative %: 70 %
Platelets: 273 10*3/uL (ref 150–400)
RBC: 5.12 MIL/uL — ABNORMAL HIGH (ref 3.87–5.11)
RDW: 15 % (ref 11.5–15.5)
WBC: 7.5 10*3/uL (ref 4.0–10.5)
nRBC: 0 % (ref 0.0–0.2)

## 2020-12-19 LAB — COMPREHENSIVE METABOLIC PANEL
ALT: 20 U/L (ref 0–44)
AST: 18 U/L (ref 15–41)
Albumin: 3.7 g/dL (ref 3.5–5.0)
Alkaline Phosphatase: 66 U/L (ref 38–126)
Anion gap: 7 (ref 5–15)
BUN: 17 mg/dL (ref 8–23)
CO2: 26 mmol/L (ref 22–32)
Calcium: 9 mg/dL (ref 8.9–10.3)
Chloride: 106 mmol/L (ref 98–111)
Creatinine, Ser: 0.9 mg/dL (ref 0.44–1.00)
GFR, Estimated: >60 mL/min (ref >60)
Glucose, Bld: 91 mg/dL (ref 70–99)
Potassium: 4.6 mmol/L (ref 3.5–5.1)
Sodium: 139 mmol/L (ref 135–145)
Total Bilirubin: 0.6 mg/dL (ref 0.3–1.2)
Total Protein: 6.8 g/dL (ref 6.5–8.1)

## 2020-12-19 LAB — BRAIN NATRIURETIC PEPTIDE: B Natriuretic Peptide: 184.7 pg/mL — ABNORMAL HIGH (ref 0.0–100.0)

## 2020-12-19 MED ORDER — HYDRALAZINE HCL 25 MG PO TABS
50.0000 mg | ORAL_TABLET | Freq: Once | ORAL | Status: AC
  Administered 2020-12-20: 50 mg via ORAL
  Filled 2020-12-19: qty 2

## 2020-12-19 NOTE — ED Provider Notes (Signed)
Emergency Medicine Provider Triage Evaluation Note  Michelle [name] , a 75 y.o. female  was evaluated in triage.  Pt complains of dyspnea that has been progressively worsening over the course of a few months.  Worse with exertion.  Denies orthopnea or weight gain.  The past couple of days she has been feeling terrible, weak and fatigued.  States that she had been seen by cardiologist Dr. Johnsie Cancel in the past.  She also endorses bilateral lumbar region aches, worse with movement.  Atraumatic.  Most recent bowel movement this morning, nonbloody.  Review of Systems  Positive: Exertional dyspnea.  Fatigue and weakness.  Low back discomfort. Negative: Chest pain, palpitations, cough, fevers or chills, weight gain, urinary symptoms, orthopnea.  Physical Exam  BP (!) 180/103 (BP Location: Left Arm)   Pulse 60   Temp 97.9 F (36.6 C) (Oral)   Resp 18   SpO2 95%  Gen:   Awake, no distress   Resp:  Normal effort  MSK:   Moves extremities without difficulty  Other:   Medical Decision Making  Medically screening exam initiated at 1:08 PM.  Appropriate orders placed.  Joni Fears was informed that the remainder of the evaluation will be completed by another provider, this initial triage assessment does not replace that evaluation, and the importance of remaining in the ED until their evaluation is complete.    Corena Herter, PA-C 12/19/20 1308    Fredia Sorrow, MD 12/27/20 1349

## 2020-12-19 NOTE — ED Provider Notes (Signed)
[facility] EMERGENCY DEPARTMENT Provider Note   CSN: [id number] Arrival date & time: 12/19/20  1225     History Chief complaint: Shortness of breath  Michelle [name] is a 75 y.o. female.  The history is provided by the patient.  She has history of hypertension, hyperlipidemia, peripheral edema and comes in with exertional dyspnea over the last week.  She notices that she has been getting short of breath with relatively modest exertion but she denies orthopnea or paroxysmal nocturnal dyspnea.  She denies any cough or fever or chills.  She has not noticed any leg swelling.  She went to an urgent care center where she was told that she had fluid around her heart and had to come to the hospital.  Of note, she does take furosemide 20 mg daily.  In the past, she had been on 40 mg daily but had to cut back because she got too dehydrated.  She denies any chest pain, heaviness, tightness, pressure.   Past Medical History:  Diagnosis Date   Arthritis    R knee and Left  foot   Asthma    Cancer ([place])    Melanoma on back- Stage 1   Disorders of bursae and tendons in shoulder region, unspecified    Endometrial hyperplasia, unspecified    Family history of malignant neoplasm of gastrointestinal tract    GERD (gastroesophageal reflux disease)    takes Protonix daily   History of bronchitis 06/2012   Hyperlipidemia    takes Pravastatin daily   Hypertension    takes Enalapril daily   Insomnia    takes Restoril nightly   Melanoma of skin, site unspecified    Muscle spasms of head and/or neck    takes Zanaflex daily prn   Nocturia    Obesity    Osteoporosis    takes Fosamax weekly on Sundays   Other malaise and fatigue    Pericardial effusion 05/06/2018   Peripheral edema    takes Lasix daily   Pneumonia    Unspecified hypothyroidism    Unspecified vitamin D deficiency    Weakness    numbness,tingling,pain left arm    Patient Active Problem List   Diagnosis Date  Noted   Pericardial effusion without cardiac tamponade 05/06/2018   Obesity, Class I, BMI 30-34.9 07/24/2013   Left cervical radiculopathy 11/11/2012   Anemia 08/05/2011   Routine health maintenance 08/05/2011   CAROTID ARTERY DISEASE 01/08/2010   ENDOMETRIAL HYPERPLASIA UNSPECIFIED 05/21/2009   HYPERLIPIDEMIA 07/27/2007   HYPERTENSION 07/27/2007   GERD 07/27/2007    Past Surgical History:  Procedure Laterality Date   COLONOSCOPY     COLONOSCOPY N/A 01/30/2020   Procedure: COLONOSCOPY;  Surgeon: Lesly Rubenstein, MD;  Location: [facility] ENDOSCOPY;  Service: Endoscopy;  Laterality: N/A;   ESOPHAGOGASTRODUODENOSCOPY     ESOPHAGOGASTRODUODENOSCOPY N/A 01/30/2020   Procedure: ESOPHAGOGASTRODUODENOSCOPY (EGD);  Surgeon: Lesly Rubenstein, MD;  Location: [facility] ENDOSCOPY;  Service: Endoscopy;  Laterality: N/A;   JOINT REPLACEMENT     KNEE ARTHROSCOPY  2012   MELANOMA EXCISION  2006   near scapula   POSTERIOR CERVICAL FUSION/FORAMINOTOMY Left 12/08/2012   Procedure: Left C/7-T/1 Posterior Cervicothoracic laminotomy/foraminotomy; C/7-T/1 Posterior Cervical Arthrodesis ;  Surgeon: Hosie Spangle, MD;  Location: [place] NEURO ORS;  Service: Neurosurgery;  Laterality: Left;  Left cervical seven-thoracic one posterior cervicothoracic laminectomy and foraminotomy, cervical seven-thoracic one posterior arthrodesis    ROTATOR CUFF REPAIR  2010   Left   SPINE SURGERY  June/2014   TOTAL KNEE ARTHROPLASTY  07/07/2011   Procedure: TOTAL KNEE ARTHROPLASTY;  Surgeon: Rudean Haskell, MD;  Location: [place];  Service: Orthopedics;  Laterality: Right;   TUBAL LIGATION     TUMOR EXCISION  2008   endometrial   TUMOR REMOVAL  2009   From uterus     OB History   No obstetric history on file.     Family History  Problem Relation Age of Onset   Emphysema Father        deceased at 39   Heart failure Father        CHF   Heart disease Father    Hypertension Mother        deceased at 57   Other Mother         DJD   Heart disease Mother    Heart disease Sister        deceased   Arthritis Sister        x2   Drug abuse Sister        deceased; drug overdose   Hypertension Sister    Thyroid disease Sister    Hypertension Brother    Hyperlipidemia Brother    Heart attack Paternal Grandmother    Heart attack Paternal Uncle    Coronary artery disease Other        Strong Hx paternal kinship   Pancreatitis Brother        Died in 08/13/13   Breast cancer Neg Hx    Ovarian cancer Neg Hx    Colon cancer Neg Hx    Colon polyps Neg Hx    Esophageal cancer Neg Hx     Social History   Tobacco Use   Smoking status: Never   Smokeless tobacco: Never  Vaping Use   Vaping Use: Never used  Substance Use Topics   Alcohol use: Yes    Alcohol/week: 4.0 standard drinks    Types: 2 Glasses of wine, 2 Cans of beer per week    Comment: Occassionally   Drug use: No    Home Medications Prior to Admission medications   Medication Sig Start Date End Date Taking? Authorizing Provider  alendronate (FOSAMAX) 70 MG tablet TAKE 1 TABLET EVERY 7 DAYS WITH A FULL GLASS OF WATER ON AN EMPTY STOMACH. TAKE ON SUNDAY 06/25/13   Norins, Heinz Knuckles, MD  Ascorbic Acid (VITAMIN C PO) Take 1 tablet by mouth daily.    [provider]  Aspirin (ANACIN PO) Take 2 tablets by mouth daily as needed (for pain).    [provider]  CALCIUM PO Take 1 tablet by mouth daily.    [provider]  cholecalciferol (VITAMIN D) 1000 UNITS tablet Take 1,000 Units by mouth daily.    [provider]  furosemide (LASIX) 40 MG tablet TAKE 1/2 TABLET BY MOUTH DAILY 08/09/13   Norins, Heinz Knuckles, MD  losartan (COZAAR) 100 MG tablet Take 100 mg by mouth daily. Patient not taking: Reported on 01/30/2020    [provider]  MAGNESIUM PO Take 1 capsule by mouth daily.    [provider]  montelukast (SINGULAIR) 10 MG tablet Take 10 mg by mouth at bedtime.    [provider]  Nebivolol  HCl (BYSTOLIC) 20 MG TABS Take 20 mg by mouth daily.    [provider]  olmesartan (BENICAR) 40 MG tablet Take 40 mg by mouth daily.    [provider]  omeprazole (PRILOSEC) 40 MG  capsule Take 40 mg by mouth daily.    [provider]  oxyCODONE-acetaminophen (PERCOCET/ROXICET) 5-325 MG tablet Take 1 tablet by mouth at bedtime as needed for severe pain.    [provider]  pravastatin (PRAVACHOL) 40 MG tablet Take 40 mg by mouth at bedtime.     [provider]  temazepam (RESTORIL) 30 MG capsule Take 1 capsule (30 mg total) by mouth at bedtime as needed for sleep. 10/17/13   Rowe Clack, MD  VITAMIN E PO Take 1 capsule by mouth daily.    [provider]    Allergies    Patient has no known allergies.  Review of Systems   Review of Systems  All other systems reviewed and are negative.  Physical Exam Updated Vital Signs BP (!) 220/104 (BP Location: Left Arm)   Pulse 61   Temp 97.7 F (36.5 C) (Oral)   Resp 18   SpO2 99%   Physical Exam Vitals and nursing note reviewed.  75 year old female, resting comfortably and in no acute distress. Vital signs are significant for elevated blood pressure. Oxygen saturation is 99%, which is normal. Head is normocephalic and atraumatic. PERRLA, EOMI. Oropharynx is clear. Neck is nontender and supple without adenopathy or JVD. Back is nontender and there is no CVA tenderness. Lungs are clear without rales, wheezes, or rhonchi. Chest is nontender. Heart has regular rate and rhythm without murmur. Abdomen is soft, flat, nontender without masses or hepatosplenomegaly and peristalsis is normoactive. Extremities have 2+edema, full range of motion is present. Skin is warm and dry without rash. Neurologic: Mental status is normal, cranial nerves are intact, there are no motor or sensory deficits.  ED Results / Procedures / Treatments   Labs (all labs ordered are listed, but only abnormal  results are displayed) Labs Reviewed  CBC WITH DIFFERENTIAL/PLATELET - Abnormal; Notable for the following components:      Result Value   RBC 5.12 (*)    All other components within normal limits  BRAIN NATRIURETIC PEPTIDE - Abnormal; Notable for the following components:   B Natriuretic Peptide 184.7 (*)    All other components within normal limits  COMPREHENSIVE METABOLIC PANEL  URINALYSIS, ROUTINE W REFLEX MICROSCOPIC    EKG EKG Interpretation  Date/Time:  Wednesday December 19 2020 12:59:16 EDT Ventricular Rate:  60 PR Interval:  200 QRS Duration: 88 QT Interval:  400 QTC Calculation: 400 R Axis:   44 Text Interpretation: Sinus rhythm with Premature atrial complexes Otherwise normal ECG since last tracing no significant change Confirmed by Daleen Bo [phone]) on 12/19/2020 9:56:44 PM  Radiology DG Chest 2 View  Result Date: 12/19/2020 CLINICAL DATA:  Shortness of breath, hypertension EXAM: CHEST - 2 VIEW COMPARISON:  08/16/2019 FINDINGS: Cardiomegaly, stable. Atherosclerotic calcification of the aortic knob. No focal airspace consolidation, pleural effusion, or pneumothorax. Postsurgical changes within the lower cervical spine. IMPRESSION: No active cardiopulmonary disease. Electronically Signed   By: Davina Poke D.O.   On: 12/19/2020 14:21    Procedures Procedures   Medications Ordered in ED Medications  hydrALAZINE (APRESOLINE) tablet 50 mg (has no administration in time range)    ED Course  I have reviewed the triage vital signs and the nursing notes.  Pertinent labs & imaging results that were available during my care of the patient were reviewed by me and considered in my medical decision making (see chart for details).   MDM Rules/Calculators/A&P  Exertional dyspnea with presence of peripheral edema strongly suggestive of heart failure.  ECG is unchanged from baseline.  Chest x-ray does show stable cardiomegaly with no obvious  pulmonary vascular congestion.  Labs are significant for mildly elevated BNP, consistent with heart failure.  Blood pressure is significantly elevated today.  On review of past records, recent office visits have had normal blood pressure readings.  Patient states that blood pressure readings at home have been consistently elevated but she generally runs normal blood pressures when she sees her physician so they have not adjusted her blood pressure medications.  Renal function is normal, so do not believe this is a hypertensive urgency, but will give her a dose of hydralazine to try to reduce the blood pressure tonight before discharge.  Plan is to increase furosemide to 40 mg daily for the next 5 days and then follow-up with PCP to make appropriate adjustments to diuretic dose and blood pressure medication.  There is only a modest reduction in her blood pressure with hydralazine.  She is currently on maximum doses of nebivolol and losartan.  We will add calcium channel blocker.  She is discharged with prescription for amlodipine and with the above-noted change in her furosemide dose.  She will need to follow-up with her PCP following this for further medication dose adjustments.  Final Clinical Impression(s) / ED Diagnoses Final diagnoses:  Systolic heart failure, unspecified HF chronicity ([place])  Poorly-controlled hypertension    Rx / DC Orders ED Discharge Orders          Ordered    amLODipine (NORVASC) 5 MG tablet  Daily        12/20/20 9449             Delora Fuel, MD 67/59/16 0422

## 2020-12-19 NOTE — ED Triage Notes (Signed)
Pt here from home with c/o sob and low back pain ,.no cp , n/v ., htn with hx of same

## 2020-12-20 MED ORDER — AMLODIPINE BESYLATE 5 MG PO TABS
5.0000 mg | ORAL_TABLET | Freq: Once | ORAL | Status: AC
  Administered 2020-12-20: 5 mg via ORAL
  Filled 2020-12-20: qty 1

## 2020-12-20 MED ORDER — AMLODIPINE BESYLATE 5 MG PO TABS: 5.0000 mg | ORAL_TABLET | Freq: Every day | ORAL | 0 refills | Status: DC

## 2020-12-20 NOTE — Discharge Instructions (Addendum)
Increase your furosemide to 1 full tablet (40 mg) each day for the next 5 days.  During that time, monitor your weight daily.  After about, I would like you to see either your primary care provider or your cardiologist for further instructions on how to adjust your furosemide dose.  Continue to check your blood pressure twice a day and keep a record of it.  When you see your primary care provider or your cardiologist, they may need to adjust your blood pressure medication depending on how well it is responding to the changes in your medication regimen.  Return if you are having any problems.

## 2020-12-20 NOTE — ED Notes (Signed)
Patient verbalizes understanding of discharge instructions. Opportunity for questioning and answers were provided. Armband removed by staff, pt discharged from ED ambulatory.   

## 2021-03-24 NOTE — Progress Notes (Signed)
Cardiology Office Note   Date:  03/24/2021   ID:  Michelle [name], DOB Feb 07, 1946, MRN [id number]  PCP:  Bernerd Limbo, MD  Cardiologist:   Jenkins Rouge, MD   No chief complaint on file.     History of Present Illness: Michelle [name] is a 75 y.o. female who presents for f/u pericardial effusion She has not been seen for 2.5 years  Seen in hospital consult 05/06/18  History of HTN, OSA, HLD, and murmur.  Presented with URI , fever with myalgias. CTA no PE but moderate pericardial effusion TTE reviewed from 05/06/18 with EF 60-65% moderate PE with no tamponade ESR elevated 72  C reactive 14.7 ANA negative TSH normal Rx with colchicine and NSAI's  F/U TTE 07/19/18 with resolution EF 55-60% trivial AR mild/moderate MR  Seen in ER 12/19/20 with progressive dyspnea CXR NAD BNP only 184 on chronic lasix for LE edema BP elevated and given hydralazine and told to increase lasix 40 mg for 3 days Also d/c with norvasc added   She has been very sedentary. She is big bosomed and short waisted and obese which leads to restrictive lung issues. No chest pain BP better with med adjustment Sedentary and does not watch diet particularly well Denies history of DVT pain in legs or pleurisy Non smoker Activity also limited by hip pain seeing Lucy for left hip next week     Past Medical History:  Diagnosis Date   Arthritis    R knee and Left  foot   Asthma    Cancer ([place])    Melanoma on back- Stage 1   Disorders of bursae and tendons in shoulder region, unspecified    Endometrial hyperplasia, unspecified    Family history of malignant neoplasm of gastrointestinal tract    GERD (gastroesophageal reflux disease)    takes Protonix daily   History of bronchitis 06/2012   Hyperlipidemia    takes Pravastatin daily   Hypertension    takes Enalapril daily   Insomnia    takes Restoril nightly   Melanoma of skin, site unspecified    Muscle spasms of head and/or neck    takes Zanaflex daily prn   Nocturia     Obesity    Osteoporosis    takes Fosamax weekly on Sundays   Other malaise and fatigue    Pericardial effusion 05/06/2018   Peripheral edema    takes Lasix daily   Pneumonia    Unspecified hypothyroidism    Unspecified vitamin D deficiency    Weakness    numbness,tingling,pain left arm    Past Surgical History:  Procedure Laterality Date   COLONOSCOPY     COLONOSCOPY N/A 01/30/2020   Procedure: COLONOSCOPY;  Surgeon: Lesly Rubenstein, MD;  Location: [facility] ENDOSCOPY;  Service: Endoscopy;  Laterality: N/A;   ESOPHAGOGASTRODUODENOSCOPY     ESOPHAGOGASTRODUODENOSCOPY N/A 01/30/2020   Procedure: ESOPHAGOGASTRODUODENOSCOPY (EGD);  Surgeon: Lesly Rubenstein, MD;  [facility]: [facility] ENDOSCOPY;  Service: Endoscopy;  Laterality: N/A;   JOINT REPLACEMENT     KNEE ARTHROSCOPY  2012   MELANOMA EXCISION  2006   near scapula   POSTERIOR CERVICAL FUSION/FORAMINOTOMY Left 12/08/2012   Procedure: Left C/7-T/1 Posterior Cervicothoracic laminotomy/foraminotomy; C/7-T/1 Posterior Cervical Arthrodesis ;  Surgeon: Hosie Spangle, MD;  Location: [place] NEURO ORS;  Service: Neurosurgery;  Laterality: Left;  Left cervical seven-thoracic one posterior cervicothoracic laminectomy and foraminotomy, cervical seven-thoracic one posterior arthrodesis    ROTATOR CUFF REPAIR  2010   Left  SPINE SURGERY  June/2014   TOTAL KNEE ARTHROPLASTY  07/07/2011   Procedure: TOTAL KNEE ARTHROPLASTY;  Surgeon: Rudean Haskell, MD;  Location: [place];  Service: Orthopedics;  Laterality: Right;   TUBAL LIGATION     TUMOR EXCISION  2008   endometrial   TUMOR REMOVAL  2009   From uterus     Current Outpatient Medications  Medication Sig Dispense Refill   acetaminophen (TYLENOL) 500 MG tablet Take 1,000 mg by mouth every 6 (six) hours as needed for moderate pain or headache.     alendronate (FOSAMAX) 70 MG tablet TAKE 1 TABLET EVERY 7 DAYS WITH A FULL GLASS OF WATER ON AN EMPTY STOMACH. TAKE ON SUNDAY (Patient not taking: No sig  reported) 4 tablet 11   amLODipine (NORVASC) 5 MG tablet Take 1 tablet (5 mg total) by mouth daily. 30 tablet 0   Aspirin (ANACIN PO) Take 2 tablets by mouth daily as needed (for pain).     CALCIUM PO Take 1 tablet by mouth daily.     cholecalciferol (VITAMIN D) 1000 UNITS tablet Take 2,000 Units by mouth daily.     fluticasone (FLOVENT HFA) 110 MCG/ACT inhaler Inhale 1 puff into the lungs daily.     furosemide (LASIX) 40 MG tablet TAKE 1/2 TABLET BY MOUTH DAILY (Patient taking differently: Take 20 mg by mouth daily.) 30 tablet 10   ibandronate (BONIVA) 150 MG tablet Take 150 mg by mouth every 30 (thirty) days. Take in the morning with a full glass of water, on an empty stomach, and do not take anything else by mouth or lie down for the next 30 min.     losartan (COZAAR) 100 MG tablet Take 100 mg by mouth daily. (Patient not taking: No sig reported)     Nebivolol HCl 20 MG TABS Take 20 mg by mouth daily.     olmesartan (BENICAR) 40 MG tablet Take 40 mg by mouth daily.     omeprazole (PRILOSEC) 40 MG capsule Take 40 mg by mouth daily.     oxyCODONE-acetaminophen (PERCOCET/ROXICET) 5-325 MG tablet Take 0.25-0.5 tablets by mouth at bedtime as needed for severe pain.     pravastatin (PRAVACHOL) 40 MG tablet Take 40 mg by mouth at bedtime.      temazepam (RESTORIL) 30 MG capsule Take 1 capsule (30 mg total) by mouth at bedtime as needed for sleep. (Patient taking differently: Take 30 mg by mouth at bedtime.) 30 capsule 3   zinc gluconate 50 MG tablet Take 50 mg by mouth daily.     No current facility-administered medications for this visit.    Allergies:   Patient has no known allergies.    Social History:  The patient  reports that she has never smoked. She has never used smokeless tobacco. She reports current alcohol use of about 4.0 standard drinks per week. She reports that she does not use drugs.   Family History:  The patient's family history includes Arthritis in her sister; Coronary  artery disease in an other family member; Drug abuse in her sister; Emphysema in her father; Heart attack in her paternal grandmother and paternal uncle; Heart disease in her father, mother, and sister; Heart failure in her father; Hyperlipidemia in her brother; Hypertension in her brother, mother, and sister; Other in her mother; Pancreatitis in her brother; Thyroid disease in her sister.    ROS:  Please see the history of present illness.   Otherwise, review of systems are positive for none.  All other systems are reviewed and negative.    PHYSICAL EXAM: VS:  There were no vitals taken for this visit. , BMI There is no height or weight on file to calculate BMI. Affect appropriate Healthy:  appears stated age 63: normal Neck supple with no adenopathy JVP normal no bruits no thyromegaly Lungs clear with no wheezing and good diaphragmatic motion Heart:  S1/S2 no murmur, no rub, gallop or click PMI normal Abdomen: benighn, BS positve, no tenderness, no AAA no bruit.  No HSM or HJR Distal pulses intact with no bruits No edema Neuro non-focal Skin warm and dry No muscular weakness    EKG:  05/07/18 SR low voltage nonspecific ST changes    Recent Labs: 12/19/2020: ALT 20; B Natriuretic Peptide 184.7; BUN 17; Creatinine, Ser 0.90; Hemoglobin 13.7; Platelets 273; Potassium 4.6; Sodium 139    Lipid Panel    Component Value Date/Time   CHOL 237 (H) 07/21/2013 1433   TRIG 82.0 07/21/2013 1433   TRIG 114 07/08/2006 0918   HDL 50.00 07/21/2013 1433   CHOLHDL 5 07/21/2013 1433   VLDL 16.4 07/21/2013 1433   LDLCALC 116 (H) 07/07/2012 1448   LDLDIRECT 178.5 07/21/2013 1433      Wt Readings from Last 3 Encounters:  01/30/20 79.4 kg  07/12/18 80.7 kg  05/07/18 79.9 kg      Other studies Reviewed: Additional studies/ records that were reviewed today include: Notes from hospital November, CXR, CTA, TTE labs and consult note.    ASSESSMENT AND PLAN:  1.  Pericardial  Effusion:  TTE 07/19/18 resolved  2. HTN:  Improved with addition of Norvasc June 2022   3. HLD: on statin labs with primary  4. Dyspnea:  ER visit June 2022 BP meds adjusted mildly elevated BNP and CXR with stable CE. Will update echo given history of MR And pericardial effusion    Current medicines are reviewed at length with the patient today.  The patient does not have concerns regarding medicines.  The following changes have been made:  None   Labs/ tests ordered today include: TTE   No orders of the defined types were placed in this encounter.    Disposition:   FU with cardiology will depend on TTE results     Signed, Jenkins Rouge, MD  03/24/2021 1:16 PM    [address], [place], [place]  [address] Phone: [phone]; Fax: [phone]

## 2021-03-27 ENCOUNTER — Ambulatory Visit: Payer: Medicare Other | Admitting: Cardiovascular Disease

## 2021-03-27 ENCOUNTER — Other Ambulatory Visit: Payer: Self-pay

## 2021-03-27 ENCOUNTER — Encounter: Payer: Self-pay | Admitting: Cardiovascular Disease

## 2021-03-27 VITALS — BP 130/82 | HR 63 | Ht 59.0 in | Wt 188.0 lb

## 2021-03-27 DIAGNOSIS — I3139 Other pericardial effusion (noninflammatory): Secondary | ICD-10-CM

## 2021-03-27 DIAGNOSIS — I313 Pericardial effusion (noninflammatory): Secondary | ICD-10-CM | POA: Diagnosis not present

## 2021-03-27 DIAGNOSIS — I10 Essential (primary) hypertension: Secondary | ICD-10-CM

## 2021-03-27 DIAGNOSIS — R0602 Shortness of breath: Secondary | ICD-10-CM | POA: Diagnosis not present

## 2021-03-27 NOTE — Patient Instructions (Signed)
Medication Instructions:  No changes --losartan removed from medication list  *If you need a refill on your cardiac medications before your next appointment, please call your pharmacy*   Lab Work: none If you have labs (blood work) drawn today and your tests are completely normal, you will receive your results only by: [place] (if you have MyChart) OR A paper copy in the mail If you have any lab test that is abnormal or we need to change your treatment, we will call you to review the results.   Testing/Procedures: Your physician has requested that you have an echocardiogram. Echocardiography is a painless test that uses sound waves to create images of your heart. It provides your doctor with information about the size and shape of your heart and how well your heart's chambers and valves are working. This procedure takes approximately one hour. There are no restrictions for this procedure.   Follow-Up: At [facility], you and your health needs are our priority.  As part of our continuing mission to provide you with exceptional heart care, we have created designated Provider Care Teams.  These Care Teams include your primary Cardiologist (physician) and Advanced Practice Providers (APPs -  Physician Assistants and Nurse Practitioners) who all work together to provide you with the care you need, when you need it.   Your next appointment:   6 month(s)--if echo is okay  The format for your next appointment:   In Person  Provider:   You may see Jenkins Rouge, MD or one of the following Advanced Practice Providers on your designated Care Team:   Cecilie Kicks, NP    Other Instructions

## 2021-04-11 ENCOUNTER — Other Ambulatory Visit: Payer: Self-pay

## 2021-04-11 ENCOUNTER — Ambulatory Visit (HOSPITAL_COMMUNITY): Payer: Medicare Other | Attending: Cardiology

## 2021-04-11 DIAGNOSIS — R0602 Shortness of breath: Secondary | ICD-10-CM | POA: Diagnosis not present

## 2021-04-11 LAB — ECHOCARDIOGRAM COMPLETE
Area-P 1/2: 3.6 cm2
S' Lateral: 2.9 cm

## 2021-04-15 ENCOUNTER — Telehealth: Payer: Self-pay | Admitting: Cardiovascular Disease

## 2021-04-15 NOTE — Telephone Encounter (Signed)
Patient was returning a call to go over results from her Echo. Please call back

## 2021-04-15 NOTE — Telephone Encounter (Signed)
The patient has been notified of the Echo result and verbalized understanding.  All questions (if any) were answered. Darrell Jewel, RN 04/15/2021 8:53 AM

## 2021-04-16 NOTE — Progress Notes (Deleted)
Patient aware of results.

## 2021-09-04 NOTE — Progress Notes (Unsigned)
Cardiology Office Note   Date:  09/04/2021   ID:  REISE HIETALA, DOB 1945-11-03, MRN [id number]  PCP:  Bernerd Limbo, MD  Cardiologist:   Jenkins Rouge, MD   No chief complaint on file.      History of Present Illness: Michelle [name] is a 76 y.o. female who presents for f/u pericardial effusion and MR Seen in hospital consult 05/06/18  History of HTN, OSA, HLD, and murmur.  Presented with URI , fever with myalgias. CTA no PE but moderate pericardial effusion TTE reviewed from 05/06/18 with EF 60-65% moderate PE with no tamponade ESR elevated 72  C reactive 14.7 ANA negative TSH normal Rx with colchicine and NSAI's  F/U TTE 07/19/18 with resolution EF 55-60% trivial AR mild/moderate MR  Seen in ER 12/19/20 with progressive dyspnea CXR NAD BNP only 184 on chronic lasix for LE edema BP elevated and given hydralazine and told to increase lasix 40 mg for 3 days Also d/c with norvasc added   She has been very sedentary. She is big bosomed and short waisted and obese which leads to restrictive lung issues. No chest pain BP better with med adjustment Sedentary and does not watch diet particularly well on smoker Activity also limited by hip pain seeing Lucy for left hip next week   TTE 04/11/21 stable normal EF and mild/mod MR   ***    Past Medical History:  Diagnosis Date   Arthritis    R knee and Left  foot   Asthma    Cancer ([place])    Melanoma on back- Stage 1   Disorders of bursae and tendons in shoulder region, unspecified    Endometrial hyperplasia, unspecified    Family history of malignant neoplasm of gastrointestinal tract    GERD (gastroesophageal reflux disease)    takes Protonix daily   History of bronchitis 06/2012   Hyperlipidemia    takes Pravastatin daily   Hypertension    takes Enalapril daily   Insomnia    takes Restoril nightly   Melanoma of skin, site unspecified    Muscle spasms of head and/or neck    takes Zanaflex daily prn   Nocturia    Obesity     Osteoporosis    takes Fosamax weekly on Sundays   Other malaise and fatigue    Pericardial effusion 05/06/2018   Peripheral edema    takes Lasix daily   Pneumonia    Unspecified hypothyroidism    Unspecified vitamin D deficiency    Weakness    numbness,tingling,pain left arm    Past Surgical History:  Procedure Laterality Date   COLONOSCOPY     COLONOSCOPY N/A 01/30/2020   Procedure: COLONOSCOPY;  Surgeon: Lesly Rubenstein, MD;  Location: [facility] ENDOSCOPY;  Service: Endoscopy;  Laterality: N/A;   ESOPHAGOGASTRODUODENOSCOPY     ESOPHAGOGASTRODUODENOSCOPY N/A 01/30/2020   Procedure: ESOPHAGOGASTRODUODENOSCOPY (EGD);  Surgeon: Lesly Rubenstein, MD;  Location: [facility] ENDOSCOPY;  Service: Endoscopy;  Laterality: N/A;   JOINT REPLACEMENT     KNEE ARTHROSCOPY  2012   MELANOMA EXCISION  2006   near scapula   POSTERIOR CERVICAL FUSION/FORAMINOTOMY Left 12/08/2012   Procedure: Left C/7-T/1 Posterior Cervicothoracic laminotomy/foraminotomy; C/7-T/1 Posterior Cervical Arthrodesis ;  Surgeon: Hosie Spangle, MD;  Location: [facility];  Service: Neurosurgery;  Laterality: Left;  Left cervical seven-thoracic one posterior cervicothoracic laminectomy and foraminotomy, cervical seven-thoracic one posterior arthrodesis    ROTATOR CUFF REPAIR  2010   Left   SPINE SURGERY  June/2014   TOTAL KNEE ARTHROPLASTY  07/07/2011   Procedure: TOTAL KNEE ARTHROPLASTY;  Surgeon: Rudean Haskell, MD;  Location: [facility];  Service: Orthopedics;  Laterality: Right;   TUBAL LIGATION     TUMOR EXCISION  2008   endometrial   TUMOR REMOVAL  2009   From uterus     Current Outpatient Medications  Medication Sig Dispense Refill   acetaminophen (TYLENOL) 500 MG tablet Take 1,000 mg by mouth every 6 (six) hours as needed for moderate pain or headache.     alendronate (FOSAMAX) 70 MG tablet TAKE 1 TABLET EVERY 7 DAYS WITH A FULL GLASS OF WATER ON AN EMPTY STOMACH. TAKE ON SUNDAY (Patient not taking: Reported on  03/27/2021) 4 tablet 11   amLODipine (NORVASC) 5 MG tablet Take 1 tablet (5 mg total) by mouth daily. 30 tablet 0   Aspirin (ANACIN PO) Take 2 tablets by mouth daily as needed (for pain).     CALCIUM PO Take 1 tablet by mouth daily.     cholecalciferol (VITAMIN D) 1000 UNITS tablet Take 2,000 Units by mouth daily.     fluticasone (FLOVENT HFA) 110 MCG/ACT inhaler Inhale 1 puff into the lungs daily.     furosemide (LASIX) 40 MG tablet TAKE 1/2 TABLET BY MOUTH DAILY (Patient not taking: Reported on 03/27/2021) 30 tablet 10   furosemide (LASIX) 40 MG tablet Take 40 mg by mouth daily.     ibandronate (BONIVA) 150 MG tablet Take 150 mg by mouth every 30 (thirty) days. Take in the morning with a full glass of water, on an empty stomach, and do not take anything else by mouth or lie down for the next 30 min.     Nebivolol HCl 20 MG TABS Take 20 mg by mouth daily.     olmesartan (BENICAR) 40 MG tablet Take 40 mg by mouth daily.     omeprazole (PRILOSEC) 40 MG capsule Take 40 mg by mouth daily.     oxyCODONE-acetaminophen (PERCOCET/ROXICET) 5-325 MG tablet Take 0.25-0.5 tablets by mouth at bedtime as needed for severe pain.     pravastatin (PRAVACHOL) 40 MG tablet Take 40 mg by mouth at bedtime.      temazepam (RESTORIL) 30 MG capsule Take 1 capsule (30 mg total) by mouth at bedtime as needed for sleep. (Patient taking differently: Take 30 mg by mouth at bedtime.) 30 capsule 3   zinc gluconate 50 MG tablet Take 50 mg by mouth daily.     No current facility-administered medications for this visit.    Allergies:   Patient has no known allergies.    Social History:  The patient  reports that she has never smoked. She has never used smokeless tobacco. She reports current alcohol use of about 4.0 standard drinks per week. She reports that she does not use drugs.   Family History:  The patient's family history includes Arthritis in her sister; Coronary artery disease in an other family member; Drug abuse in  her sister; Emphysema in her father; Heart attack in her paternal grandmother and paternal uncle; Heart disease in her father, mother, and sister; Heart failure in her father; Hyperlipidemia in her brother; Hypertension in her brother, mother, and sister; Other in her mother; Pancreatitis in her brother; Thyroid disease in her sister.    ROS:  Please see the history of present illness.   Otherwise, review of systems are positive for none.   All other systems are reviewed and negative.    PHYSICAL  EXAM: VS:  There were no vitals taken for this visit. , BMI There is no height or weight on file to calculate BMI. Affect appropriate Healthy:  appears stated age 40: normal Neck supple with no adenopathy JVP normal no bruits no thyromegaly Lungs clear with no wheezing and good diaphragmatic motion Heart:  S1/S2 apical MR  murmur, no rub, gallop or click PMI normal Abdomen: benighn, BS positve, no tenderness, no AAA no bruit.  No HSM or HJR Distal pulses intact with no bruits No edema Neuro non-focal Skin warm and dry No muscular weakness    EKG:  05/07/18 SR low voltage nonspecific ST changes    Recent Labs: 12/19/2020: ALT 20; B Natriuretic Peptide 184.7; BUN 17; Creatinine, Ser 0.90; Hemoglobin 13.7; Platelets 273; Potassium 4.6; Sodium 139    Lipid Panel    Component Value Date/Time   CHOL 237 (H) 07/21/2013 1433   TRIG 82.0 07/21/2013 1433   TRIG 114 07/08/2006 0918   HDL 50.00 07/21/2013 1433   CHOLHDL 5 07/21/2013 1433   VLDL 16.4 07/21/2013 1433   LDLCALC 116 (H) 07/07/2012 1448   LDLDIRECT 178.5 07/21/2013 1433      Wt Readings from Last 3 Encounters:  03/27/21 188 lb (85.3 kg)  01/30/20 175 lb (79.4 kg)  07/12/18 178 lb (80.7 kg)      Other studies Reviewed: Additional studies/ records that were reviewed today include: Notes from hospital November, CXR, CTA, TTE labs and consult note.TTE 04/11/21     ASSESSMENT AND PLAN:  1.  Pericardial Effusion:  TTE  07/19/18 resolved  2. HTN:  Improved with addition of Norvasc June 2022   3. HLD: on statin labs with primary  4. Dyspnea:  ER visit June 2022 BP meds adjusted mildly elevated BNP and CXR with stable CE. Echo 04/11/21 stable mild/mod MR EF 60-65% normal strain -19.4    Current medicines are reviewed at length with the patient today.  The patient does not have concerns regarding medicines.   ***   Disposition:   FU with cardiology in a year     Signed, Jenkins Rouge, MD  09/04/2021 12:37 PM    [address], [place], [place]  [address] Phone: [phone]; Fax: [phone]

## 2021-09-11 ENCOUNTER — Other Ambulatory Visit: Payer: Self-pay | Admitting: Obstetrics and Gynecology

## 2021-09-11 DIAGNOSIS — Z1231 Encounter for screening mammogram for malignant neoplasm of breast: Secondary | ICD-10-CM

## 2021-09-18 ENCOUNTER — Ambulatory Visit: Payer: Medicare Other | Admitting: Cardiovascular Disease

## 2021-12-05 ENCOUNTER — Ambulatory Visit
Admission: RE | Admit: 2021-12-05 | Discharge: 2021-12-05 | Disposition: A | Discharge status: Home / Self Care | Payer: Medicare Other | Source: Ambulatory Visit | Attending: Obstetrics and Gynecology | Admitting: Obstetrics and Gynecology

## 2021-12-05 DIAGNOSIS — Z1231 Encounter for screening mammogram for malignant neoplasm of breast: Secondary | ICD-10-CM

## 2023-02-04 NOTE — Progress Notes (Signed)
Cardiology Office Note   Date:  02/09/2023   ID:  Michelle [name], DOB 21-Jul-1945, MRN [id number]  PCP:  Tracey Harries, MD  Cardiologist:   Charlton Haws, MD   No chief complaint on file.      History of Present Illness: Michelle [name] is a 77 y.o. female who presents for f/u pericardial effusion Seen in hospital consult 05/06/18  History of HTN, OSA, HLD, and murmur.  Presented with URI , fever with myalgias. CTA no PE but moderate pericardial effusion TTE reviewed from 05/06/18 with EF 60-65% moderate PE with no tamponade ESR elevated 72  C reactive 14.7 ANA negative TSH normal Rx with colchicine and NSAI's  F/U TTE 07/19/18 with resolution EF 55-60% trivial AR mild/moderate MR  Seen in ER 12/19/20 with progressive dyspnea CXR NAD BNP only 184 on chronic lasix for LE edema BP elevated and given hydralazine and told to increase lasix 40 mg for 3 days Also d/c with norvasc added   She has been very sedentary. She is big bosomed and short waisted and obese which leads to restrictive lung issues. No chest pain BP better with med adjustment Does not watch diet particularly well  Denies history of DVT pain in legs or pleurisy Non smoker Activity also limited by hip pain  with chronic exertional dyspnea   TTE 04/11/21 EF 60-65% normal strain mild.mod MR stable  CXR 12/19/20 NAD  She has fatigue and malaise. She is overweight. 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 [employer] now    Past Medical History:  Diagnosis Date   Arthritis    R knee and Left  foot   Asthma    Cancer ([facility])    Melanoma on back- Stage 1   Disorders of bursae and tendons in shoulder region, unspecified    Endometrial hyperplasia, unspecified    Family history of malignant neoplasm of gastrointestinal tract    GERD (gastroesophageal reflux disease)    takes  Protonix daily   History of bronchitis 06/2012   Hyperlipidemia    takes Pravastatin daily   Hypertension    takes Enalapril daily   Insomnia    takes Restoril nightly   Melanoma of skin, site unspecified    Muscle spasms of head and/or neck    takes Zanaflex daily prn   Nocturia    Obesity    Osteoporosis    takes Fosamax weekly on Sundays   Other malaise and fatigue    Pericardial effusion 05/06/2018   Peripheral edema    takes Lasix daily   Pneumonia    Unspecified hypothyroidism    Unspecified vitamin D deficiency    Weakness    numbness,tingling,pain left arm    Past Surgical History:  Procedure Laterality Date   COLONOSCOPY     COLONOSCOPY N/A 01/30/2020   Procedure: COLONOSCOPY;  Surgeon: Regis Bill, MD;  Location: [facility] ENDOSCOPY;  Service: Endoscopy;  Laterality: N/A;   ESOPHAGOGASTRODUODENOSCOPY     ESOPHAGOGASTRODUODENOSCOPY N/A 01/30/2020   Procedure: ESOPHAGOGASTRODUODENOSCOPY (EGD);  Surgeon: Regis Bill, MD;  [facility];  Service: Endoscopy;  Laterality: N/A;   JOINT REPLACEMENT     KNEE ARTHROSCOPY  2012   MELANOMA EXCISION  2006   near scapula   POSTERIOR CERVICAL FUSION/FORAMINOTOMY  Left 12/08/2012   Procedure: Left C/7-T/1 Posterior Cervicothoracic laminotomy/foraminotomy; C/7-T/1 Posterior Cervical Arthrodesis ;  Surgeon: Hewitt Shorts, MD;  Location: [facility];  Service: Neurosurgery;  Laterality: Left;  Left cervical seven-thoracic one posterior cervicothoracic laminectomy and foraminotomy, cervical seven-thoracic one posterior arthrodesis    ROTATOR CUFF REPAIR  2010   Left   SPINE SURGERY  June/2014   TOTAL KNEE ARTHROPLASTY  07/07/2011   Procedure: TOTAL KNEE ARTHROPLASTY;  Surgeon: Raymon Mutton, MD;  Location: [facility] OR;  Service: Orthopedics;  Laterality: Right;   TUBAL LIGATION     TUMOR EXCISION  2008   endometrial   TUMOR REMOVAL  2009   From uterus     Current Outpatient Medications  Medication Sig  Dispense Refill   CALCIUM PO Take 1 tablet by mouth daily.     furosemide (LASIX) 40 MG tablet Take 40 mg by mouth daily.     ibandronate (BONIVA) 150 MG tablet Take 150 mg by mouth every 30 (thirty) days. Take in the morning with a full glass of water, on an empty stomach, and do not take anything else by mouth or lie down for the next 30 min.     Magnesium 100 MG CAPS Take 100 mg by mouth daily.     Multiple Vitamin (MULTIVITAMIN) capsule Take 1 capsule by mouth daily.     Nebivolol HCl 20 MG TABS Take 20 mg by mouth daily.     olmesartan (BENICAR) 40 MG tablet Take 40 mg by mouth daily.     omeprazole (PRILOSEC) 40 MG capsule Take 40 mg by mouth daily.     oxyCODONE-acetaminophen (PERCOCET/ROXICET) 5-325 MG tablet Take 0.25-0.5 tablets by mouth at bedtime as needed for severe pain.     pravastatin (PRAVACHOL) 40 MG tablet Take 40 mg by mouth at bedtime.      temazepam (RESTORIL) 30 MG capsule Take 1 capsule (30 mg total) by mouth at bedtime as needed for sleep. (Patient taking differently: Take 30 mg by mouth at bedtime.) 30 capsule 3   acetaminophen (TYLENOL) 500 MG tablet Take 1,000 mg by mouth every 6 (six) hours as needed for moderate pain or headache.     alendronate (FOSAMAX) 70 MG tablet TAKE 1 TABLET EVERY 7 DAYS WITH A FULL GLASS OF WATER ON AN EMPTY STOMACH. TAKE ON SUNDAY (Patient not taking: Reported on 03/27/2021) 4 tablet 11   amLODipine (NORVASC) 5 MG tablet Take 1 tablet (5 mg total) by mouth daily. (Patient not taking: Reported on 02/09/2023) 30 tablet 0   Aspirin (ANACIN PO) Take 2 tablets by mouth daily as needed (for pain).     cholecalciferol (VITAMIN D) 1000 UNITS tablet Take 2,000 Units by mouth daily. (Patient not taking: Reported on 02/09/2023)     fluticasone (FLOVENT HFA) 110 MCG/ACT inhaler Inhale 1 puff into the lungs daily. (Patient not taking: Reported on 02/09/2023)     furosemide (LASIX) 40 MG tablet TAKE 1/2 TABLET BY MOUTH DAILY (Patient not taking: Reported on  03/27/2021) 30 tablet 10   zinc gluconate 50 MG tablet Take 50 mg by mouth daily. (Patient not taking: Reported on 02/09/2023)     No current facility-administered medications for this visit.    Allergies:   Patient has no known allergies.    Social History:  The patient  reports that she has never smoked. She has never used smokeless tobacco. She reports current alcohol use of about 4.0 standard drinks of alcohol per week. She reports that  she does not use drugs.   Family History:  The patient's family history includes Arthritis in her sister; Breast cancer in her daughter; Coronary artery disease in an other family member; Drug abuse in her sister; Emphysema in her father; Heart attack in her paternal grandmother and paternal uncle; Heart disease in her father, mother, and sister; Heart failure in her father; Hyperlipidemia in her brother; Hypertension in her brother, mother, and sister; Other in her mother; Pancreatitis in her brother; Thyroid disease in her sister.    ROS:  Please see the history of present illness.   Otherwise, review of systems are positive for none.   All other systems are reviewed and negative.    PHYSICAL EXAM: VS:  BP (!) 170/108   Pulse 60   Ht 4\' 11"  (1.499 m)   Wt 176 lb (79.8 kg)   BMI 35.55 kg/m  , BMI Body mass index is 35.55 kg/m. Affect appropriate Healthy:  appears stated age HEENT: normal Neck supple with no adenopathy JVP normal no bruits no thyromegaly Lungs clear with no wheezing and good diaphragmatic motion Heart:  S1/S2 no murmur, no rub, gallop or click PMI normal Abdomen: benighn, BS positve, no tenderness, no AAA no bruit.  No HSM or HJR Distal pulses intact with no bruits No edema Neuro non-focal Skin warm and dry No muscular weakness    EKG:  02/09/2023 NSR normal ECG     Recent Labs: No results found for requested labs within last 365 days.    Lipid Panel    Component Value Date/Time   CHOL 237 (H) 07/21/2013 1433    TRIG 82.0 07/21/2013 1433   TRIG 114 07/08/2006 0918   HDL 50.00 07/21/2013 1433   CHOLHDL 5 07/21/2013 1433   VLDL 16.4 07/21/2013 1433   LDLCALC 116 (H) 07/07/2012 1448   LDLDIRECT 178.5 07/21/2013 1433      Wt Readings from Last 3 Encounters:  02/09/23 176 lb (79.8 kg)  03/27/21 188 lb (85.3 kg)  01/30/20 175 lb (79.4 kg)      Other studies Reviewed: Additional studies/ records that were reviewed today include: Notes from hospital November, CXR, CTA, TTE labs and consult note.    ASSESSMENT AND PLAN:  1.  Pericardial Effusion:  TTE 07/19/18 resolved  2. HTN:  refer to HTN clinic continue current meds Decrease ETOH/salt intake  Renal duplex R/O RAS 3. HLD: on statin labs with primary  4. Dyspnea:  ER visit June 2022 BP meds adjusted mildly elevated BNP and CXR with stable CE. Will update echo given history of MR And pericardial effusion    Current medicines are reviewed at length with the patient today.  The patient does not have concerns regarding medicines.  The following changes have been made:  None   Labs/ tests ordered today include: TTE  Renal Duplex   Orders Placed This Encounter  Procedures   EKG 12-Lead      Disposition:   Refer to BP clinic f/u with me in a year     Signed, Charlton Haws, MD  02/09/2023 8:25 AM    [facility] [address] [place], [place], [place]  [address] Phone: [phone]; Fax: [phone]

## 2023-02-09 ENCOUNTER — Ambulatory Visit: Payer: Medicare Other | Attending: Cardiovascular Disease | Admitting: Cardiovascular Disease

## 2023-02-09 ENCOUNTER — Encounter: Payer: Self-pay | Admitting: Cardiovascular Disease

## 2023-02-09 VITALS — BP 160/102 | HR 60 | Ht 59.0 in | Wt 176.0 lb

## 2023-02-09 DIAGNOSIS — I340 Nonrheumatic mitral (valve) insufficiency: Secondary | ICD-10-CM

## 2023-02-09 DIAGNOSIS — I10 Essential (primary) hypertension: Secondary | ICD-10-CM

## 2023-02-09 DIAGNOSIS — I701 Atherosclerosis of renal artery: Secondary | ICD-10-CM | POA: Diagnosis not present

## 2023-02-09 NOTE — Patient Instructions (Signed)
Medication Instructions:  Your physician recommends that you continue on your current medications as directed. Please refer to the Current Medication list given to you today.   *If you need a refill on your cardiac medications before your next appointment, please call your pharmacy*  Lab Work: If you have labs (blood work) drawn today and your tests are completely normal, you will receive your results only by: MyChart Message (if you have MyChart) OR A paper copy in the mail If you have any lab test that is abnormal or we need to change your treatment, we will call you to review the results.  Testing/Procedures: Your physician has requested that you have an echocardiogram. Echocardiography is a painless test that uses sound waves to create images of your heart. It provides your doctor with information about the size and shape of your heart and how well your heart's chambers and valves are working. This procedure takes approximately one hour. There are no restrictions for this procedure. Please do NOT wear cologne, perfume, aftershave, or lotions (deodorant is allowed). Please arrive 15 minutes prior to your appointment time.  Your physician has requested that you have a renal artery duplex. During this test, an ultrasound is used to evaluate blood flow to the kidneys. Allow one hour for this exam. Do not eat after midnight the day before and avoid carbonated beverages. Take your medications as you usually do.  Follow-Up: At [facility], you and your health needs are our priority.  As part of our continuing mission to provide you with exceptional heart care, we have created designated Provider Care Teams.  These Care Teams include your primary Cardiologist (physician) and Advanced Practice Providers (APPs -  Physician Assistants and Nurse Practitioners) who all work together to provide you with the care you need, when you need it.  We recommend signing up for the patient portal called  "MyChart".  Sign up information is provided on this After Visit Summary.  MyChart is used to connect with patients for Virtual Visits (Telemedicine).  Patients are able to view lab/test results, encounter notes, upcoming appointments, etc.  Non-urgent messages can be sent to your provider as well.   To learn more about what you can do with MyChart, go to [email or web address].    Your next appointment:   1 year(s)  Provider:   Charlton Haws, MD     You have been referred to Hypertension Clinic.

## 2023-02-19 ENCOUNTER — Ambulatory Visit (HOSPITAL_COMMUNITY)
Admission: RE | Admit: 2023-02-19 | Discharge: 2023-02-19 | Disposition: A | Discharge status: Home / Self Care | Payer: Medicare Other | Source: Ambulatory Visit | Attending: Cardiovascular Disease | Admitting: Cardiovascular Disease

## 2023-02-19 DIAGNOSIS — I701 Atherosclerosis of renal artery: Secondary | ICD-10-CM | POA: Diagnosis present

## 2023-03-03 ENCOUNTER — Ambulatory Visit: Payer: Medicare Other | Attending: Cardiovascular Disease

## 2023-03-03 ENCOUNTER — Ambulatory Visit: Payer: Medicare Other | Admitting: Pharmacist

## 2023-03-03 VITALS — BP 190/100 | HR 58

## 2023-03-03 DIAGNOSIS — I10 Essential (primary) hypertension: Secondary | ICD-10-CM

## 2023-03-03 DIAGNOSIS — I340 Nonrheumatic mitral (valve) insufficiency: Secondary | ICD-10-CM | POA: Diagnosis present

## 2023-03-03 LAB — ECHOCARDIOGRAM COMPLETE
AR max vel: 1.95 cm2
AV Area VTI: 1.99 cm2
AV Area mean vel: 1.88 cm2
AV Mean grad: 6.5 mmHg
AV Peak grad: 11.7 mmHg
Ao pk vel: 1.71 m/s
Area-P 1/2: 4.89 cm2
MV M vel: 6.65 m/s
MV Peak grad: 177.1 mmHg
S' Lateral: 3 cm

## 2023-03-03 MED ORDER — AMLODIPINE BESYLATE 5 MG PO TABS: 5.0000 mg | ORAL_TABLET | Freq: Every day | ORAL | 3 refills | Status: DC

## 2023-03-03 NOTE — Assessment & Plan Note (Signed)
Assessment: Blood pressure significantly elevated in clinic today Home blood pressure readings are also elevated (but not quite to the same extreme) Patient has a wrist cuff.  She is using her right arm and is holding it in the air instead of resting it Diet high in sodium Limited exercise Biggest complaint is fatigue and weakness Heart rates in clinic was 58, at home appears to be in the 60s Not taking amlodipine, unsure why it was stopped or when  Plan: Start amlodipine 5 mg daily Continue furosemide 40 mg daily, olmesartan 40 mg daily and nebivolol 20 mg daily Make sure to rest arm when checking blood pressure and use left arm I have asked patient to bring in her blood pressure cuff to next visit Follow-up in 2 weeks Encouraged her to limit sodium to less than 2000 mg/day.  Recommended that she try tracking her sodium intake.  We reviewed how to read a nutrition label I have encouraged patient to walk daily (currently walking 2 to 3 days/week)

## 2023-03-03 NOTE — Patient Instructions (Addendum)
Your blood pressure goal is < 130/65mmHg   Start taking amlodipine 5mg  daily Continue furosemide 40mg  daily, olmesartan 40mg  daily, nebivolol 20mg  daily Try to walk at least 5 days a week Watch your sodium take. Pay attention to nutrition label. Try to stay under 2000 mg of sodium per day. It may be helpful to track for a few days Please bring in your blood pressure log AND blood pressure cuff to next visit  Important lifestyle changes to control high blood pressure  Intervention  Effect on the BP   Weight loss Weight loss is one of the most effective lifestyle changes for controlling blood pressure. If you're overweight or obese, losing even a small amount of weight can help reduce blood pressure.    Blood pressure can decrease by 1 millimeter of mercury (mmHg) with each kilogram (about 2.2 pounds) of weight lost.   Exercise regularly As a general goal, aim for 30 minutes of moderate physical activity every day.    Regular physical activity can lower blood pressure by 5 - 8 mmHg.   Eat a healthy diet Eat a diet rich in whole grains, fruits, vegetables, lean meat, and low-fat dairy products. Limit processed foods, saturated fat, and sweets.    A heart-healthy diet can lower high blood pressure by 10 mmHg.   Reduce salt (sodium) in your diet Aim for 000mg  of sodium each day. Avoid deli meats, canned food, and frozen microwave meals which are high in sodium.     Limiting sodium can reduce blood pressure by 5 mmHg.   Limit alcohol One drink equals 12 ounces of beer, 5 ounces of wine, or 1.5 ounces of 80-proof liquor.    Limiting alcohol to < 1 drink a day for women or < 2 drinks a day for men can help lower blood pressure by about 4 mmHg.   To check your pressure at home you will need to:   Sit up in a chair, with feet flat on the floor and back supported. Do not cross your ankles or legs. Rest your left arm so that the cuff is about heart level. If the cuff goes on your  upper arm, then just relax your arm on the table, arm of the chair, or your lap. If you have a wrist cuff, hold your wrist against your chest at heart level. Place the cuff snugly around your arm, about 1 inch above the crease of your elbow. The cords should be inside the groove of your elbow.  Sit quietly, with the cuff in place, for about 5 minutes. Then press the power button to start a reading. Do not talk or move while the reading is taking place.  Record your readings on a sheet of paper. Although most cuffs have a memory, it is often easier to see a pattern developing when the numbers are all in front of you.  You can repeat the reading after 1-3 minutes if it is recommended.   Make sure your bladder is empty and you have not had caffeine or tobacco within the last 30 minutes   Always bring your blood pressure log with you to your appointments. If you have not brought your monitor in to be double checked for accuracy, please bring it to your next appointment.   You can find a list of validated (accurate) blood pressure cuffs at: validatebp.org

## 2023-03-03 NOTE — Progress Notes (Signed)
Patient ID: Michelle [name]                 DOB: Dec 25, 1945                      MRN: [id number]      HPI: Michelle [name] is a 77 y.o. female referred by Dr. Eden Emms to HTN clinic. PMH is significant for HTN, OSA, HLD, murmur, pericardial effusion, obesity. Blood pressure appears to be a chronic issues. Seen last by Dr. Eden Emms 02/09/23. BP was 170/108. Renal artery duplex negative for renal artery stenosis.  Patient presents today hypertension clinic.  Upon review of med rec it appears patient is not taking amlodipine.  Unsure when or why it was stopped.  She reports headaches, issues with vision and fatigue.  Denies any swelling, takes furosemide 40 mg daily.  Patient reports sleeping about 9 hours/day however wakes up 3-4 times per night to use the bathroom.  We discussed restricting fluids 2 hours prior to bedtime.  She used to walk 1 mile however now she struggles to walk 1/8 of a mile, reporting that she feels tired and weak all the time.  She eats out often and does not pay much attention to her sodium intake.  Did not take her blood pressure medications this morning however she normally takes them between 930 and 10 AM and currently it is 9 AM.  Current HTN meds: furosemide 40mg  daily, olmesartan 40mg  daily, nebivolol 20mg  daily Previously tried: enalapril, losartan, diltiazem BP goal: <130/80  Family History: The patient's family history includes Arthritis in her sister; Breast cancer in her daughter; Coronary artery disease in an other family member; Drug abuse in her sister; Emphysema in her father; Heart attack in her paternal grandmother and paternal uncle; Heart disease in her father, mother, and sister; Heart failure in her father; Hyperlipidemia in her brother; Hypertension in her brother, mother, and sister; Other in her mother; Pancreatitis in her brother; Thyroid disease in her sister.   Social History: couple of beers Fri and Sat  Diet: eats out most of the time, [place], chilis,  pizza Breakfast: doesn't usually eat Sandwich with wheat bread- deli meat and cheese Drink: water, body armour, un-sweet tea  Exercise: walks 1/8 of a mile  Home BP readings:  Date SBP/DBP  HR  8/20 156/96   8/26 169/104   8/22 159/100    160/94   8/29 124/79   9/2 151/84       Average      Wt Readings from Last 3 Encounters:  02/09/23 176 lb (79.8 kg)  03/27/21 188 lb (85.3 kg)  01/30/20 175 lb (79.4 kg)   BP Readings from Last 3 Encounters:  03/03/23 (!) 190/100  02/09/23 (!) 160/102  03/27/21 130/82   Pulse Readings from Last 3 Encounters:  03/03/23 (!) 58  02/09/23 60  03/27/21 63    Renal function: CrCl cannot be calculated (Patient's most recent lab result is older than the maximum 21 days allowed.).  Past Medical History:  Diagnosis Date   Arthritis    R knee and Left  foot   Asthma    Cancer (HCC)    Melanoma on back- Stage 1   Disorders of bursae and tendons in shoulder region, unspecified    Endometrial hyperplasia, unspecified    Family history of malignant neoplasm of gastrointestinal tract    GERD (gastroesophageal reflux disease)    takes Protonix daily   History of bronchitis  06/2012   Hyperlipidemia    takes Pravastatin daily   Hypertension    takes Enalapril daily   Insomnia    takes Restoril nightly   Melanoma of skin, site unspecified    Muscle spasms of head and/or neck    takes Zanaflex daily prn   Nocturia    Obesity    Osteoporosis    takes Fosamax weekly on Sundays   Other malaise and fatigue    Pericardial effusion 05/06/2018   Peripheral edema    takes Lasix daily   Pneumonia    Unspecified hypothyroidism    Unspecified vitamin D deficiency    Weakness    numbness,tingling,pain left arm    Current Outpatient Medications on File Prior to Visit  Medication Sig Dispense Refill   acetaminophen (TYLENOL) 500 MG tablet Take 1,000 mg by mouth every 6 (six) hours as needed for moderate pain or headache.     albuterol  (VENTOLIN HFA) 108 (90 Base) MCG/ACT inhaler Inhale 2 puffs into the lungs every 6 (six) hours as needed.     Aspirin (ANACIN PO) Take 2 tablets by mouth daily as needed (for pain).     CALCIUM PO Take 1 tablet by mouth daily.     cholecalciferol (VITAMIN D) 1000 UNITS tablet Take 2,000 Units by mouth daily.     furosemide (LASIX) 40 MG tablet Take 40 mg by mouth daily.     ibandronate (BONIVA) 150 MG tablet Take 150 mg by mouth every 30 (thirty) days. Take in the morning with a full glass of water, on an empty stomach, and do not take anything else by mouth or lie down for the next 30 min.     Magnesium 100 MG CAPS Take 100 mg by mouth daily.     Multiple Vitamin (MULTIVITAMIN) capsule Take 1 capsule by mouth daily.     Nebivolol HCl 20 MG TABS Take 20 mg by mouth daily.     olmesartan (BENICAR) 40 MG tablet Take 40 mg by mouth daily.     omeprazole (PRILOSEC) 40 MG capsule Take 40 mg by mouth daily.     oxyCODONE-acetaminophen (PERCOCET/ROXICET) 5-325 MG tablet Take 0.25-0.5 tablets by mouth at bedtime as needed for severe pain.     pravastatin (PRAVACHOL) 40 MG tablet Take 40 mg by mouth at bedtime.      temazepam (RESTORIL) 30 MG capsule Take 1 capsule (30 mg total) by mouth at bedtime as needed for sleep. (Patient taking differently: Take 30 mg by mouth at bedtime.) 30 capsule 3   No current facility-administered medications on file prior to visit.    No Known Allergies  Blood pressure (!) 190/100, pulse (!) 58.   Assessment/Plan: HYPERTENSION CONTROL Vitals:   03/03/23 0903 03/03/23 0904  BP: (!) 196/102 (!) 190/100    The patient's blood pressure is elevated above target today.  In order to address the patient's elevated BP: A new medication was prescribed today.      1. Hypertension -  Essential hypertension Assessment: Blood pressure significantly elevated in clinic today Home blood pressure readings are also elevated (but not quite to the same extreme) Patient has a  wrist cuff.  She is using her right arm and is holding it in the air instead of resting it Diet high in sodium Limited exercise Biggest complaint is fatigue and weakness Heart rates in clinic was 58, at home appears to be in the 60s Not taking amlodipine, unsure why it was stopped or when  Plan: Start amlodipine 5 mg daily Continue furosemide 40 mg daily, olmesartan 40 mg daily and nebivolol 20 mg daily Make sure to rest arm when checking blood pressure and use left arm I have asked patient to bring in her blood pressure cuff to next visit Follow-up in 2 weeks Encouraged her to limit sodium to less than 2000 mg/day.  Recommended that she try tracking her sodium intake.  We reviewed how to read a nutrition label I have encouraged patient to walk daily (currently walking 2 to 3 days/week)   Thank you  Olene Floss, Pharm.D, BCACP, BCPS, CPP [facility] of [facility] [address], [place], [place] [address]  Phone: [phone]; Fax: [phone]

## 2023-03-04 ENCOUNTER — Telehealth: Payer: Self-pay

## 2023-03-04 DIAGNOSIS — R0602 Shortness of breath: Secondary | ICD-10-CM

## 2023-03-04 DIAGNOSIS — I340 Nonrheumatic mitral (valve) insufficiency: Secondary | ICD-10-CM

## 2023-03-04 NOTE — Telephone Encounter (Signed)
-----   Message from Charlton Haws sent at 03/04/2023 12:04 PM EDT ----- MR looks moderate to me worse than 2022 but EF normal need to r/o CAD with dyspnea as anginal equivalent order PET CT and f/u with me 6 months if normal

## 2023-03-04 NOTE — Telephone Encounter (Signed)
Patient called back. Informed patient of her results. Will place order for PET CT.

## 2023-03-04 NOTE — Telephone Encounter (Signed)
Left message for patient to call back  

## 2023-03-17 ENCOUNTER — Telehealth (HOSPITAL_COMMUNITY): Payer: Self-pay | Admitting: *Deleted

## 2023-03-17 ENCOUNTER — Ambulatory Visit: Payer: Medicare Other | Attending: Cardiovascular Disease | Admitting: Pharmacist

## 2023-03-17 ENCOUNTER — Telehealth: Payer: Self-pay

## 2023-03-17 VITALS — BP 122/72

## 2023-03-17 DIAGNOSIS — I340 Nonrheumatic mitral (valve) insufficiency: Secondary | ICD-10-CM

## 2023-03-17 DIAGNOSIS — I10 Essential (primary) hypertension: Secondary | ICD-10-CM

## 2023-03-17 DIAGNOSIS — R0602 Shortness of breath: Secondary | ICD-10-CM

## 2023-03-17 NOTE — Telephone Encounter (Signed)
Attempted to call patient regarding upcoming cardiac PET appointment. Left message on voicemail with name and callback number  Marloe Trudo RN Navigator Cardiac Imaging [facility] [phone] Office [phone] Cell  Reminder to hold caffeine 12 hours prior to her cardiac PET scan.

## 2023-03-17 NOTE — Assessment & Plan Note (Addendum)
Assessment: Blood pressure at goal in clinic today Home blood pressures average is above goal but was trending in right direction Limits her activity due to fatigue and back pain- encouraged her to keep moving/stretch that it will benefit her in the long run Has been cutting back on salt Tolerating medications well  Plan: Continue furosemide 40mg  daily, olmesartan 40mg  daily, nebivolol 20mg  daily, amlodipine 5 mg daily Continue to monitor BP at home Call me with readings in 2 weeks, if BP is borderline will increase amlodipine Increase exercise and continue to limit salt (sodium)

## 2023-03-17 NOTE — Patient Instructions (Addendum)
Your blood pressure goal is < 130/9mmHg   Continue furosemide 40mg  daily, olmesartan 40mg  daily, nebivolol 20mg  daily, amlodipine 5 mg daily  Continue to cut back on salt Please work on increasing physical activity - [facility] exercises classes  Please call me in 2 weeks with more blood pressure readings  Important lifestyle changes to control high blood pressure  Intervention  Effect on the BP   Weight loss Weight loss is one of the most effective lifestyle changes for controlling blood pressure. If you're overweight or obese, losing even a small amount of weight can help reduce blood pressure.    Blood pressure can decrease by 1 millimeter of mercury (mmHg) with each kilogram (about 2.2 pounds) of weight lost.   Exercise regularly As a general goal, aim for 30 minutes of moderate physical activity every day.    Regular physical activity can lower blood pressure by 5 - 8 mmHg.   Eat a healthy diet Eat a diet rich in whole grains, fruits, vegetables, lean meat, and low-fat dairy products. Limit processed foods, saturated fat, and sweets.    A heart-healthy diet can lower high blood pressure by 10 mmHg.   Reduce salt (sodium) in your diet Aim for 000mg  of sodium each day. Avoid deli meats, canned food, and frozen microwave meals which are high in sodium.     Limiting sodium can reduce blood pressure by 5 mmHg.   Limit alcohol One drink equals 12 ounces of beer, 5 ounces of wine, or 1.5 ounces of 80-proof liquor.    Limiting alcohol to < 1 drink a day for women or < 2 drinks a day for men can help lower blood pressure by about 4 mmHg.   To check your pressure at home you will need to:   Sit up in a chair, with feet flat on the floor and back supported. Do not cross your ankles or legs. Rest your left arm so that the cuff is about heart level. If the cuff goes on your upper arm, then just relax your arm on the table, arm of the  chair, or your lap. If you have a wrist cuff, hold your wrist against your chest at heart level. Place the cuff snugly around your arm, about 1 inch above the crease of your elbow. The cords should be inside the groove of your elbow.  Sit quietly, with the cuff in place, for about 5 minutes. Then press the power button to start a reading. Do not talk or move while the reading is taking place.  Record your readings on a sheet of paper. Although most cuffs have a memory, it is often easier to see a pattern developing when the numbers are all in front of you.  You can repeat the reading after 1-3 minutes if it is recommended.   Make sure your bladder is empty and you have not had caffeine or tobacco within the last 30 minutes   Always bring your blood pressure log with you to your appointments. If you have not brought your monitor in to be double checked for accuracy, please bring it to your next appointment.   You can find a list of validated (accurate) blood pressure cuffs at: validatebp.org

## 2023-03-17 NOTE — Progress Notes (Unsigned)
Patient ID: [name]                 DOB: [birth year]                      MRN: [id number]      HPI: Michelle [name] is a 77 y.o. female referred by Dr. Eden [name] to HTN clinic. PMH is significant for HTN, OSA, HLD, murmur, pericardial effusion, obesity. Blood pressure appears to be a chronic issues. Seen last by Dr. Eden [name] 02/09/23. BP was 170/108. Renal artery duplex negative for renal artery stenosis.  Patient last seen in hypertension clinic 03/03/2023.  Her blood pressure was significantly elevated at that visit.  She was checking her blood pressure at home with a wrist cuff, however using right arm and holding arm in air.  She reported struggling with fatigue and headaches.  Diet appeared to be high in sodium and I asked her to track her sodium for a few days to get an idea of how much sodium she is consuming.  We discussed limiting sodium to 2000 mg/day.  Somehow patient had stopped taking amlodipine therefore amlodipine 5 mg daily was resumed.  Patient presents today to HTN clinic. She brings in her home wrist cuff which was found to be accurate. Home BP readings varied. Average since last visit was 135/81 with more readings at goal in the last week. She has worked to decrease her sodium intake. Still not doing much exercise due to low back/hip pain. Previously seen PT and chiro but says it didn't help. She did not do her stretches outside of PT. Wears flip flops when she walks. She is peeing less frequently at night. Denies any dizziness or swelling. Sister just got diagnosed with pancreatic and liver cancer, only was given a few months to live.  Doesn't pee as much Home wrist cuff: right arm 139/81 Home wrist cuff: right arm 139/79 Clinic reading : 122/72 Home wrist cuff: right arm 129/66, 134/78 Clinic 130/68  Current HTN meds: furosemide 40mg  daily, olmesartan 40mg  daily, nebivolol 20mg  daily, amlodipine 5 mg daily Previously tried: enalapril, losartan, diltiazem BP goal:  <130/80  Family History: The patient's family history includes Arthritis in her sister; Breast cancer in her daughter; Coronary artery disease in an other family member; Drug abuse in her sister; Emphysema in her father; Heart attack in her paternal grandmother and paternal uncle; Heart disease in her father, mother, and sister; Heart failure in her father; Hyperlipidemia in her brother; Hypertension in her brother, mother, and sister; Other in her mother; Pancreatitis in her brother; Thyroid disease in her sister.   Social History: couple of beers Fri and Sat  Diet: eats out most of the time, [place], chilis, pizza Breakfast: doesn't usually eat Sandwich with wheat bread- deli meat and cheese Drink: water, body armour, un-sweet tea  Exercise: walks 1/8 of a mile  Home BP readings:  143 85  137 89  137 83  144 90  156 85  117 68  126 82  121 75  141 83  135 76  135.7 81.6  HR 58-65   Wt Readings from Last 3 Encounters:  02/09/23 176 lb (79.8 kg)  03/27/21 188 lb (85.3 kg)  01/30/20 175 lb (79.4 kg)   BP Readings from Last 3 Encounters:  03/17/23 122/72  03/03/23 (!) 190/100  02/09/23 (!) 160/102   Pulse Readings from Last 3 Encounters:  03/03/23 (!) 58  02/09/23 60  03/27/21 63  Renal function: CrCl cannot be calculated (Patient's most recent lab result is older than the maximum 21 days allowed.).  Past Medical History:  Diagnosis Date   Arthritis    R knee and Left  foot   Asthma    Cancer (HCC)    Melanoma on back- Stage 1   Disorders of bursae and tendons in shoulder region, unspecified    Endometrial hyperplasia, unspecified    Family history of malignant neoplasm of gastrointestinal tract    GERD (gastroesophageal reflux disease)    takes Protonix daily   History of bronchitis 06/2012   Hyperlipidemia    takes Pravastatin daily   Hypertension    takes Enalapril daily   Insomnia    takes Restoril nightly   Melanoma of skin, site unspecified     Muscle spasms of head and/or neck    takes Zanaflex daily prn   Nocturia    Obesity    Osteoporosis    takes Fosamax weekly on Sundays   Other malaise and fatigue    Pericardial effusion 05/06/2018   Peripheral edema    takes Lasix daily   Pneumonia    Unspecified hypothyroidism    Unspecified vitamin D deficiency    Weakness    numbness,tingling,pain left arm    Current Outpatient Medications on File Prior to Visit  Medication Sig Dispense Refill   acetaminophen (TYLENOL) 500 MG tablet Take 1,000 mg by mouth every 6 (six) hours as needed for moderate pain or headache.     albuterol (VENTOLIN HFA) 108 (90 Base) MCG/ACT inhaler Inhale 2 puffs into the lungs every 6 (six) hours as needed.     amLODipine (NORVASC) 5 MG tablet Take 1 tablet (5 mg total) by mouth daily. 90 tablet 3   Aspirin (ANACIN PO) Take 2 tablets by mouth daily as needed (for pain).     CALCIUM PO Take 1 tablet by mouth daily.     cholecalciferol (VITAMIN D) 1000 UNITS tablet Take 2,000 Units by mouth daily.     furosemide (LASIX) 40 MG tablet Take 40 mg by mouth daily.     ibandronate (BONIVA) 150 MG tablet Take 150 mg by mouth every 30 (thirty) days. Take in the morning with a full glass of water, on an empty stomach, and do not take anything else by mouth or lie down for the next 30 min.     Magnesium 100 MG CAPS Take 100 mg by mouth daily.     Multiple Vitamin (MULTIVITAMIN) capsule Take 1 capsule by mouth daily.     Nebivolol HCl 20 MG TABS Take 20 mg by mouth daily.     olmesartan (BENICAR) 40 MG tablet Take 40 mg by mouth daily.     omeprazole (PRILOSEC) 40 MG capsule Take 40 mg by mouth daily.     oxyCODONE-acetaminophen (PERCOCET/ROXICET) 5-325 MG tablet Take 0.25-0.5 tablets by mouth at bedtime as needed for severe pain.     pravastatin (PRAVACHOL) 40 MG tablet Take 40 mg by mouth at bedtime.      temazepam (RESTORIL) 30 MG capsule Take 1 capsule (30 mg total) by mouth at bedtime as needed for sleep.  (Patient taking differently: Take 30 mg by mouth at bedtime.) 30 capsule 3   No current facility-administered medications on file prior to visit.    No Known Allergies  Blood pressure 122/72.   Assessment/Plan:     1. Hypertension -  Essential hypertension Assessment: Blood pressure at goal in clinic today Home blood  pressures average is above goal but was trending in right direction Limits her activity due to fatigue and back pain- encouraged her to keep moving/stretch that it will benefit her in the long run Has been cutting back on salt Tolerating medications well  Plan: Continue furosemide 40mg  daily, olmesartan 40mg  daily, nebivolol 20mg  daily, amlodipine 5 mg daily Continue to monitor BP at home Call me with readings in 2 weeks, if BP is borderline will increase amlodipine Increase exercise and continue to limit salt (sodium)    Thank you  Olene Floss, Pharm.D, BCACP, BCPS, CPP  [facility] [address] [place], [place] [address]  Phone: [phone]; Fax: [phone]

## 2023-03-18 ENCOUNTER — Encounter (HOSPITAL_COMMUNITY)
Admission: RE | Admit: 2023-03-18 | Discharge: 2023-03-18 | Disposition: A | Discharge status: Home / Self Care | Payer: Medicare Other | Source: Ambulatory Visit | Attending: Cardiovascular Disease | Admitting: Cardiovascular Disease

## 2023-03-18 DIAGNOSIS — I340 Nonrheumatic mitral (valve) insufficiency: Secondary | ICD-10-CM | POA: Diagnosis present

## 2023-03-18 DIAGNOSIS — R0602 Shortness of breath: Secondary | ICD-10-CM | POA: Insufficient documentation

## 2023-03-18 LAB — NM PET CT CARDIAC PERFUSION MULTI W/ABSOLUTE BLOODFLOW
LV dias vol: 91 mL (ref 46–106)
LV sys vol: 41 mL
MBFR: 1.86
Nuc Rest EF: 55 %
Nuc Stress EF: 59 %
Peak HR: 81 {beats}/min
Rest HR: 63 {beats}/min
Rest MBF: 0.79 ml/g/min
Rest Nuclear Isotope Dose: 20.8 mCi
ST Depression (mm): 0 mm
Stress MBF: 1.47 ml/g/min
Stress Nuclear Isotope Dose: 20.7 mCi

## 2023-03-18 MED ORDER — RUBIDIUM RB82 GENERATOR (RUBYFILL)
20.7100 | PACK | Freq: Once | INTRAVENOUS | Status: AC
  Administered 2023-03-18: 20.71 via INTRAVENOUS

## 2023-03-18 MED ORDER — REGADENOSON 0.4 MG/5ML IV SOLN
0.4000 mg | Freq: Once | INTRAVENOUS | Status: AC
  Administered 2023-03-18: 0.4 mg via INTRAVENOUS

## 2023-03-18 MED ORDER — REGADENOSON 0.4 MG/5ML IV SOLN
INTRAVENOUS | Status: AC
  Filled 2023-03-18: qty 5

## 2023-03-18 MED ORDER — RUBIDIUM RB82 GENERATOR (RUBYFILL)
20.7500 | PACK | Freq: Once | INTRAVENOUS | Status: AC
  Administered 2023-03-18: 20.75 via INTRAVENOUS

## 2023-04-06 ENCOUNTER — Telehealth: Payer: Self-pay | Admitting: Pharmacist

## 2023-04-06 MED ORDER — AMLODIPINE BESYLATE 5 MG PO TABS: 7.5000 mg | ORAL_TABLET | Freq: Every day | ORAL | 3 refills | Status: DC

## 2023-04-06 NOTE — Telephone Encounter (Signed)
Spoke with patient about her blood pressure. She gave me some readings: 136/83, 134/88, 137/77, 135/75, 139/79, 135/74, 141/82, 142/85 Still above goal, so will increase amlodipine to 7.5mg  daily. Follow up in 4 weeks in clinic. Apt scheduled 11/4

## 2023-05-04 ENCOUNTER — Ambulatory Visit: Payer: Medicare Other | Attending: Cardiovascular Disease | Admitting: Pharmacist

## 2023-05-04 VITALS — BP 151/93 | HR 56

## 2023-05-04 DIAGNOSIS — I10 Essential (primary) hypertension: Secondary | ICD-10-CM | POA: Diagnosis not present

## 2023-05-04 NOTE — Patient Instructions (Addendum)
Your blood pressure goal is < 130/60mmHg   We will call you tomorrow with your lab results and discuss medication for your blood pressure  Please bring your list of blood pressure reading to next visit  Important lifestyle changes to control high blood pressure  Intervention  Effect on the BP   Weight loss Weight loss is one of the most effective lifestyle changes for controlling blood pressure. If you're overweight or obese, losing even a small amount of weight can help reduce blood pressure.    Blood pressure can decrease by 1 millimeter of mercury (mmHg) with each kilogram (about 2.2 pounds) of weight lost.   Exercise regularly As a general goal, aim for 30 minutes of moderate physical activity every day.    Regular physical activity can lower blood pressure by 5 - 8 mmHg.   Eat a healthy diet Eat a diet rich in whole grains, fruits, vegetables, lean meat, and low-fat dairy products. Limit processed foods, saturated fat, and sweets.    A heart-healthy diet can lower high blood pressure by 10 mmHg.   Reduce salt (sodium) in your diet Aim for 000mg  of sodium each day. Avoid deli meats, canned food, and frozen microwave meals which are high in sodium.     Limiting sodium can reduce blood pressure by 5 mmHg.   Limit alcohol One drink equals 12 ounces of beer, 5 ounces of wine, or 1.5 ounces of 80-proof liquor.    Limiting alcohol to < 1 drink a day for women or < 2 drinks a day for men can help lower blood pressure by about 4 mmHg.   To check your pressure at home you will need to:   Sit up in a chair, with feet flat on the floor and back supported. Do not cross your ankles or legs. Rest your left arm so that the cuff is about heart level. If the cuff goes on your upper arm, then just relax your arm on the table, arm of the chair, or your lap. If you have a wrist cuff, hold your wrist against your chest at heart level. Place the cuff snugly around your arm, about 1 inch  above the crease of your elbow. The cords should be inside the groove of your elbow.  Sit quietly, with the cuff in place, for about 5 minutes. Then press the power button to start a reading. Do not talk or move while the reading is taking place.  Record your readings on a sheet of paper. Although most cuffs have a memory, it is often easier to see a pattern developing when the numbers are all in front of you.  You can repeat the reading after 1-3 minutes if it is recommended.   Make sure your bladder is empty and you have not had caffeine or tobacco within the last 30 minutes   Always bring your blood pressure log with you to your appointments. If you have not brought your monitor in to be double checked for accuracy, please bring it to your next appointment.   You can find a list of validated (accurate) blood pressure cuffs at: validatebp.org

## 2023-05-04 NOTE — Assessment & Plan Note (Addendum)
Assessment BP remains elevated and uncontrolled above goal <130/80 mmHg.  Likely averaging 140-160s/90s since stopping amlodipine per clinic and home readings. Pt is unable to tolerate amlodipine with worsened LE edema. Appropriate to discontinue. Pt reports tolerating hydrochlorothiazide in the past when it was used for swelling.  Pt has not had a BMP since Apr 2024. No hx of hyponatremia or hypokalemia. Could consider initiating thiazide diuretic vs spironolactone pending electrolytes and kidney function on repeat BMP. If starting diuretic, can consider decreasing daily furosemide dose. If BMP precludes use of thiazide diuretic, can consider hydralazine  Plan Continue olmesartan 40 mg PO daily, nebivolol 20 mg PO daily, furosemide 40 mg PO daily Obtain BMP today to assess electrolytes and kidney function - will communicate medication plan via telephone Encouraged lifestyle interventions to help reduce BP including increasing physical activity as able and decreasing sodium intake Follow-up in 2 weeks

## 2023-05-04 NOTE — Progress Notes (Signed)
Patient ID: Michelle [name]                 DOB: April 26, 1946                      MRN: [id number]      HPI: Michelle [name] is a 77 y.o. female referred by Dr. Eden Emms to HTN clinic. PMH is significant for HTN, OSA, HLD, murmur, pericardial effusion in 2020, obesity. Blood pressure appears to be a chronic issues. Seen last by Dr. Eden Emms 02/09/23. BP was 170/108. Renal artery duplex negative for renal artery stenosis.  Patient last seen in hypertension clinic 03/03/2023.  Her blood pressure was significantly elevated at that visit.  She was checking her blood pressure at home with a wrist cuff, however using right arm and holding arm in air.  She reported struggling with fatigue and headaches.  Diet appeared to be high in sodium and I asked her to track her sodium for a few days to get an idea of how much sodium she is consuming.  We discussed limiting sodium to 2000 mg/day.  Resumed her amlodipine 5 mg PO daily.  Pt had f/u in HTN clinic on 03/17/23. She brought in her home wrist cuff which was found to be accurate. Home BP readings varied. Average since last visit was 135/81 with more readings at goal in the last week. She has worked to decrease her sodium intake. Still not doing much exercise due to low back/hip pain. Previously seen PT and chiro but says it didn't help. She did not do her stretches outside of PT. Wears flip flops when she walks. She is peeing less frequently at night. Denies any dizziness or swelling. Sister recently diagnosed with pancreatic and liver cancer, only was given a few months to live.  On 10/7 pt reported the following BP readings over the phone: 136/83, 134/88, 137/77, 135/75, 139/79, 135/74, 141/82, 142/85. Amlodipine was increased to 7.5 mg PO daily. Patient was seen in HTN clinic today and reports that she discontinued her amlodipine due to LE swelling. Swelling has resolved since stopping amlodipine. She has monitored her BP a couple times since stopping amlodipine - reports  variability with readings. She is still eating out, but does not add salt to her foods. Her mobility remains limited by pain (back, legs), and she often feels fatigued. Pt reports that she remembers taking hydrochlorothiazide in the past for edema - denies AE.  Current HTN meds: furosemide 40mg  daily, olmesartan 40mg  daily, nebivolol 20mg  daily Previously tried: enalapril, losartan, diltiazem BP goal: <130/80  Family History: The patient's family history includes Arthritis in her sister; Breast cancer in her daughter; Coronary artery disease in an other family member; Drug abuse in her sister; Emphysema in her father; Heart attack in her paternal grandmother and paternal uncle; Heart disease in her father, mother, and sister; Heart failure in her father; Hyperlipidemia in her brother; Hypertension in her brother, mother, and sister; Other in her mother; Pancreatitis in her brother; Thyroid disease in her sister.   Social History: couple of beers Fri and Sat  Diet: eats out most of the time, [place], chilis, pizza. She is not using a salt shaker, but still eating out occasionally. Breakfast: doesn't usually eat Sandwich with wheat bread- deli meat and cheese Drink: water, body armour, un-sweet tea  Exercise: walks 1/8 of a mile  Home BP readings: Checking BP after waking up in the morning. 134 74  131 74  153  87  147 99  146 90  134 81  166 91  134 81  Average: 143/84 HR 50-60s   Wt Readings from Last 3 Encounters:  02/09/23 176 lb (79.8 kg)  03/27/21 188 lb (85.3 kg)  01/30/20 175 lb (79.4 kg)   BP Readings from Last 3 Encounters:  05/04/23 (!) 151/93  03/18/23 (!) 145/74  03/17/23 122/72   Pulse Readings from Last 3 Encounters:  05/04/23 (!) 56  03/18/23 73  03/03/23 (!) 58    Renal function: CrCl cannot be calculated (Patient's most recent lab result is older than the maximum 21 days allowed.).  Past Medical History:  Diagnosis Date   Arthritis    R knee and Left   foot   Asthma    Cancer (HCC)    Melanoma on back- Stage 1   Disorders of bursae and tendons in shoulder region, unspecified    Endometrial hyperplasia, unspecified    Family history of malignant neoplasm of gastrointestinal tract    GERD (gastroesophageal reflux disease)    takes Protonix daily   History of bronchitis 06/2012   Hyperlipidemia    takes Pravastatin daily   Hypertension    takes Enalapril daily   Insomnia    takes Restoril nightly   Melanoma of skin, site unspecified    Muscle spasms of head and/or neck    takes Zanaflex daily prn   Nocturia    Obesity    Osteoporosis    takes Fosamax weekly on Sundays   Other malaise and fatigue    Pericardial effusion 05/06/2018   Peripheral edema    takes Lasix daily   Pneumonia    Unspecified hypothyroidism    Unspecified vitamin D deficiency    Weakness    numbness,tingling,pain left arm    Current Outpatient Medications on File Prior to Visit  Medication Sig Dispense Refill   cholecalciferol (VITAMIN D) 1000 UNITS tablet Take 2,000 Units by mouth daily.     furosemide (LASIX) 40 MG tablet Take 40 mg by mouth daily.     Magnesium 100 MG CAPS Take 100 mg by mouth daily.     Nebivolol HCl 20 MG TABS Take 20 mg by mouth daily.     olmesartan (BENICAR) 40 MG tablet Take 40 mg by mouth daily.     pravastatin (PRAVACHOL) 40 MG tablet Take 40 mg by mouth at bedtime.      acetaminophen (TYLENOL) 500 MG tablet Take 1,000 mg by mouth every 6 (six) hours as needed for moderate pain or headache.     albuterol (VENTOLIN HFA) 108 (90 Base) MCG/ACT inhaler Inhale 2 puffs into the lungs every 6 (six) hours as needed.     Aspirin (ANACIN PO) Take 2 tablets by mouth daily as needed (for pain).     CALCIUM PO Take 1 tablet by mouth daily.     ibandronate (BONIVA) 150 MG tablet Take 150 mg by mouth every 30 (thirty) days. Take in the morning with a full glass of water, on an empty stomach, and do not take anything else by mouth or lie  down for the next 30 min.     Multiple Vitamin (MULTIVITAMIN) capsule Take 1 capsule by mouth daily.     omeprazole (PRILOSEC) 40 MG capsule Take 40 mg by mouth daily.     oxyCODONE-acetaminophen (PERCOCET/ROXICET) 5-325 MG tablet Take 0.25-0.5 tablets by mouth at bedtime as needed for severe pain.     temazepam (RESTORIL) 30 MG capsule Take 1  capsule (30 mg total) by mouth at bedtime as needed for sleep. (Patient taking differently: Take 30 mg by mouth at bedtime.) 30 capsule 3   No current facility-administered medications on file prior to visit.    Allergies  Allergen Reactions   Amlodipine Other (See Comments)    Worsened LE edema/swelling on amlodipine 5 mg and amlodipine 7.5 mg    Blood pressure (!) 151/93, pulse (!) 56.   Assessment/Plan: HYPERTENSION CONTROL Vitals:   05/04/23 1107 05/04/23 1113  BP: (!) 142/94 (!) 151/93    The patient's blood pressure is elevated above target today.  In order to address the patient's elevated BP: Labs and/or other diagnostics are currently pending prior to making blood pressure medication adjustments.      1. Hypertension -  Essential hypertension Assessment BP remains elevated and uncontrolled above goal <130/80 mmHg.  Likely averaging 140-160s/90s since stopping amlodipine per clinic and home readings. Pt is unable to tolerate amlodipine with worsened LE edema. Appropriate to discontinue. Pt reports tolerating hydrochlorothiazide in the past when it was used for swelling.  Pt has not had a BMP since Apr 2024. No hx of hyponatremia or hypokalemia. Could consider initiating thiazide diuretic vs spironolactone pending electrolytes and kidney function on repeat BMP. If starting diuretic, can consider decreasing daily furosemide dose. If BMP precludes use of thiazide diuretic, can consider hydralazine  Plan Continue olmesartan 40 mg PO daily, nebivolol 20 mg PO daily, furosemide 40 mg PO daily Obtain BMP today to assess electrolytes  and kidney function - will communicate medication plan via telephone Encouraged lifestyle interventions to help reduce BP including increasing physical activity as able and decreasing sodium intake Follow-up in 2 weeks   Thank you  Nils Pyle, PharmD PGY1 Pharmacy Resident  Olene Floss, Pharm.D, BCACP, BCPS, CPP [facility] Division of [facility] [address] [place], [place] [address]  Phone: [phone]; Fax: [phone]

## 2023-05-05 ENCOUNTER — Telehealth: Payer: Self-pay | Admitting: Pharmacist

## 2023-05-05 LAB — BASIC METABOLIC PANEL
BUN/Creatinine Ratio: 21 (ref 12–28)
BUN: 19 mg/dL (ref 8–27)
CO2: 28 mmol/L (ref 20–29)
Calcium: 9.3 mg/dL (ref 8.7–10.3)
Chloride: 100 mmol/L (ref 96–106)
Creatinine, Ser: 0.92 mg/dL (ref 0.57–1.00)
Glucose: 94 mg/dL (ref 70–99)
Potassium: 4.8 mmol/L (ref 3.5–5.2)
Sodium: 143 mmol/L (ref 134–144)
eGFR: 63 mL/min/{1.73_m2} (ref >59)

## 2023-05-05 MED ORDER — CHLORTHALIDONE 25 MG PO TABS: 25.0000 mg | ORAL_TABLET | Freq: Every day | ORAL | 3 refills | Status: DC

## 2023-05-05 NOTE — Telephone Encounter (Signed)
BMP stable. K is 4.8. Will start chlorthalidone 25mg  daily. Decrease furosemide to 20mg  daily. Pt already scheduled f/u 11/18

## 2023-05-17 NOTE — Progress Notes (Unsigned)
Patient ID: [name]                 DOB: 02-Jun-1946                      MRN: [id number]      HPI: Michelle [name] is a 77 y.o. female referred by Dr. Eden [name] to HTN clinic. PMH is significant for HTN, OSA, HLD, murmur, pericardial effusion in 2020, obesity. Blood pressure appears to be a chronic issues. Seen last by Dr. Eden [name] 02/09/23. BP was 170/108. Renal artery duplex negative for renal artery stenosis.  Patient last seen in hypertension clinic 03/03/2023.  Her blood pressure was significantly elevated at that visit.  She was checking her blood pressure at home with a wrist cuff, however using right arm and holding arm in air.  She reported struggling with fatigue and headaches.  Diet appeared to be high in sodium and I asked her to track her sodium for a few days to get an idea of how much sodium she is consuming.  We discussed limiting sodium to 2000 mg/day.  Resumed her amlodipine 5 mg PO daily.  Pt had f/u in HTN clinic on 03/17/23. She brought in her home wrist cuff which was found to be accurate. Home BP readings varied. Average since last visit was 135/81 with more readings at goal in the last week. She has worked to decrease her sodium intake. Still not doing much exercise due to low back/hip pain. Previously seen PT and chiro but says it didn't help. She did not do her stretches outside of PT. Wears flip flops when she walks. She is peeing less frequently at night. Denies any dizziness or swelling. Sister recently diagnosed with pancreatic and liver cancer, only was given a few months to live.  On 10/7 pt reported the following BP readings over the phone: 136/83, 134/88, 137/77, 135/75, 139/79, 135/74, 141/82, 142/85. Amlodipine was increased to 7.5 mg PO daily. Patient was seen in HTN clinic for f/u on 05/04/23 - she had discontinued amlodipine due to LE swelling, which since resolved. She is still eating out, but does not add salt to her foods. After checking her BMP (K4.8, Scr 0.92)  chlorthalidone 25 mg PO daily was initiated, furosemide decreased to 20 mg PO daily.   Her mobility remains limited by pain (back, legs), and she often feels fatigued.   Current HTN meds: furosemide 20mg  daily, olmesartan 40mg  daily, nebivolol 20mg  daily, chlorthalidone 25 mg PO daily Previously tried: enalapril, losartan, diltiazem, amlodipine 5 mg and 7.5 mg (swelling) BP goal: <130/80  Family History: The patient's family history includes Arthritis in her sister; Breast cancer in her daughter; Coronary artery disease in an other family member; Drug abuse in her sister; Emphysema in her father; Heart attack in her paternal grandmother and paternal uncle; Heart disease in her father, mother, and sister; Heart failure in her father; Hyperlipidemia in her brother; Hypertension in her brother, mother, and sister; Other in her mother; Pancreatitis in her brother; Thyroid disease in her sister.   Social History: couple of beers Fri and Sat  Diet: eats out most of the time, [place], chilis, pizza. She is not using a salt shaker, but still eating out occasionally. Breakfast: doesn't usually eat Sandwich with wheat bread- deli meat and cheese Drink: water, body armour, un-sweet tea  Exercise: walks 1/8 of a mile  Home BP readings: Checking BP after waking up in the morning. 134 74  131  74  153 87  147 99  146 90  134 81  166 91  134 81  Average: 143/84 HR 50-60s   Wt Readings from Last 3 Encounters:  02/09/23 176 lb (79.8 kg)  03/27/21 188 lb (85.3 kg)  01/30/20 175 lb (79.4 kg)   BP Readings from Last 3 Encounters:  05/04/23 (!) 151/93  03/18/23 (!) 145/74  03/17/23 122/72   Pulse Readings from Last 3 Encounters:  05/04/23 (!) 56  03/18/23 73  03/03/23 (!) 58    Renal function: CrCl cannot be calculated (Unknown ideal weight.).  Past Medical History:  Diagnosis Date   Arthritis    R knee and Left  foot   Asthma    Cancer (HCC)    Melanoma on back- Stage 1    Disorders of bursae and tendons in shoulder region, unspecified    Endometrial hyperplasia, unspecified    Family history of malignant neoplasm of gastrointestinal tract    GERD (gastroesophageal reflux disease)    takes Protonix daily   History of bronchitis 06/2012   Hyperlipidemia    takes Pravastatin daily   Hypertension    takes Enalapril daily   Insomnia    takes Restoril nightly   Melanoma of skin, site unspecified    Muscle spasms of head and/or neck    takes Zanaflex daily prn   Nocturia    Obesity    Osteoporosis    takes Fosamax weekly on Sundays   Other malaise and fatigue    Pericardial effusion 05/06/2018   Peripheral edema    takes Lasix daily   Pneumonia    Unspecified hypothyroidism    Unspecified vitamin D deficiency    Weakness    numbness,tingling,pain left arm    Current Outpatient Medications on File Prior to Visit  Medication Sig Dispense Refill   acetaminophen (TYLENOL) 500 MG tablet Take 1,000 mg by mouth every 6 (six) hours as needed for moderate pain or headache.     albuterol (VENTOLIN HFA) 108 (90 Base) MCG/ACT inhaler Inhale 2 puffs into the lungs every 6 (six) hours as needed.     Aspirin (ANACIN PO) Take 2 tablets by mouth daily as needed (for pain).     CALCIUM PO Take 1 tablet by mouth daily.     chlorthalidone (HYGROTON) 25 MG tablet Take 1 tablet (25 mg total) by mouth daily. 90 tablet 3   cholecalciferol (VITAMIN D) 1000 UNITS tablet Take 2,000 Units by mouth daily.     furosemide (LASIX) 40 MG tablet Take 20 mg by mouth daily.     ibandronate (BONIVA) 150 MG tablet Take 150 mg by mouth every 30 (thirty) days. Take in the morning with a full glass of water, on an empty stomach, and do not take anything else by mouth or lie down for the next 30 min.     Magnesium 100 MG CAPS Take 100 mg by mouth daily.     Multiple Vitamin (MULTIVITAMIN) capsule Take 1 capsule by mouth daily.     Nebivolol HCl 20 MG TABS Take 20 mg by mouth daily.      olmesartan (BENICAR) 40 MG tablet Take 40 mg by mouth daily.     omeprazole (PRILOSEC) 40 MG capsule Take 40 mg by mouth daily.     oxyCODONE-acetaminophen (PERCOCET/ROXICET) 5-325 MG tablet Take 0.25-0.5 tablets by mouth at bedtime as needed for severe pain.     pravastatin (PRAVACHOL) 40 MG tablet Take 40 mg by mouth at bedtime.  temazepam (RESTORIL) 30 MG capsule Take 1 capsule (30 mg total) by mouth at bedtime as needed for sleep. (Patient taking differently: Take 30 mg by mouth at bedtime.) 30 capsule 3   No current facility-administered medications on file prior to visit.    Allergies  Allergen Reactions   Amlodipine Other (See Comments)    Worsened LE edema/swelling on amlodipine 5 mg and amlodipine 7.5 mg    There were no vitals taken for this visit.   Assessment/Plan: No BP recorded.  {Refresh Note OR Click here to enter BP  :1}***   1. Hypertension -  No problem-specific Assessment & Plan notes found for this encounter.  Repeat BMP after starting chlorthalidone (~12 days ago). Next steps if still uncontrolled; spironolactone vs hydralazine  Thank you  Nils Pyle, PharmD PGY1 Pharmacy Resident  Olene Floss, Pharm.D, BCACP, BCPS, CPP [facility] of [facility] [address], [place], [place] [address]  Phone: [phone]; Fax: [phone]

## 2023-05-18 ENCOUNTER — Ambulatory Visit: Payer: Medicare Other | Attending: Internal Medicine | Admitting: Pharmacist

## 2023-05-18 VITALS — BP 130/77

## 2023-05-18 DIAGNOSIS — I10 Essential (primary) hypertension: Secondary | ICD-10-CM | POA: Diagnosis not present

## 2023-05-18 LAB — BASIC METABOLIC PANEL
BUN/Creatinine Ratio: 31 — ABNORMAL HIGH (ref 12–28)
BUN: 38 mg/dL — ABNORMAL HIGH (ref 8–27)
CO2: 30 mmol/L — ABNORMAL HIGH (ref 20–29)
Calcium: 10.4 mg/dL — ABNORMAL HIGH (ref 8.7–10.3)
Chloride: 99 mmol/L (ref 96–106)
Creatinine, Ser: 1.23 mg/dL — ABNORMAL HIGH (ref 0.57–1.00)
Glucose: 96 mg/dL (ref 70–99)
Potassium: 4.5 mmol/L (ref 3.5–5.2)
Sodium: 142 mmol/L (ref 134–144)
eGFR: 45 mL/min/{1.73_m2} — ABNORMAL LOW (ref >59)

## 2023-05-18 NOTE — Patient Instructions (Signed)
It was great to see you today!  Please continue taking - olmesartan 40 mg by mouth daily (you can try taking this one at night time if you would like)  - nebivolol 20 mg by mouth daily (you can try taking this one at night time if you would like) - furosemide 20 mg by mouth daily - chlorthalidone 25 mg by mouth daily  If your blood pressure increases and is consistently higher than 130/80 mmHg please give [place] a call and we can schedule a follow-up appointment. A very high blood pressure which may require further testing would be a blood pressure higher than 180/110 mmHg.   Continue to stay hydrated, decrease sodium intake, and keep exercising!  Let us know if you have any questions or concerns!

## 2023-05-18 NOTE — Assessment & Plan Note (Signed)
Assessment - Average home BP 130/78 mmHg is very close to goal <130/80 mmHg- wrist cuff was found to be accurate at previous appt.  - Addition of chlorthalidone ~12 days ago has resulted in much improved blood pressures. Will check BMP today to confirm electrolytes and kidney function are stable.  - No LE edema on exam - reduced dose of furosemide is appropriate. - Given pt age, limited mobility, tolerability of current regimen, and occasional SBP 100-110s, will not add additional agent for BP control today. If BP control worsens, can consider addition of spironolactone with close monitoring, and further deescalation of furosemide - Reiterated importance of lifestyle interventions to assist with BP control including staying hydrated with water, increasing physical activity, and decreasing sodium intake.   Plan - Obtain repeat BMP today to monitor electrolytes and Scr after starting chlorthalidone. Will communicate results to pt via telephone - Continue olmesartan 40mg  daily, nebivolol 20mg  daily, chlorthalidone 25 mg PO daily, furosemide 20 mg daily - Instructed pt to continue efforts to increase physical activity, decrease sodium intake, and increase hydration - Instructed pt to call for follow-up if BP is consistently elevated >130/80 mmHg, otherwise follow-up with PCP in Jan 2025.

## 2023-05-19 ENCOUNTER — Telehealth: Payer: Self-pay

## 2023-05-19 DIAGNOSIS — I10 Essential (primary) hypertension: Secondary | ICD-10-CM

## 2023-05-19 NOTE — Telephone Encounter (Signed)
Attempted to outreach patient to review BMP from 05/18/23. LVM with PharmD callback number [phone].   Appears that patient is dehydrated since initiation of chlorthalidone. Will instruct patient to increase hydration with water and repeat BMP in ~ 1 week.   Nils Pyle, PharmD PGY1 Pharmacy Resident

## 2023-05-19 NOTE — Telephone Encounter (Signed)
Error

## 2023-05-19 NOTE — Addendum Note (Signed)
Addended by: Particia Lather on: 05/19/2023 07:03 PM   Modules accepted: Orders

## 2023-05-20 NOTE — Telephone Encounter (Signed)
Re-attempted follow-up via telephone at home # and mobile #. Left VM instructing patient to call back, and notified of MyChart message.   Nils Pyle, PharmD PGY1 Pharmacy Resident'

## 2023-05-20 NOTE — Telephone Encounter (Signed)
Spoke with patient. Reviewed her labs. Encouraged her to drink more water and go to [facility] in 2 weeks for repeat labs.

## 2023-06-05 ENCOUNTER — Telehealth: Payer: Self-pay | Admitting: Pharmacist

## 2023-06-05 NOTE — Telephone Encounter (Signed)
Called pt and reminded her to go to lab for blood work. [place] Spaniel she was on her way to [facility] and would probably go Monday.

## 2023-06-13 LAB — BASIC METABOLIC PANEL
BUN/Creatinine Ratio: 31 — ABNORMAL HIGH (ref 12–28)
BUN: 44 mg/dL — ABNORMAL HIGH (ref 8–27)
CO2: 24 mmol/L (ref 20–29)
Calcium: 9.8 mg/dL (ref 8.7–10.3)
Chloride: 97 mmol/L (ref 96–106)
Creatinine, Ser: 1.4 mg/dL — ABNORMAL HIGH (ref 0.57–1.00)
Glucose: 113 mg/dL — ABNORMAL HIGH (ref 70–99)
Potassium: 4.3 mmol/L (ref 3.5–5.2)
Sodium: 139 mmol/L (ref 134–144)
eGFR: 38 mL/min/{1.73_m2} — ABNORMAL LOW (ref >59)

## 2023-06-15 ENCOUNTER — Telehealth: Payer: Self-pay | Admitting: Pharmacist

## 2023-06-15 DIAGNOSIS — I10 Essential (primary) hypertension: Secondary | ICD-10-CM

## 2023-06-15 NOTE — Telephone Encounter (Signed)
Scr worse. BUN up and BUN/Scr high. Will STOP furosemide and recheck BMP on Thursday.

## 2023-06-19 LAB — BASIC METABOLIC PANEL
BUN/Creatinine Ratio: 27 (ref 12–28)
BUN: 30 mg/dL — ABNORMAL HIGH (ref 8–27)
CO2: 25 mmol/L (ref 20–29)
Calcium: 9.7 mg/dL (ref 8.7–10.3)
Chloride: 99 mmol/L (ref 96–106)
Creatinine, Ser: 1.13 mg/dL — ABNORMAL HIGH (ref 0.57–1.00)
Glucose: 105 mg/dL — ABNORMAL HIGH (ref 70–99)
Potassium: 4.3 mmol/L (ref 3.5–5.2)
Sodium: 139 mmol/L (ref 134–144)
eGFR: 49 mL/min/{1.73_m2} — ABNORMAL LOW (ref >59)

## 2024-04-28 ENCOUNTER — Other Ambulatory Visit: Payer: Self-pay | Admitting: Cardiovascular Disease

## 2024-06-15 ENCOUNTER — Other Ambulatory Visit: Payer: Self-pay | Admitting: Obstetrics and Gynecology

## 2024-06-15 DIAGNOSIS — Z1231 Encounter for screening mammogram for malignant neoplasm of breast: Secondary | ICD-10-CM

## 2024-06-28 ENCOUNTER — Other Ambulatory Visit: Payer: Self-pay | Admitting: Cardiovascular Disease

## 2024-07-22 ENCOUNTER — Other Ambulatory Visit: Payer: Self-pay

## 2024-07-26 ENCOUNTER — Other Ambulatory Visit: Payer: Self-pay | Admitting: Cardiovascular Disease

## 2024-07-29 MED ORDER — CHLORTHALIDONE 25 MG PO TABS: 25.0000 mg | ORAL_TABLET | Freq: Every day | ORAL | 0 refills | Status: AC

## 2024-08-10 ENCOUNTER — Encounter
